# Patient Record
Sex: Male | Born: 1995 | Race: White | Hispanic: No | Smoking: Current every day smoker
Health system: Southern US, Community
[De-identification: ages and names within clinical notes are randomized; demographics above are authoritative.]

## PROBLEM LIST (undated history)

## (undated) DIAGNOSIS — F419 Anxiety disorder, unspecified: Secondary | ICD-10-CM

## (undated) DIAGNOSIS — R011 Cardiac murmur, unspecified: Secondary | ICD-10-CM

## (undated) DIAGNOSIS — K859 Acute pancreatitis without necrosis or infection, unspecified: Secondary | ICD-10-CM

---
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
## 2013-11-11 IMAGING — CR DG CHEST 2V
2 series · 2 of 2 positions shown · non-contrast
Comparison: None.

CLINICAL DATA: Chest pain.  No known injury.

CHEST - 2 VIEW

[view not recorded (1 of 2)]
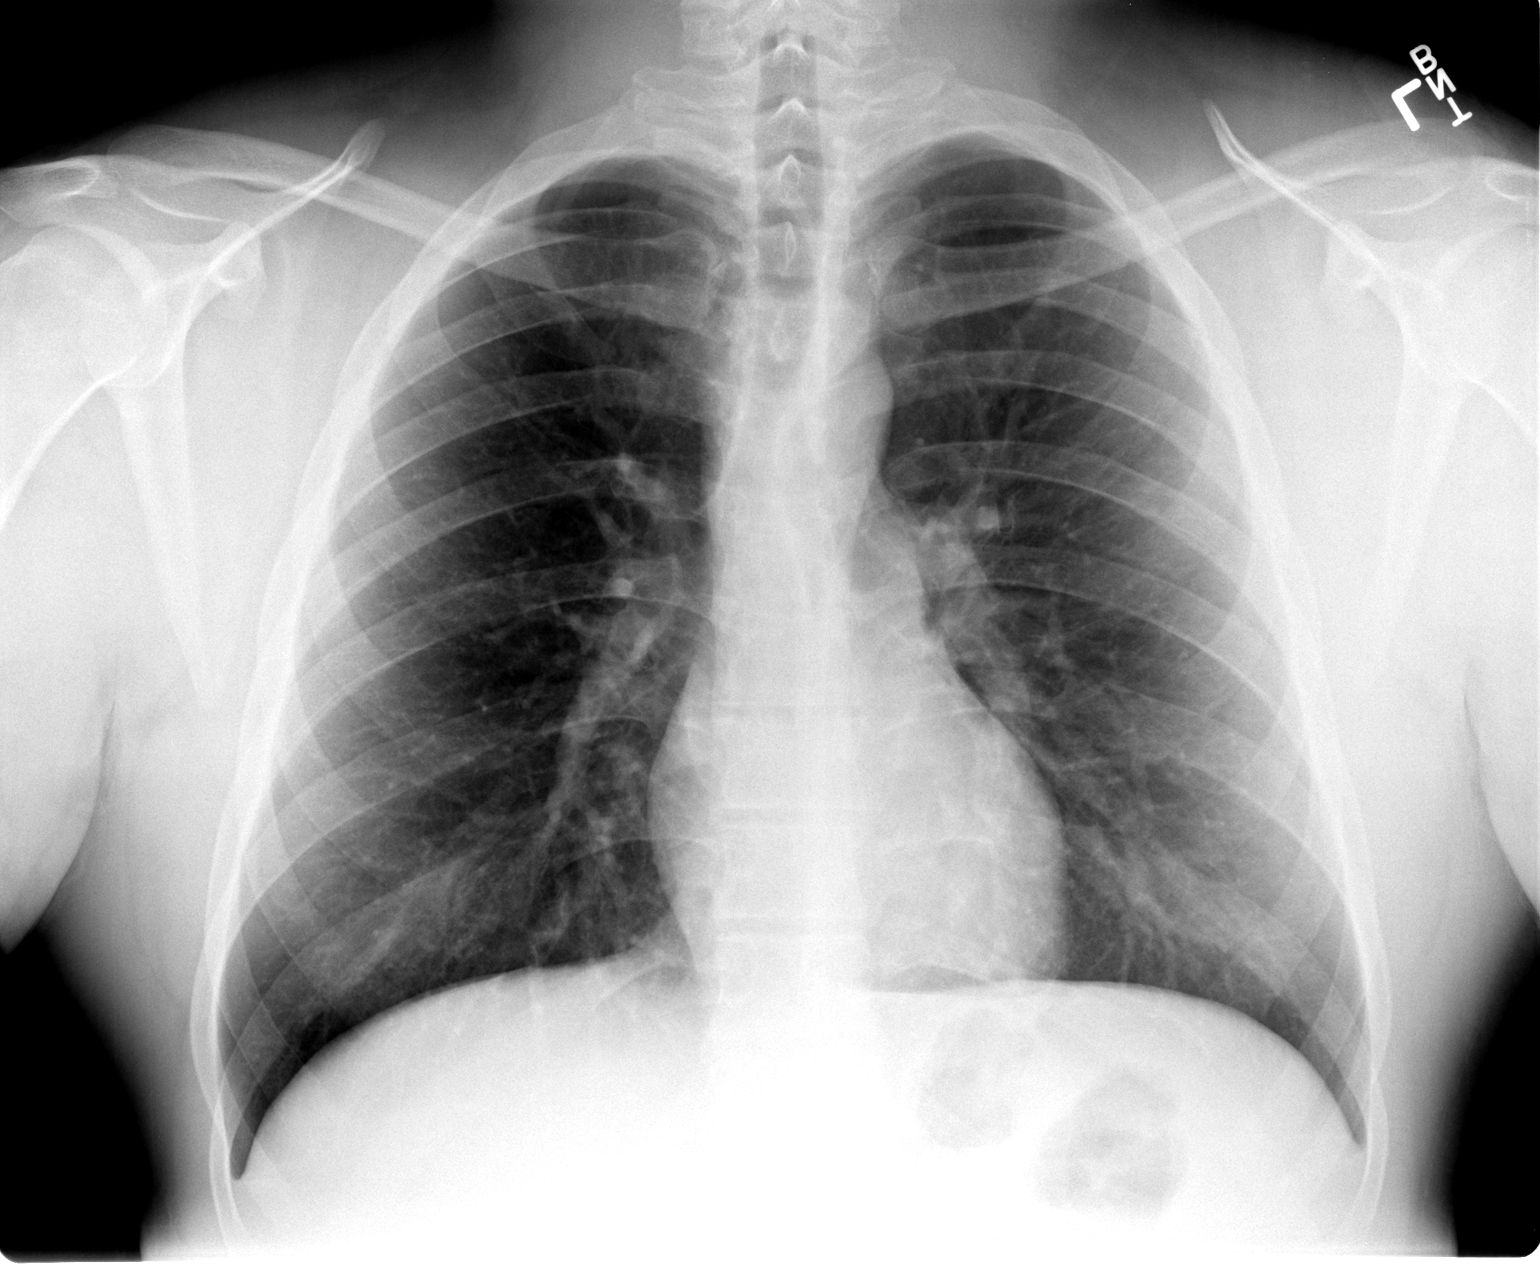

[view not recorded (2 of 2)]
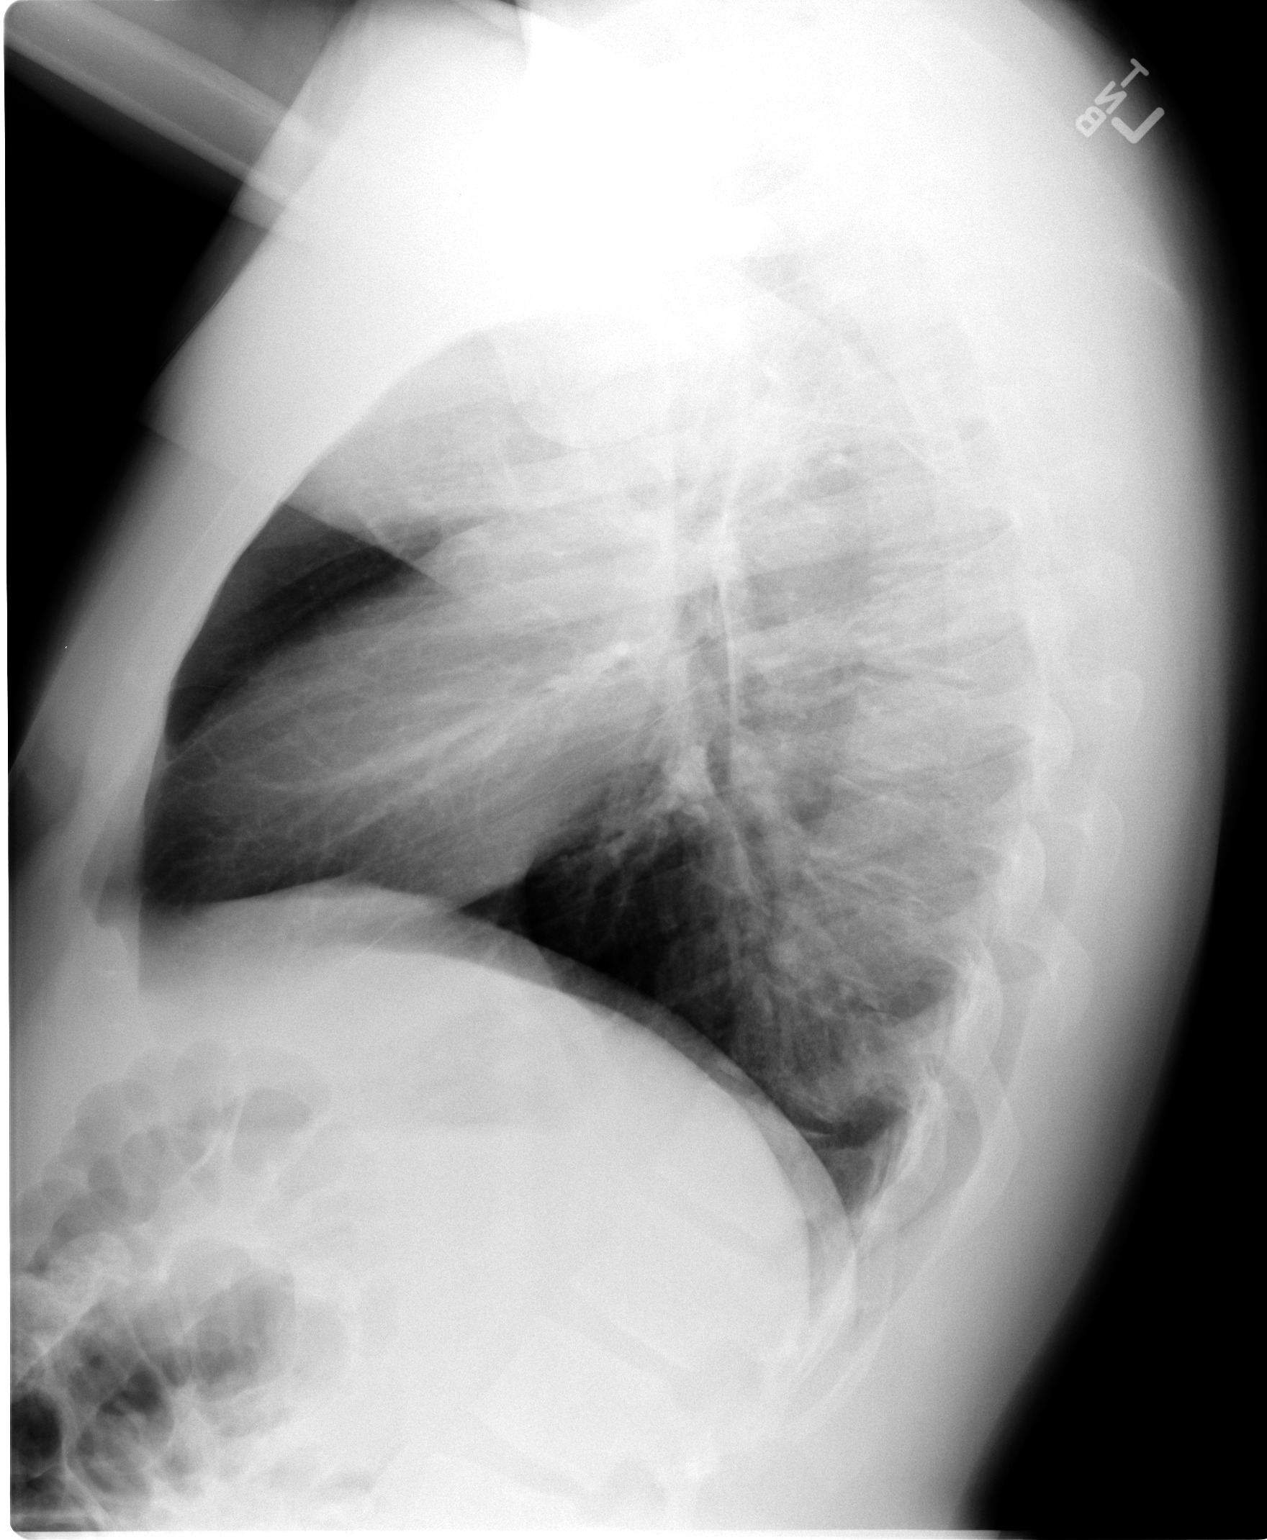

[2 of 2 positions shown; findings below may reference images not displayed]

FINDINGS: The heart, mediastinal, and hilar contours are normal.
The lungs are well-expanded and clear. Negative for pleural
effusion. The bony thorax is unremarkable.
IMPRESSION: No acute cardiopulmonary disease.

## 2021-05-01 ENCOUNTER — Encounter (HOSPITAL_COMMUNITY): Payer: Self-pay

## 2021-05-01 ENCOUNTER — Emergency Department (HOSPITAL_COMMUNITY)
Admission: EM | Admit: 2021-05-01 | Discharge: 2021-05-01 | Disposition: A | Discharge status: Home / Self Care | Payer: Self-pay | Attending: Emergency Medicine | Admitting: Emergency Medicine

## 2021-05-01 ENCOUNTER — Other Ambulatory Visit: Payer: Self-pay

## 2021-05-01 DIAGNOSIS — L539 Erythematous condition, unspecified: Secondary | ICD-10-CM | POA: Insufficient documentation

## 2021-05-01 DIAGNOSIS — K0889 Other specified disorders of teeth and supporting structures: Secondary | ICD-10-CM | POA: Insufficient documentation

## 2021-05-01 DIAGNOSIS — F419 Anxiety disorder, unspecified: Secondary | ICD-10-CM | POA: Insufficient documentation

## 2021-05-01 DIAGNOSIS — R000 Tachycardia, unspecified: Secondary | ICD-10-CM | POA: Insufficient documentation

## 2021-05-01 NOTE — ED Triage Notes (Signed)
Pt presents to ED with complaints of right side dental pain. Pt has seen dentist and told they think his wisdom teeth are infected, was referred to oral surgeon, has appointment April 14th. Pt started on Amoxicillin by dentist but concerned about infection spreading.  ?

## 2021-05-01 NOTE — Discharge Instructions (Addendum)
Return to the ED for new or worsening symptoms as discussed. ? ?Continue taking your antibiotic as instructed and continue using anti-inflammatories to help reduce pain and swelling. ? ?Contact your dentist for further antibiotic prescriptions as discussed or if tooth status worsens between now and your appointment in April ?

## 2021-05-01 NOTE — ED Provider Notes (Signed)
?[facility] ?Provider Note ? ? ?CSN: [id number] ?Arrival date & time: 05/01/21  1645 ? ?  ? ?History ? ?Chief Complaint  ?Patient presents with  ? Dental Pain  ? ? ?Bryan Edwards is a 26 y.o. anxious appearing male presenting today with come concerns about his infected wisdom tooth.  Started having symptoms on Sunday night of minor right-sided jaw swelling and tenderness.  Escalated over the last 2 days until he saw the dentist yesterday.  Was provided amoxicillin and instructed to take ibuprofen for inflammatory relief and pain relief.  Patient states he was concerned over the warning he received from the dentist that a untreated tooth infection can "go to my heart and kill me".  Patient states his neck now looks red and is concerned his infection is spreading.  Denies fever.  Denies recent upper respiratory illness.  Denies difficulty swallowing or painful swallowing.  Notes his range of motion of his jaw is somewhat limited.  Denies voice change or hoarseness.  Denies discharge from the tooth area. ? ?The history is provided by the patient, medical records and a parent.  ?Dental Pain ? ?  ? ?Home Medications ?Prior to Admission medications   ?Medication Sig Start Date End Date Taking? Authorizing Provider  ?LORazepam (ATIVAN) 0.5 MG tablet Take 0.5 mg by mouth daily as needed. anxiety    [provider]  ?   ? ?Allergies    ?Amoxicillin   ? ?Review of Systems   ?Review of Systems  ?HENT:  Positive for dental problem.   ?Psychiatric/Behavioral:  The patient is nervous/anxious.   ? ?Physical Exam ?Updated Vital Signs ?BP (!) 164/124 (BP Location: Right Arm)   Pulse (!) 103   Temp 98.6 ?F (37 ?C) (Oral)   Resp 14   Ht 5\' 10"  (1.778 m)   Wt 79.4 kg   SpO2 97%   BMI 25.11 kg/m?  ?Physical Exam ?Vitals and nursing note reviewed.  ?Constitutional:   ?   General: He is not in acute distress. ?   Appearance: Normal appearance. He is well-developed. He is not ill-appearing or  diaphoretic.  ?HENT:  ?   Head: Normocephalic and atraumatic.  ?   Nose: Nose normal. No congestion or rhinorrhea.  ?   Mouth/Throat:  ?   Mouth: Mucous membranes are moist.  ?   Pharynx: Oropharynx is clear. No oropharyngeal exudate or posterior oropharyngeal erythema.  ?   Comments: Mild restricted ROM of jaw ?Negative for jaw/TMJ tenderness ?Negative active pus or open wound in mouth ?Negative for visible blood ?Pharynx appears clear, without erythema or lesions ?Uvula not edematous and is midline ?Eyes:  ?   General: No scleral icterus. ?   Conjunctiva/sclera: Conjunctivae normal.  ?Cardiovascular:  ?   Rate and Rhythm: Regular rhythm. Tachycardia present.  ?   Pulses: Normal pulses.  ?   Comments: Pt appears anxious ?Pulmonary:  ?   Effort: Pulmonary effort is normal. No respiratory distress.  ?Abdominal:  ?   Palpations: Abdomen is soft.  ?   Tenderness: There is no abdominal tenderness.  ?Musculoskeletal:     ?   General: No swelling.  ?   Cervical back: Neck supple.  ?Skin: ?   General: Skin is warm and dry.  ?   Capillary Refill: Capillary refill takes less than 2 seconds.  ?   Coloration: Skin is not pale.  ?   Findings: Erythema (Superficial redness of neck where patient has been rubbing/pressing) present.  ?  Neurological:  ?   Mental Status: He is alert and oriented to person, place, and time.  ?   Sensory: No sensory deficit.  ?Psychiatric:     ?   Mood and Affect: Mood normal.  ? ? ?ED Results / Procedures / Treatments   ?Labs ?(all labs ordered are listed, but only abnormal results are displayed) ?Labs Reviewed - No data to display ? ?EKG ?None ? ?Radiology ?No results found. ? ?Procedures ?Procedures  ? ? ?Medications Ordered in ED ?Medications - No data to display ? ?ED Course/ Medical Decision Making/ A&P ?  ?                        ?Medical Decision Making ?Amount and/or Complexity of Data Reviewed ?External Data Reviewed: notes. ? ? ?26 y.o. anxious appearing male presenting today with come  concerns about his infected wisdom tooth.  Started having symptoms on Sunday night of minor right-sided jaw swelling and tenderness.  Escalated over the last 2 days until he saw the dentist yesterday.  Was provided amoxicillin and instructed to take ibuprofen for inflammatory relief and pain relief.  Patient states he was concerned over the warning he received from the dentist that a untreated tooth infection can "go to my heart and kill me".  Patient states his neck now looks red and is concerned his infection is spreading.  Denies fever.  Denies recent upper respiratory illness.  Denies difficulty swallowing or painful swallowing.  Notes his range of motion of his jaw is somewhat limited.  Denies voice change or hoarseness.  Denies discharge from the tooth area. ? ?Comorbidities that complicate the patient evaluation include anxiety.  Additional history obtained from internal/external records available via epic ? ?Interpretation: ?Test Considered: None ?  ?Critical Interventions: None ?  ?Consultations Obtained: None ? ?ED Course: ?Patient with toothache and tenderness of right jaw.  States this has been unchanged for the last 2 days.  Saw dentist yesterday afternoon who recommended wisdom teeth removal of near future.  Provided pt with antibiotics and recommended ice/rest/soft food diet, and possible second course of antibiotics if first course unsuccessful.  Pt began amoxicillin antibiotic almost 24 hours ago.  Denies palpitations, fever, active pus/discharge.  Denies worsening of symptoms.  Hx of anxiety.  No appreciation of active pus/discharge near affected area in oropharynx, significant swelling, gross abscess, or fever.  No evidence of systemic infection on physical exam or from pt history.  Pt well appearing, talking without difficulty, afebrile, mildly tachycardic and repetitively expresses his anxiety.  Tachycardia likely due to his anxiety.  Notes some improvement of symptoms with ice/rest.  Pt exam  unconcerning for Ludwig's angina or spread of infection.  Encouraged continued treatment with penicillin and anti-inflammatories medicine, which pt has at home.  Urged patient to follow-up with dentist.  Strict ED return precautions discussed at length. ? ?Emergency department workup does not suggest an emergent condition requiring admission or immediate intervention beyond  what has been performed at this time.  The patient is safe for discharge and has been instructed to return immediately for worsening symptoms, change in symptoms or any other concerns ? ?Disposition: ?I discussed the patient and their case with my attending, Dr. Jeraldine Loots, who agreed with the proposed treatment course.  After consideration of the diagnostic results and the patient's response to treatment, I feel that the patient would benefit from continued outpatient antibiotic and anti-inflammatory treatment with dentist follow-up.  Discussed course of treatment  thoroughly with the patient and his mother, whom demonstrated understanding.  Patient in agreement and has no further questions. ? ?This chart was dictated using voice recognition software.  Despite best efforts to proofread,  errors can occur which can change the documentation meaning. ? ? ? ? ? ? ? ? ?Final Clinical Impression(s) / ED Diagnoses ?Final diagnoses:  ?Pain, dental  ? ? ?Rx / DC Orders ?ED Discharge Orders   ? ? None  ? ?  ? ? ?  ?Cecil Cobbs, PA-C ?05/03/21 1135 ? ?  ?Gerhard Munch, MD ?05/03/21 2245 ? ?

## 2022-11-27 ENCOUNTER — Other Ambulatory Visit: Payer: Self-pay

## 2022-11-27 ENCOUNTER — Emergency Department (HOSPITAL_COMMUNITY)
Admission: EM | Admit: 2022-11-27 | Discharge: 2022-11-27 | Disposition: A | Discharge status: Home / Self Care | Payer: Self-pay | Attending: Student | Admitting: Student

## 2022-11-27 ENCOUNTER — Emergency Department (HOSPITAL_COMMUNITY): Payer: Self-pay

## 2022-11-27 ENCOUNTER — Encounter (HOSPITAL_COMMUNITY): Payer: Self-pay

## 2022-11-27 DIAGNOSIS — S299XXA Unspecified injury of thorax, initial encounter: Secondary | ICD-10-CM | POA: Diagnosis present

## 2022-11-27 DIAGNOSIS — S20211A Contusion of right front wall of thorax, initial encounter: Secondary | ICD-10-CM | POA: Diagnosis not present

## 2022-11-27 DIAGNOSIS — Y990 Civilian activity done for income or pay: Secondary | ICD-10-CM | POA: Insufficient documentation

## 2022-11-27 DIAGNOSIS — W010XXA Fall on same level from slipping, tripping and stumbling without subsequent striking against object, initial encounter: Secondary | ICD-10-CM | POA: Diagnosis not present

## 2022-11-27 MED ORDER — IBUPROFEN 600 MG PO TABS: 600.0000 mg | ORAL_TABLET | Freq: Four times a day (QID) | ORAL | 0 refills | Status: DC | PRN

## 2022-11-27 MED ORDER — IBUPROFEN 800 MG PO TABS
800.0000 mg | ORAL_TABLET | Freq: Once | ORAL | Status: AC
  Administered 2022-11-27: 800 mg via ORAL
  Filled 2022-11-27: qty 1

## 2022-11-27 NOTE — ED Triage Notes (Signed)
Pt reports he fell in the cooler at work on Sunday and the pain in his right rib has gotten increasingly worse.  Pt has elevate BP and heart rate in triage and pt insists he has high anxiety when he comes to the hospital and that always happens.

## 2022-11-27 NOTE — Discharge Instructions (Addendum)
You have been evaluated for your symptoms.  Fortunately no evidence of any broken bones were noted on x-ray of your chest.  Sometimes small break can be missed on x-ray.  Take ibuprofen as needed for pain control.  Use a pillow to press against the chest when coughing to decrease chest discomfort.  If you develop significant shortness of breath, coughing up blood, return to the ER for further evaluation.

## 2022-11-27 NOTE — ED Provider Notes (Signed)
[place] EMERGENCY DEPARTMENT AT [facility] Provider Note   CSN: [id number] Arrival date & time: 11/27/22  1240     History  Chief Complaint  Patient presents with   Rib Injury    Bryan Edwards is a 27 y.o. male.  The history is provided by the patient and medical records. No language interpreter was used.     27 year old male with significant history of anxiety presenting for evaluation of chest wall injury.  Patient report 4 days ago he fell at work in the cooler and injured his right ribs.  The patient states he was in the cooler, slipped and fell towards the wall striking his chest wall against a hard surface.  He denies hitting his head or loss of consciousness.  States that he was quite busy on that day and did not pay much attention to his pain however for the past 2 days he noticed increasing pain to the right side of his chest.  He described pain as a sharp stabbing sensation worse with palpation but not so much with breathing.  Pain is moderate to severe and he tried to sleep it off without relief.  Does not endorse any abdominal pain no back pain no denies any hematuria or coughing up blood denies any significant shortness of breath.  Denies any other injury.  Home Medications Prior to Admission medications   Medication Sig Start Date End Date Taking? Authorizing Provider  LORazepam (ATIVAN) 0.5 MG tablet Take 0.5 mg by mouth daily as needed. anxiety    [provider]      Allergies    Amoxicillin    Review of Systems   Review of Systems  All other systems reviewed and are negative.   Physical Exam Updated Vital Signs BP (!) 156/116 (BP Location: Left Arm)   Pulse (!) 120   Temp 98.7 F (37.1 C) (Oral)   Resp 16   Ht 5\' 10"  (1.778 m)   Wt 63.5 kg   SpO2 98%   BMI 20.09 kg/m  Physical Exam Vitals and nursing note reviewed.  Constitutional:      General: He is not in acute distress.    Appearance: He is well-developed.  HENT:      Head: Atraumatic.  Eyes:     Conjunctiva/sclera: Conjunctivae normal.  Cardiovascular:     Rate and Rhythm: Tachycardia present.     Pulses: Normal pulses.     Heart sounds: Normal heart sounds.  Pulmonary:     Effort: Pulmonary effort is normal.     Breath sounds: Normal breath sounds.  Chest:     Chest wall: Tenderness (Tenderness to right anterior lateral inferior chest wall with faint bruising noted but no crepitus no emphysema) present.  Abdominal:     Palpations: Abdomen is soft.     Tenderness: There is no abdominal tenderness. There is no right CVA tenderness or left CVA tenderness.  Musculoskeletal:     Cervical back: Neck supple.  Skin:    Findings: No rash.  Neurological:     Mental Status: He is alert.     ED Results / Procedures / Treatments   Labs (all labs ordered are listed, but only abnormal results are displayed) Labs Reviewed - No data to display  EKG None  Radiology No results found.  Procedures Procedures    Medications Ordered in ED Medications - No data to display  ED Course/ Medical Decision Making/ A&P  Medical Decision Making Amount and/or Complexity of Data Reviewed Radiology: ordered.  Risk Prescription drug management.   BP (!) 156/116 (BP Location: Left Arm)   Pulse (!) 120   Temp 98.7 F (37.1 C) (Oral)   Resp 16   Ht 5\' 10"  (1.778 m)   Wt 63.5 kg   SpO2 98%   BMI 20.09 kg/m   44:1 PM  27 year old male with significant history of anxiety presenting for evaluation of chest wall injury.  Patient report 4 days ago he fell at work in the cooler and injured his right ribs.  The patient states he was in the cooler, slipped and fell towards the wall striking his chest wall against a hard surface.  He denies hitting his head or loss of consciousness.  States that he was quite busy on that day and did not pay much attention to his pain however for the past 2 days he noticed increasing pain to the  right side of his chest.  He described pain as a sharp stabbing sensation worse with palpation but not so much with breathing.  Pain is moderate to severe and he tried to sleep it off without relief.  Does not endorse any abdominal pain no back pain no denies any hematuria or coughing up blood denies any significant shortness of breath.  Denies any other injury.  On exam, patient has tenderness to right anterior lateral chest wall with faint bruising noted but no crepitus or emphysema.  Lung sounds present on both side.  He is mildly tachycardic likely secondary to pain.  Will give ibuprofen, x-rays ordered.  DDx: Costochondritis, rib fracture, pneumothorax, liver laceration, kidney laceration, intra abdominal injury, contusion   3:33 PM X-ray of the ribs obtained and reviewed interpreted by me I agree with radiology interpretation.  X-ray is without any concerning finding.  Patient provided with supportive care but otherwise he is stable for discharge.  Low suspicion for abdominal injury.  Patient discharged home with ibuprofen as needed for pain.  Social determinant of health including tobacco use.        Final Clinical Impression(s) / ED Diagnoses Final diagnoses:  Chest wall contusion, right, initial encounter    Rx / DC Orders ED Discharge Orders          Ordered    ibuprofen (ADVIL) 600 MG tablet  Every 6 hours PRN        11/27/22 1532              Fayrene Helper, PA-C 11/27/22 1535    Kommor, [place], MD 11/27/22 2240

## 2022-11-27 NOTE — ED Notes (Signed)
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 Noted bruising to RIGHT side Pt stated it feels like someone is stabbing him Occasionally hurts to take a deep breath, pt is a smoker.   Vitals listed Waiting on XRAY results

## 2023-01-09 ENCOUNTER — Inpatient Hospital Stay (HOSPITAL_COMMUNITY)
Admission: EM | Admit: 2023-01-09 | Discharge: 2023-01-15 | DRG: 439 | Disposition: A | Discharge status: Home / Self Care | Payer: Medicaid Other | Attending: Internal Medicine | Admitting: Internal Medicine

## 2023-01-09 ENCOUNTER — Encounter (HOSPITAL_COMMUNITY): Payer: Self-pay

## 2023-01-09 ENCOUNTER — Emergency Department (HOSPITAL_COMMUNITY): Payer: Medicaid Other

## 2023-01-09 ENCOUNTER — Other Ambulatory Visit: Payer: Self-pay

## 2023-01-09 DIAGNOSIS — I82890 Acute embolism and thrombosis of other specified veins: Secondary | ICD-10-CM | POA: Diagnosis present

## 2023-01-09 DIAGNOSIS — F17210 Nicotine dependence, cigarettes, uncomplicated: Secondary | ICD-10-CM | POA: Diagnosis present

## 2023-01-09 DIAGNOSIS — I10 Essential (primary) hypertension: Secondary | ICD-10-CM | POA: Diagnosis present

## 2023-01-09 DIAGNOSIS — F10939 Alcohol use, unspecified with withdrawal, unspecified: Secondary | ICD-10-CM | POA: Insufficient documentation

## 2023-01-09 DIAGNOSIS — K760 Fatty (change of) liver, not elsewhere classified: Secondary | ICD-10-CM | POA: Diagnosis present

## 2023-01-09 DIAGNOSIS — R000 Tachycardia, unspecified: Secondary | ICD-10-CM | POA: Diagnosis present

## 2023-01-09 DIAGNOSIS — K852 Alcohol induced acute pancreatitis without necrosis or infection: Principal | ICD-10-CM | POA: Diagnosis present

## 2023-01-09 DIAGNOSIS — Z91030 Bee allergy status: Secondary | ICD-10-CM | POA: Diagnosis not present

## 2023-01-09 DIAGNOSIS — J90 Pleural effusion, not elsewhere classified: Secondary | ICD-10-CM | POA: Diagnosis not present

## 2023-01-09 DIAGNOSIS — Z880 Allergy status to penicillin: Secondary | ICD-10-CM | POA: Diagnosis not present

## 2023-01-09 DIAGNOSIS — K828 Other specified diseases of gallbladder: Secondary | ICD-10-CM | POA: Diagnosis present

## 2023-01-09 DIAGNOSIS — F10139 Alcohol abuse with withdrawal, unspecified: Secondary | ICD-10-CM | POA: Diagnosis present

## 2023-01-09 DIAGNOSIS — K859 Acute pancreatitis without necrosis or infection, unspecified: Principal | ICD-10-CM

## 2023-01-09 DIAGNOSIS — Z720 Tobacco use: Secondary | ICD-10-CM | POA: Insufficient documentation

## 2023-01-09 LAB — URINALYSIS, ROUTINE W REFLEX MICROSCOPIC
Bacteria, UA: NONE SEEN
Bilirubin Urine: NEGATIVE
Glucose, UA: NEGATIVE mg/dL
Hgb urine dipstick: NEGATIVE
Ketones, ur: 20 mg/dL — AB
Leukocytes,Ua: NEGATIVE
Nitrite: NEGATIVE
Protein, ur: 100 mg/dL — AB
Specific Gravity, Urine: 1.021 (ref 1.005–1.030)
pH: 6 (ref 5.0–8.0)

## 2023-01-09 LAB — COMPREHENSIVE METABOLIC PANEL
ALT: 52 U/L — ABNORMAL HIGH (ref 0–44)
AST: 67 U/L — ABNORMAL HIGH (ref 15–41)
Albumin: 5 g/dL (ref 3.5–5.0)
Alkaline Phosphatase: 90 U/L (ref 38–126)
Anion gap: 15 (ref 5–15)
BUN: 11 mg/dL (ref 6–20)
CO2: 26 mmol/L (ref 22–32)
Calcium: 9.6 mg/dL (ref 8.9–10.3)
Chloride: 96 mmol/L — ABNORMAL LOW (ref 98–111)
Creatinine, Ser: 0.8 mg/dL (ref 0.61–1.24)
GFR, Estimated: >60 mL/min (ref >60)
Glucose, Bld: 108 mg/dL — ABNORMAL HIGH (ref 70–99)
Potassium: 3.6 mmol/L (ref 3.5–5.1)
Sodium: 137 mmol/L (ref 135–145)
Total Bilirubin: 1.3 mg/dL — ABNORMAL HIGH (ref <1.2)
Total Protein: 8 g/dL (ref 6.5–8.1)

## 2023-01-09 LAB — RAPID URINE DRUG SCREEN, HOSP PERFORMED
Amphetamines: NOT DETECTED
Barbiturates: NOT DETECTED
Benzodiazepines: NOT DETECTED
Cocaine: NOT DETECTED
Opiates: NOT DETECTED
Tetrahydrocannabinol: NOT DETECTED

## 2023-01-09 LAB — LIPASE, BLOOD: Lipase: 2735 U/L — ABNORMAL HIGH (ref 11–51)

## 2023-01-09 LAB — CBC
HCT: 53.4 % — ABNORMAL HIGH (ref 39.0–52.0)
Hemoglobin: 18.3 g/dL — ABNORMAL HIGH (ref 13.0–17.0)
MCH: 32.4 pg (ref 26.0–34.0)
MCHC: 34.3 g/dL (ref 30.0–36.0)
MCV: 94.7 fL (ref 80.0–100.0)
Platelets: 159 10*3/uL (ref 150–400)
RBC: 5.64 MIL/uL (ref 4.22–5.81)
RDW: 12.2 % (ref 11.5–15.5)
WBC: 17.9 10*3/uL — ABNORMAL HIGH (ref 4.0–10.5)
nRBC: 0 % (ref 0.0–0.2)

## 2023-01-09 LAB — ETHANOL: Alcohol, Ethyl (B): <10 mg/dL (ref <10)

## 2023-01-09 MED ORDER — HYDRALAZINE HCL 20 MG/ML IJ SOLN: 5.0000 mg | INTRAMUSCULAR | Status: DC | PRN

## 2023-01-09 MED ORDER — FOLIC ACID 1 MG PO TABS
1.0000 mg | ORAL_TABLET | Freq: Every day | ORAL | Status: DC
  Administered 2023-01-09 – 2023-01-15 (×7): 1 mg via ORAL
  Filled 2023-01-09 (×7): qty 1

## 2023-01-09 MED ORDER — ONDANSETRON HCL 4 MG/2ML IJ SOLN
4.0000 mg | Freq: Once | INTRAMUSCULAR | Status: AC
  Administered 2023-01-09: 4 mg via INTRAVENOUS
  Filled 2023-01-09: qty 2

## 2023-01-09 MED ORDER — ALBUTEROL SULFATE (2.5 MG/3ML) 0.083% IN NEBU: 2.5000 mg | INHALATION_SOLUTION | RESPIRATORY_TRACT | Status: DC | PRN

## 2023-01-09 MED ORDER — THIAMINE HCL 100 MG/ML IJ SOLN
100.0000 mg | Freq: Every day | INTRAMUSCULAR | Status: DC
  Administered 2023-01-09: 100 mg via INTRAVENOUS
  Filled 2023-01-09 (×2): qty 2

## 2023-01-09 MED ORDER — ONDANSETRON HCL 4 MG/2ML IJ SOLN
4.0000 mg | Freq: Four times a day (QID) | INTRAMUSCULAR | Status: DC | PRN
  Administered 2023-01-09 – 2023-01-10 (×2): 4 mg via INTRAVENOUS
  Filled 2023-01-09 (×2): qty 2

## 2023-01-09 MED ORDER — IOHEXOL 300 MG/ML  SOLN
100.0000 mL | Freq: Once | INTRAMUSCULAR | Status: AC | PRN
  Administered 2023-01-09: 100 mL via INTRAVENOUS

## 2023-01-09 MED ORDER — LORAZEPAM 2 MG/ML IJ SOLN
1.0000 mg | Freq: Once | INTRAMUSCULAR | Status: AC
  Administered 2023-01-09: 1 mg via INTRAVENOUS
  Filled 2023-01-09: qty 1

## 2023-01-09 MED ORDER — ENOXAPARIN SODIUM 40 MG/0.4ML IJ SOSY
40.0000 mg | PREFILLED_SYRINGE | INTRAMUSCULAR | Status: DC
  Administered 2023-01-09 – 2023-01-13 (×5): 40 mg via SUBCUTANEOUS
  Filled 2023-01-09 (×5): qty 0.4

## 2023-01-09 MED ORDER — LORAZEPAM 2 MG/ML IJ SOLN
1.0000 mg | INTRAMUSCULAR | Status: AC | PRN
  Administered 2023-01-10 – 2023-01-12 (×7): 2 mg via INTRAVENOUS
  Filled 2023-01-09 (×9): qty 1

## 2023-01-09 MED ORDER — THIAMINE MONONITRATE 100 MG PO TABS
100.0000 mg | ORAL_TABLET | Freq: Every day | ORAL | Status: DC
  Administered 2023-01-09 – 2023-01-15 (×7): 100 mg via ORAL
  Filled 2023-01-09 (×7): qty 1

## 2023-01-09 MED ORDER — HYDROMORPHONE HCL 1 MG/ML IJ SOLN
1.0000 mg | Freq: Once | INTRAMUSCULAR | Status: AC
  Administered 2023-01-09: 1 mg via INTRAVENOUS
  Filled 2023-01-09: qty 1

## 2023-01-09 MED ORDER — ADULT MULTIVITAMIN W/MINERALS CH
1.0000 | ORAL_TABLET | Freq: Every day | ORAL | Status: DC
  Administered 2023-01-09 – 2023-01-15 (×7): 1 via ORAL
  Filled 2023-01-09 (×7): qty 1

## 2023-01-09 MED ORDER — CHLORDIAZEPOXIDE HCL 5 MG PO CAPS
10.0000 mg | ORAL_CAPSULE | Freq: Four times a day (QID) | ORAL | Status: DC
  Administered 2023-01-09 – 2023-01-10 (×2): 10 mg via ORAL
  Filled 2023-01-09 (×2): qty 2

## 2023-01-09 MED ORDER — ONDANSETRON HCL 4 MG PO TABS
4.0000 mg | ORAL_TABLET | Freq: Four times a day (QID) | ORAL | Status: DC | PRN
  Administered 2023-01-10: 4 mg via ORAL
  Filled 2023-01-09: qty 1

## 2023-01-09 MED ORDER — LACTATED RINGERS IV BOLUS
1000.0000 mL | Freq: Once | INTRAVENOUS | Status: AC
  Administered 2023-01-09: 1000 mL via INTRAVENOUS

## 2023-01-09 MED ORDER — LACTATED RINGERS IV SOLN: INTRAVENOUS | Status: AC

## 2023-01-09 MED ORDER — MORPHINE SULFATE (PF) 2 MG/ML IV SOLN
2.0000 mg | INTRAVENOUS | Status: DC | PRN
  Administered 2023-01-09 – 2023-01-10 (×3): 2 mg via INTRAVENOUS
  Filled 2023-01-09 (×3): qty 1

## 2023-01-09 MED ORDER — CLONIDINE HCL 0.1 MG PO TABS
0.1000 mg | ORAL_TABLET | Freq: Three times a day (TID) | ORAL | Status: DC
  Administered 2023-01-09: 0.1 mg via ORAL
  Filled 2023-01-09 (×2): qty 1

## 2023-01-09 MED ORDER — SODIUM CHLORIDE 0.9 % IV BOLUS
1000.0000 mL | Freq: Once | INTRAVENOUS | Status: AC
Start: 2023-01-09 — End: 2023-01-09
  Administered 2023-01-09: 1000 mL via INTRAVENOUS

## 2023-01-09 MED ORDER — NICOTINE 21 MG/24HR TD PT24
21.0000 mg | MEDICATED_PATCH | Freq: Every day | TRANSDERMAL | Status: DC
  Administered 2023-01-10 – 2023-01-15 (×6): 21 mg via TRANSDERMAL
  Filled 2023-01-09 (×6): qty 1

## 2023-01-09 MED ORDER — LORAZEPAM 1 MG PO TABS
1.0000 mg | ORAL_TABLET | ORAL | Status: AC | PRN
  Administered 2023-01-10 – 2023-01-12 (×2): 1 mg via ORAL
  Filled 2023-01-09 (×2): qty 1

## 2023-01-09 MED ORDER — MORPHINE SULFATE (PF) 2 MG/ML IV SOLN
2.0000 mg | Freq: Once | INTRAVENOUS | Status: AC
  Administered 2023-01-09: 2 mg via INTRAVENOUS
  Filled 2023-01-09: qty 1

## 2023-01-09 NOTE — ED Provider Notes (Signed)
  Physical Exam  BP (!) 148/111   Pulse 95   Temp 97.8 F (36.6 C) (Oral)   Resp 16   Ht 5\' 10"  (1.778 m)   Wt 65.8 kg   SpO2 94%   BMI 20.81 kg/m   Physical Exam Vitals and nursing note reviewed.  Constitutional:      General: He is not in acute distress. HENT:     Head: Normocephalic and atraumatic.  Eyes:     Conjunctiva/sclera: Conjunctivae normal.  Cardiovascular:     Rate and Rhythm: Regular rhythm. Tachycardia present.  Pulmonary:     Effort: No respiratory distress.  Abdominal:     Palpations: Abdomen is soft.     Tenderness: There is abdominal tenderness in the epigastric area.  Musculoskeletal:        General: No swelling.     Cervical back: Neck supple.  Skin:    General: Skin is warm and dry.     Capillary Refill: Capillary refill takes less than 2 seconds.  Psychiatric:        Mood and Affect: Mood normal.     Procedures  Procedures  ED Course / MDM    Medical Decision Making Amount and/or Complexity of Data Reviewed Labs: ordered. Radiology: ordered.  Risk Prescription drug management. Decision regarding hospitalization.  This patient's care was assumed by me at shift change.  Patient here for abdominal pain, not tolerating p.o.  Notable lab findings are lipase of 2735 and white blood cell count of 17.9.  The tests pending are CT abdomen pelvis. Plan is for admit to hospitalist after results for pancreatitis   Patient was re-evaluated by me as well. I discussed their result with them and plan is for admission.  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  The longest he has ever gone without drinking is 3 months.  He states when he does not drink he starts to get shakes and anxiety but has never had seizures or hallucinations or need to be hospitalized.  Last drink was last night.  He tried to have a shot this morning,  but immediately vomited it back up.  He is complaining of some  anxiety and mild shaking.   CT results show diffuse interstitial pancreatitis with significant peripancreatic fluid and a small thrombus in the splenic vein.   Discussed with hospitalist Dr. Randol Kern who is agreeable with admission.  Will defer any anticoagulation for GI.  No need for immediate anticoagulation.        Josem Kaufmann 01/09/23 2308    Vanetta Mulders, MD 01/13/23 [phone]

## 2023-01-09 NOTE — ED Provider Notes (Signed)
[place] EMERGENCY DEPARTMENT AT [facility] Provider Note   CSN: [id number] Arrival date & time: 01/09/23  1256     History  Chief Complaint  Patient presents with   Abdominal Pain    Bryan Edwards is a 27 y.o. male with past medical history of anxiety presents to emergency department for evaluation of worsening abdominal pain and intractable vomiting over the past 3 weeks. He reports inability to maintain oral hydration d/t N/V. He reports intermittent diarrhea with last BM yesterday.  He endorses "social" alcohol use. He denies blood in vomit,  hematochezia, fevers, drug use.   Abdominal Pain Associated symptoms: nausea and vomiting   Associated symptoms: no chest pain, no chills, no constipation, no cough, no diarrhea, no fatigue, no fever and no shortness of breath       Home Medications Prior to Admission medications   Medication Sig Start Date End Date Taking? Authorizing Provider  ibuprofen (ADVIL) 600 MG tablet Take 1 tablet (600 mg total) by mouth every 6 (six) hours as needed. 11/27/22   Fayrene Helper, PA-C  LORazepam (ATIVAN) 0.5 MG tablet Take 0.5 mg by mouth daily as needed. anxiety    [provider]      Allergies    Amoxicillin and Penicillins    Review of Systems   Review of Systems  Constitutional:  Negative for chills, fatigue and fever.  Respiratory:  Negative for cough, chest tightness, shortness of breath and wheezing.   Cardiovascular:  Negative for chest pain and palpitations.  Gastrointestinal:  Positive for abdominal pain, nausea and vomiting. Negative for constipation and diarrhea.  Neurological:  Negative for dizziness, seizures, weakness, light-headedness, numbness and headaches.    Physical Exam Updated Vital Signs BP (!) 151/100 (BP Location: Left Arm)   Pulse 100   Temp 97.8 F (36.6 C) (Oral)   Resp 16   Ht 5\' 10"  (1.778 m)   Wt 65.8 kg   SpO2 93%   BMI 20.81 kg/m  Physical Exam Vitals and nursing note  reviewed.  Constitutional:      General: He is not in acute distress.    Appearance: Normal appearance. He is ill-appearing.  HENT:     Head: Normocephalic and atraumatic.  Eyes:     General: No scleral icterus.       Right eye: No discharge.        Left eye: No discharge.     Conjunctiva/sclera: Conjunctivae normal.  Cardiovascular:     Rate and Rhythm: Normal rate.     Pulses: Normal pulses.  Pulmonary:     Effort: Pulmonary effort is normal. No respiratory distress.     Breath sounds: Normal breath sounds.  Chest:     Chest wall: No tenderness.  Abdominal:     General: Bowel sounds are normal. There is no distension.     Palpations: Abdomen is soft. There is no mass.     Tenderness: There is generalized abdominal tenderness. There is guarding. There is no right CVA tenderness or left CVA tenderness.  Musculoskeletal:     Right lower leg: No edema.     Left lower leg: No edema.  Skin:    General: Skin is warm.     Capillary Refill: Capillary refill takes less than 2 seconds.     Coloration: Skin is not jaundiced or pale.     Comments: No Cullen or Turner sign  Neurological:     Mental Status: He is alert and oriented to person,  place, and time. Mental status is at baseline.     Sensory: No sensory deficit.    ED Results / Procedures / Treatments   Labs (all labs ordered are listed, but only abnormal results are displayed) Labs Reviewed  LIPASE, BLOOD - Abnormal; Notable for the following components:      Result Value   Lipase 2,735 (*)    All other components within normal limits  COMPREHENSIVE METABOLIC PANEL - Abnormal; Notable for the following components:   Chloride 96 (*)    Glucose, Bld 108 (*)    AST 67 (*)    ALT 52 (*)    Total Bilirubin 1.3 (*)    All other components within normal limits  CBC - Abnormal; Notable for the following components:   WBC 17.9 (*)    Hemoglobin 18.3 (*)    HCT 53.4 (*)    All other components within normal limits   URINALYSIS, ROUTINE W REFLEX MICROSCOPIC - Abnormal; Notable for the following components:   Ketones, ur 20 (*)    Protein, ur 100 (*)    All other components within normal limits  ETHANOL  RAPID URINE DRUG SCREEN, HOSP PERFORMED    EKG None  Radiology No results found.  Procedures Procedures    Medications Ordered in ED Medications  sodium chloride 0.9 % bolus 1,000 mL (0 mLs Intravenous Stopped 01/09/23 1824)  ondansetron (ZOFRAN) injection 4 mg (4 mg Intravenous Given 01/09/23 1723)  morphine (PF) 2 MG/ML injection 2 mg (2 mg Intravenous Given 01/09/23 1725)  iohexol (OMNIPAQUE) 300 MG/ML solution 100 mL (100 mLs Intravenous Contrast Given 01/09/23 1803)  HYDROmorphone (DILAUDID) injection 1 mg (1 mg Intravenous Given 01/09/23 1833)    ED Course/ Medical Decision Making/ A&P                                 Medical Decision Making Amount and/or Complexity of Data Reviewed Labs: ordered. Radiology: ordered.  Risk Prescription drug management.   Patient presents to the ED for concern of nominal pain and intractable vomiting for the past 3 weeks, this involves an extensive number of treatment options, and is a complaint that carries with it a high risk of complications and morbidity.  The differential diagnosis includes gastroenteritis, obstruction, pancreatitis, IBD, IBS. This is not an exhaustive list.   Co morbidities that complicate the patient evaluation  Anxiety   Additional history obtained:  Additional history obtained from Family, Nursing, and Outside Medical Records   External records from outside source obtained and reviewed including RN triage note   Lab Tests:  I Ordered, and personally interpreted labs.  The pertinent results include: Lipase 2,735 leukocytosis (17.9) and elevated hemoglobin (18.3) however likely significantly elevated due to dehydration.  Mild transaminitis (AST 67 ALT 52)  Imaging Studies ordered:  I ordered imaging  studies including CT abdomen pelvis with contrast Pending at sign out   Medicines ordered and prescription drug management:  I ordered medication including morphine, dilaudid, Zofran, IVF for pain and vomiting Reevaluation of the patient after these medicines showed that the patient improved I have reviewed the patients home medicines and have made adjustments as needed   Problem List / ED Course:  Abdominal pain Vomiting   Reevaluation:  After the interventions noted above, I reevaluated the patient and found that they have :improved   Dispostion:  Patient is actively retching at assessment.  Vital signs significant for hypertension  at 137/103 but afebrile.  See HPI  Physical exam is significant for diffuse abdominal tenderness with light palpation and guarding.  Provided morphine, Zofran, and dilaudid for pain and nausea.  Lab work is significant for lipase 2735.  UA neg for infection. Will obtain CT.  Sign out to [place] PA pending CT. If vomiting and pain cannot be controlled and/or CT has concerning findings, pt will have to be admitted.         Final Clinical Impression(s) / ED Diagnoses Final diagnoses:  Acute pancreatitis, unspecified complication status, unspecified pancreatitis type    Rx / DC Orders ED Discharge Orders     None         Judithann Sheen, PA 01/09/23 Salley Scarlet, MD 01/13/23 [phone]

## 2023-01-09 NOTE — ED Triage Notes (Signed)
N/v/d x2 weeks ABD pain that radiates through to back  Urination is normal

## 2023-01-09 NOTE — H&P (Signed)
TRH H&P   Patient Demographics:    Bryan Edwards, is a 27 y.o. male  MRN: [id number]   DOB - [birth year]  Admit Date - 01/09/2023  Outpatient Primary MD for the patient is Pcp, No  Referring MD/NP/PA: PA Celeste   Patient coming from: home  Chief Complaint  Patient presents with   Abdominal Pain      HPI:    Bryan Edwards  is a 27 y.o. male, past medical history of heavy alcohol abuse, tobacco abuse, patient presents to ED secondary to complaints of nausea, vomiting and abdominal pain, for last 3 weeks, patient reports patient started gradually, he was able to keep some food, and he kept drinking, but had much progressed recently, reports started to vomit yesterday, but he had multiple episodes of vomiting today, he does report abdominal pain as well, patient reports history of heavy alcohol abuse, he has been drinking for last 9 years, multiple beers and shots every day, reports this morning he was unable to keep the shot where he did vomited, he came to ED for further evaluation, he denies fever, chills, diarrhea, constipation, sick contacts. -In ED patient was noted to be tachycardic, hypertensive, labs were significant for elevated lipase at 2735, AST elevated at 67, ALT elevated at 52, and his total bili at 1.3, alk phos within normal limit at 90, white blood cell count elevated at 17.9 K, but his urine is negative, the abdomen pelvis significant for acute pancreatitis, splenic vein thrombosis, no evidence of cholelithiasis, bile duct within normal limit, but questionable gallstone, [facility] hospitalist consulted to admit.    Review of systems:      A full 10 point Review of Systems was done, except as stated above, all other Review of Systems were negative.   With Past History of the following :    Past Medical History:  Diagnosis Date   Anxiety    Heart murmur        History reviewed. No pertinent surgical history.    Social History:     Social History   Tobacco Use   Smoking status: Every Day    Current packs/day: 1.00    Types: Cigarettes   Smokeless tobacco: Not on file  Substance Use Topics   Alcohol use: Not Currently    Comment: few times weekly        Family History :    History reviewed. No pertinent family history.    Home Medications:   Prior to Admission medications   Medication Sig Start Date End Date Taking? Authorizing Provider  Multiple Vitamin (MULTIVITAMIN) tablet Take 1 tablet by mouth daily.   Yes [provider]     Allergies:     Allergies  Allergen Reactions   Amoxicillin Other (See Comments)    Makes tongue and throat hurt, dries it out   Bee Pollen Other (See Comments)  Seasonal allergies   Penicillins Swelling     Physical Exam:   Vitals  Blood pressure (!) 149/94, pulse (!) 101, temperature 97.8 F (36.6 C), temperature source Oral, resp. rate 19, height 5\' 10"  (1.778 m), weight 65.8 kg, SpO2 94%.   1. General Male, laying in bed, restless, has tremors  2. Normal affect and insight, Not Suicidal or Homicidal, Awake Alert, Oriented X 3.  3. No F.N deficits, ALL C.Nerves Intact, Strength 5/5 all 4 extremities, Sensation intact all 4 extremities, Plantars down going.  4. Ears and Eyes appear Normal, Conjunctivae clear, PERRLA.  Dry oral mucosa  5. Supple Neck, No JVD, No cervical lymphadenopathy appriciated, No Carotid Bruits.  6. Symmetrical Chest wall movement, Good air movement bilaterally, CTAB.  7.  Cardiac, No Gallops, Rubs or Murmurs, No Parasternal Heave.  8. Positive Bowel Sounds, Abdomen Soft, No tenderness, No organomegaly appriciated,No rebound -guarding or rigidity.  9.  No Cyanosis, Normal Skin Turgor, No Skin Rash or Bruise.  10. Good muscle tone,  joints appear normal , no effusions, Normal ROM.     Data Review:    CBC Recent Labs  Lab  01/09/23 1419  WBC 17.9*  HGB 18.3*  HCT 53.4*  PLT 159  MCV 94.7  MCH 32.4  MCHC 34.3  RDW 12.2   ------------------------------------------------------------------------------------------------------------------  Chemistries  Recent Labs  Lab 01/09/23 1419  NA 137  K 3.6  CL 96*  CO2 26  GLUCOSE 108*  BUN 11  CREATININE 0.80  CALCIUM 9.6  AST 67*  ALT 52*  ALKPHOS 90  BILITOT 1.3*   ------------------------------------------------------------------------------------------------------------------ estimated creatinine clearance is 126.8 mL/min (by C-G formula based on SCr of 0.8 mg/dL). ------------------------------------------------------------------------------------------------------------------ No results for input(s): "TSH", "T4TOTAL", "T3FREE", "THYROIDAB" in the last 72 hours.  Invalid input(s): "FREET3"  Coagulation profile No results for input(s): "INR", "PROTIME" in the last 168 hours. ------------------------------------------------------------------------------------------------------------------- No results for input(s): "DDIMER" in the last 72 hours. -------------------------------------------------------------------------------------------------------------------  Cardiac Enzymes No results for input(s): "CKMB", "TROPONINI", "MYOGLOBIN" in the last 168 hours.  Invalid input(s): "CK" ------------------------------------------------------------------------------------------------------------------ No results found for: "BNP"   ---------------------------------------------------------------------------------------------------------------  Urinalysis    Component Value Date/Time   COLORURINE YELLOW 01/09/2023 1414   APPEARANCEUR CLEAR 01/09/2023 1414   LABSPEC 1.021 01/09/2023 1414   PHURINE 6.0 01/09/2023 1414   GLUCOSEU NEGATIVE 01/09/2023 1414   HGBUR NEGATIVE 01/09/2023 1414   BILIRUBINUR NEGATIVE 01/09/2023 1414   KETONESUR 20 (A)  01/09/2023 1414   PROTEINUR 100 (A) 01/09/2023 1414   NITRITE NEGATIVE 01/09/2023 1414   LEUKOCYTESUR NEGATIVE 01/09/2023 1414    ----------------------------------------------------------------------------------------------------------------   Imaging Results:    CT ABDOMEN PELVIS W CONTRAST  Result Date: 01/09/2023 CLINICAL DATA:  Abdominal pain, acute, nonlocalized w/ vomiting EXAM: CT ABDOMEN AND PELVIS WITH CONTRAST TECHNIQUE: Multidetector CT imaging of the abdomen and pelvis was performed using the standard protocol following bolus administration of intravenous contrast. RADIATION DOSE REDUCTION: This exam was performed according to the departmental dose-optimization program which includes automated exposure control, adjustment of the mA and/or kV according to patient size and/or use of iterative reconstruction technique. CONTRAST:  OMNIPAQUE IOHEXOL 300 MG/ML  SOLN COMPARISON:  None Available. FINDINGS: Lower chest: The lung bases are clear. No pleural effusion. The heart is normal in size. No pericardial effusion. Hepatobiliary: The liver is normal in size. Non-cirrhotic configuration. No suspicious mass. These is mild diffuse hepatic steatosis. No intrahepatic or extrahepatic bile duct dilation. No choledocholithiasis. There is ? single, sub 5 mm hyperattenuating gallstone  without imaging signs of acute cholecystitis. Normal gallbladder wall thickness. No pericholecystic inflammatory changes. Pancreas: There is diffuse heterogeneous pancreas with asymmetrically thickened and bulky pancreatic tail and distal body. There is hyperattenuation of the pancreatic head and comparatively less attenuation of the bulky portion of the pancreatic tail, which may be due to extensive interstitial edema however, underlying developing pancreatic necrosis (occupying less than 30% of the total parenchyma) can not be completely excluded on this exam. There is peripancreatic fat stranding and moderate amount  of peripancreatic fluid extending into the bilateral paracolic gutters. Spleen: Within normal limits. No focal lesion. There is very small volume nonocclusive thrombus in the splenic vein just before the confluence with superior mesenteric vein (series 2, image 33). Adrenals/Urinary Tract: Adrenal glands are unremarkable. No suspicious renal mass. No hydronephrosis. No renal or ureteric calculi. Unremarkable urinary bladder. Stomach/Bowel: No disproportionate dilation of the small or large bowel loops. No evidence of abnormal bowel wall thickening or inflammatory changes. The appendix is unremarkable. Vascular/Lymphatic: No pneumoperitoneum. There is moderate amount of free fluid mainly in the peripancreatic region with extension into the bilateral paracolic gutters, right more than left. No abdominal or pelvic lymphadenopathy, by size criteria. No aneurysmal dilation of the major abdominal arteries. Reproductive: Normal size prostate. Symmetric seminal vesicles. Other: The visualized soft tissues and abdominal wall are unremarkable. Musculoskeletal: No suspicious osseous lesions. IMPRESSION: *Extensive interstitial pancreatitis, predominantly involving the distal pancreatic body and tail, with relative less enhancement of the pancreatic tail, as discussed in detail above. There is significant peripancreatic fluid collection. *Small volume nonocclusive thrombus noted in the splenic vein. *Multiple other nonacute observations, as described above. Electronically Signed   By: Jules Schick M.D.   On: 01/09/2023 19:29       Assessment & Plan:    Principal Problem:   Alcoholic pancreatitis Active Problems:   Alcohol withdrawal (HCC)   Tobacco abuse    Acute alcoholic pancreatitis Splenic vein thrombosis -Patient presents with abdominal pain, nausea, vomiting, imaging significant for acute pancreatitis, he has heavy alcohol consumption over the last 9 years. -As needed morphine for pain, as needed Zofran  for nausea, will keep on aggressive IV hydration, will keep n.p.o. 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 -Will consult GI regarding further recommendation for splenic vein thrombosis and acute pancreatitis -Imaging significant for questionable gallstone, but no evidence of choledocholithiasis, bile duct is nondilated no dilation and bile duct  Alcohol abuse with early alcohol withdrawals -Heavy drinking, he is with tremors, tachypnea, tachycardia, restless, will start on CIWA protocol, will add scheduled Librium as well, given his tachypnea and tachycardia will start on clonidine. -Continue thiamine, folic acid and IV fluids  Transaminitis -He is mildly elevated due to alcohol  Tobacco abuse -He was counseled, will start on nicotine patch  Leukocytosis -Most likely due to acute pancreatitis, will continue to monitor, UA is negative, he is afebrile  DVT Prophylaxis   Lovenox  AM Labs Ordered, also please review Full Orders  Family Communication: Admission, patients condition and plan of care including tests being ordered have been discussed with the patient and girlfriend at bedside* who indicate understanding and agree with the plan and Code Status.  Code Status full code  Likely DC to home  Consults called: Gastroenterology consult requested in epic  Admission status: Inpatient  Time spent in minutes : 70 minutes  Huey Bienenstock M.D on 01/09/2023 at 8:36 PM   [facility] - Office  [phone]

## 2023-01-10 DIAGNOSIS — Z720 Tobacco use: Secondary | ICD-10-CM

## 2023-01-10 DIAGNOSIS — F1090 Alcohol use, unspecified, uncomplicated: Secondary | ICD-10-CM

## 2023-01-10 LAB — LIPID PANEL
Cholesterol: 161 mg/dL (ref 0–200)
HDL: 90 mg/dL (ref >40)
LDL Cholesterol: 54 mg/dL (ref 0–99)
Total CHOL/HDL Ratio: 1.8 {ratio}
Triglycerides: 85 mg/dL (ref <150)
VLDL: 17 mg/dL (ref 0–40)

## 2023-01-10 LAB — COMPREHENSIVE METABOLIC PANEL
ALT: 34 U/L (ref 0–44)
AST: 56 U/L — ABNORMAL HIGH (ref 15–41)
Albumin: 3.7 g/dL (ref 3.5–5.0)
Alkaline Phosphatase: 59 U/L (ref 38–126)
Anion gap: 16 — ABNORMAL HIGH (ref 5–15)
BUN: 14 mg/dL (ref 6–20)
CO2: 22 mmol/L (ref 22–32)
Calcium: 9.2 mg/dL (ref 8.9–10.3)
Chloride: 99 mmol/L (ref 98–111)
Creatinine, Ser: 0.71 mg/dL (ref 0.61–1.24)
GFR, Estimated: >60 mL/min (ref >60)
Glucose, Bld: 111 mg/dL — ABNORMAL HIGH (ref 70–99)
Potassium: 3.5 mmol/L (ref 3.5–5.1)
Sodium: 137 mmol/L (ref 135–145)
Total Bilirubin: 1.2 mg/dL — ABNORMAL HIGH (ref <1.2)
Total Protein: 6.5 g/dL (ref 6.5–8.1)

## 2023-01-10 LAB — LIPASE, BLOOD: Lipase: 2256 U/L — ABNORMAL HIGH (ref 11–51)

## 2023-01-10 LAB — CBC
HCT: 52.2 % — ABNORMAL HIGH (ref 39.0–52.0)
Hemoglobin: 18.2 g/dL — ABNORMAL HIGH (ref 13.0–17.0)
MCH: 32.3 pg (ref 26.0–34.0)
MCHC: 34.9 g/dL (ref 30.0–36.0)
MCV: 92.7 fL (ref 80.0–100.0)
Platelets: 143 10*3/uL — ABNORMAL LOW (ref 150–400)
RBC: 5.63 MIL/uL (ref 4.22–5.81)
RDW: 12.4 % (ref 11.5–15.5)
WBC: 15.9 10*3/uL — ABNORMAL HIGH (ref 4.0–10.5)
nRBC: 0 % (ref 0.0–0.2)

## 2023-01-10 LAB — HIV ANTIBODY (ROUTINE TESTING W REFLEX): HIV Screen 4th Generation wRfx: NONREACTIVE

## 2023-01-10 LAB — MAGNESIUM: Magnesium: 1.5 mg/dL — ABNORMAL LOW (ref 1.7–2.4)

## 2023-01-10 LAB — PHOSPHORUS: Phosphorus: 3.6 mg/dL (ref 2.5–4.6)

## 2023-01-10 MED ORDER — CHLORDIAZEPOXIDE HCL 5 MG PO CAPS
25.0000 mg | ORAL_CAPSULE | Freq: Three times a day (TID) | ORAL | Status: DC
  Administered 2023-01-10 – 2023-01-13 (×10): 25 mg via ORAL
  Filled 2023-01-10 (×10): qty 5

## 2023-01-10 MED ORDER — MAGNESIUM SULFATE 4 GM/100ML IV SOLN
4.0000 g | Freq: Once | INTRAVENOUS | Status: AC
  Administered 2023-01-10: 4 g via INTRAVENOUS
  Filled 2023-01-10: qty 100

## 2023-01-10 MED ORDER — HYDROMORPHONE HCL 1 MG/ML IJ SOLN: 0.5000 mg | INTRAMUSCULAR | Status: DC | PRN

## 2023-01-10 MED ORDER — METOPROLOL TARTRATE 5 MG/5ML IV SOLN
5.0000 mg | Freq: Four times a day (QID) | INTRAVENOUS | Status: AC
  Administered 2023-01-10 – 2023-01-14 (×17): 5 mg via INTRAVENOUS
  Filled 2023-01-10 (×17): qty 5

## 2023-01-10 MED ORDER — HYDROMORPHONE HCL 1 MG/ML IJ SOLN
0.5000 mg | INTRAMUSCULAR | Status: DC | PRN
  Administered 2023-01-10 – 2023-01-13 (×13): 0.5 mg via INTRAVENOUS
  Filled 2023-01-10 (×14): qty 0.5

## 2023-01-10 MED ORDER — METOPROLOL TARTRATE 5 MG/5ML IV SOLN
2.5000 mg | Freq: Four times a day (QID) | INTRAVENOUS | Status: DC
  Administered 2023-01-10: 2.5 mg via INTRAVENOUS
  Filled 2023-01-10: qty 5

## 2023-01-10 NOTE — Progress Notes (Signed)
   01/10/23 1010  CIWA-Ar  BP (!) 135/98  Pulse Rate (!) 147  Nausea and Vomiting 0  Tactile Disturbances 0  Tremor 4  Auditory Disturbances 0  Paroxysmal Sweats 4  Visual Disturbances 0  Anxiety 4  Headache, Fullness in Head 0  Agitation 0  Orientation and Clouding of Sensorium 0  CIWA-Ar Total 12   MD Johnson notified. 2mg  of iv ativan given.

## 2023-01-10 NOTE — Progress Notes (Signed)
   01/10/23 0910  TOC Brief Assessment  Insurance and Status Reviewed  Patient has primary care physician No (NO PCP listed)  Prior/Current Home Services No current home services  Readmission risk has been reviewed Yes  Transition of care needs transition of care needs identified, TOC will continue to follow   CSW reviewed record, added SA education and treatment options to AVS. TOC to follow.

## 2023-01-10 NOTE — Consult Note (Signed)
Referring Provider: No ref. provider found Primary Care Physician:  Pcp, No Primary Gastroenterologist:  Dr.Neha Waight  Reason for Consultation: Pancreatitis  HPI: 27 year old gentleman with a history of long-term, ongoing heavy alcohol abuse presented to the ED with a 3-week history of abdominal pain nausea and vomiting.  In the ED, the patient appeared acutely ill.  CT of the abdomen demonstrated significant inflammatory changes about the body and tail of the pancreas with associated stranding and interstitial edema.  No biliary dilation.  Ultrasound demonstrated possible small gallstone in the gallbladder.  Mild nonspecific elevation of bilirubin in aminotransferases.  Triglycerides normal.  No family history of pancreatitis.  Serum IgG4 subclass pending.  No prior episodes of pancreatitis.  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   Patient has been admitted for symptomatic management of pancreatitis.  He has been given IV fluids.  Antiemetics and analgesics.  At [facility] BUN and creatinine remain normal.  BUN and creatinine remain normal.  Patient mildly polycythemic. Past Medical History:  Diagnosis Date   Anxiety    Heart murmur     History reviewed. No pertinent surgical history.  Prior to Admission medications   Medication Sig Start Date End Date Taking? Authorizing Provider  Multiple Vitamin (MULTIVITAMIN) tablet Take 1 tablet by mouth daily.   Yes [provider]    Current Facility-Administered Medications  Medication Dose Route Frequency Provider Last Rate Last Admin   albuterol (PROVENTIL) (2.5 MG/3ML) 0.083% nebulizer solution 2.5 mg  2.5 mg Nebulization Q2H PRN Elgergawy, Leana Roe, MD       chlordiazePOXIDE (LIBRIUM) capsule 25 mg  25 mg Oral TID Johnson, Clanford L, MD   25 mg at 01/10/23 1525   enoxaparin (LOVENOX) injection 40 mg  40 mg Subcutaneous Q24H Elgergawy, Leana Roe, MD   40 mg at 01/09/23 2113    folic acid (FOLVITE) tablet 1 mg  1 mg Oral Daily Elgergawy, Leana Roe, MD   1 mg at 01/10/23 1610   hydrALAZINE (APRESOLINE) injection 5 mg  5 mg Intravenous Q4H PRN Elgergawy, Leana Roe, MD       HYDROmorphone (DILAUDID) injection 0.5 mg  0.5 mg Intravenous Q3H PRN Johnson, Clanford L, MD   0.5 mg at 01/10/23 1529   lactated ringers infusion   Intravenous Continuous Johnson, Clanford L, MD 200 mL/hr at 01/10/23 1545 New Bag at 01/10/23 1545   LORazepam (ATIVAN) tablet 1-4 mg  1-4 mg Oral Q1H PRN Elgergawy, Leana Roe, MD   1 mg at 01/10/23 9604   Or   LORazepam (ATIVAN) injection 1-4 mg  1-4 mg Intravenous Q1H PRN Elgergawy, Leana Roe, MD   2 mg at 01/10/23 1009   metoprolol tartrate (LOPRESSOR) injection 5 mg  5 mg Intravenous Q6H Johnson, Clanford L, MD   5 mg at 01/10/23 1724   multivitamin with minerals tablet 1 tablet  1 tablet Oral Daily Elgergawy, Leana Roe, MD   1 tablet at 01/10/23 5409   nicotine (NICODERM CQ - dosed in mg/24 hours) patch 21 mg  21 mg Transdermal Daily Elgergawy, Leana Roe, MD   21 mg at 01/10/23 0833   ondansetron (ZOFRAN) tablet 4 mg  4 mg Oral Q6H PRN Elgergawy, Leana Roe, MD   4 mg at 01/10/23 0905   Or   ondansetron (ZOFRAN) injection 4 mg  4 mg Intravenous Q6H PRN Elgergawy, Leana Roe, MD   4 mg at 01/09/23 2117  thiamine (VITAMIN B1) tablet 100 mg  100 mg Oral Daily Elgergawy, Leana Roe, MD   100 mg at 01/10/23 1610   Or   thiamine (VITAMIN B1) injection 100 mg  100 mg Intravenous Daily Elgergawy, Leana Roe, MD   100 mg at 01/09/23 2121    Allergies as of 01/09/2023 - Review Complete 01/09/2023  Allergen Reaction Noted   Amoxicillin Other (See Comments) 10/25/2011   Bee pollen Other (See Comments) 01/09/2023   Penicillins Swelling 01/09/2023    History reviewed. No pertinent family history.  Social History   Socioeconomic History   Marital status: Single    Spouse name: Not on file   Number of children: Not on file   Years of education: Not on file    Highest education level: Not on file  Occupational History   Not on file  Tobacco Use   Smoking status: Every Day    Current packs/day: 1.00    Types: Cigarettes   Smokeless tobacco: Not on file  Vaping Use   Vaping status: Never Used  Substance and Sexual Activity   Alcohol use: Not Currently    Comment: few times weekly   Drug use: No   Sexual activity: Not on file  Other Topics Concern   Not on file  Social History Narrative   Not on file   Social Determinants of Health   Financial Resource Strain: Not on file  Food Insecurity: Unknown (01/09/2023)   Hunger Vital Sign    Worried About Running Out of Food in the Last Year: Not on file    Ran Out of Food in the Last Year: Never true  Transportation Needs: No Transportation Needs (01/09/2023)   PRAPARE - Administrator, Civil Service (Medical): No    Lack of Transportation (Non-Medical): No  Physical Activity: Not on file  Stress: Not on file  Social Connections: Not on file  Intimate Partner Violence: Not At Risk (01/09/2023)   Humiliation, Afraid, Rape, and Kick questionnaire    Fear of Current or Ex-Partner: No    Emotionally Abused: No    Physically Abused: No    Sexually Abused: No    Review of Systems:  Unobtainable at this time. Physical Exam: Vital signs in last 24 hours: Temp:  [97.8 F (36.6 C)-99.5 F (37.5 C)] 99 F (37.2 C) (11/23 1138) Pulse Rate:  [85-157] 107 (11/23 1727) Resp:  [14-19] 18 (11/23 0520) BP: (124-160)/(92-111) 142/104 (11/23 1727) SpO2:  [92 %-97 %] 97 % (11/23 1453) Weight:  [68.9 kg] 68.9 kg (11/22 2042)   General:   Sedated/sleeping.  Accompanied by patient's mother Fawaz Fidalgo No jaundice or scleral icterus  Abdomen: Nondistended.  Positive bowel sounds soft and minimally tender  Intake/Output from previous day: 11/22 0701 - 11/23 0700 In: 1000 [IV Piggyback:1000] Out: -  Intake/Output this shift: Total I/O In: 2642.5 [I.V.:2642.5] Out: -   Lab  Results: Recent Labs    01/09/23 1419 01/10/23 0416  WBC 17.9* 15.9*  HGB 18.3* 18.2*  HCT 53.4* 52.2*  PLT 159 143*   BMET Recent Labs    01/09/23 1419 01/10/23 0416  NA 137 137  K 3.6 3.5  CL 96* 99  CO2 26 22  GLUCOSE 108* 111*  BUN 11 14  CREATININE 0.80 0.71  CALCIUM 9.6 9.2   LFT Recent Labs    01/10/23 0416  PROT 6.5  ALBUMIN 3.7  AST 56*  ALT 34  ALKPHOS 59  BILITOT 1.2*  Impression:  27 year old gentleman with alcohol use disorder admitted to the hospital with acute interstitial pancreatitis along with a small nonocclusive splenic vein thrombus. At this time, pancreatitis appears to be uncomplicated. BUN/creatinine remain normal.  Mildly polycythemic. Relatively polycythemic on his prior CBC in the past.  It appears he has been adequately hydrated.  Suspect alcohol is the culprit here.  Mild bump in LFTs-nonspecific  Small gallstone in the gallbladder likely an incidental finding.  IgG4 subclass level is pending  Recommendations:  Supportive at this time:  *Continue n.p.o./analgesics as needed.  *Help with alcohol abstinence after he is over acute illness.  I discussed the gravity of the situation and the importance of alcohol abstinence with his mother, Kruz Garr at length at the bedside.  Further recommendations to follow.   Notice:  This dictation was prepared with Dragon dictation along with smaller phrase technology. Any transcriptional errors that result from this process are unintentional and may not be corrected upon review.

## 2023-01-10 NOTE — Progress Notes (Signed)
   01/10/23 1048  Vitals  BP (!) 130/103  MAP (mmHg) 112  Pulse Rate (!) 157   Metoprolol given

## 2023-01-10 NOTE — Progress Notes (Signed)
PROGRESS NOTE   Bryan Edwards  ZOX:[id number] DOB: [birth year] DOA: 01/09/2023 PCP: Pcp, No   Chief Complaint  Patient presents with   Abdominal Pain   Level of care: Telemetry  Brief Admission History:  27 y.o. male, past medical history of heavy alcohol abuse, tobacco abuse, patient presents to ED secondary to complaints of nausea, vomiting and abdominal pain, for last 3 weeks, patient reports patient started gradually, he was able to keep some food, and he kept drinking, but had much progressed recently, reports started to vomit yesterday, but he had multiple episodes of vomiting today, he does report abdominal pain as well, patient reports history of heavy alcohol abuse, he has been drinking for last 9 years, multiple beers and shots every day, reports this morning he was unable to keep the shot where he did vomited, he came to ED for further evaluation, he denies fever, chills, diarrhea, constipation, sick contacts. -In ED patient was noted to be tachycardic, hypertensive, labs were significant for elevated lipase at 2735, AST elevated at 67, ALT elevated at 52, and his total bili at 1.3, alk phos within normal limit at 90, white blood cell count elevated at 17.9 K, but his urine is negative, the abdomen pelvis significant for acute pancreatitis, splenic vein thrombosis, no evidence of cholelithiasis, bile duct within normal limit, but questionable gallstone, [facility] hospitalist consulted to admit.   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 wanted to defer to GI recommendation - further recs to follow  Severe Alcohol Abuse  - started on CIWA protocol  - scheduled librium 25 mg TID ordered   - thiamine, folic acid, MVI ordered - urine toxicology negative   Sinus tachycardia  - secondary to above - follow   Elevated blood pressures - added lopressor IV every 6 hours with holding parameters  Transaminitis  - secondary to severe alcohol abuse  Tobacco abuse - nicotine patch ordered   Leukocytosis  - suspect reactive - treating supportively for now  DVT prophylaxis: enoxaparin Code Status: Full  Family Communication: bedside update 11/23 Disposition:   Consultants:  GI  Procedures:   Antimicrobials:    Subjective: Pt complains of severe abdominal pain wants to switch to dilaudid  Objective: Vitals:   01/10/23 1010 01/10/23 1048 01/10/23 1138 01/10/23 1453  BP: (!) 135/98 (!) 130/103 (!) 124/92 (!) 135/98  Pulse: (!) 147 (!) 157 (!) 130 (!) 135  Resp:      Temp:   99 F (37.2 C)   TempSrc:   Oral   SpO2:   94% 97%  Weight:      Height:        Intake/Output Summary (Last 24 hours) at 01/10/2023 1503 Last data filed at 01/09/2023 1824 Gross per 24 hour  Intake 1000 ml  Output --  Net 1000 ml   Filed Weights   01/09/23 1401 01/09/23 2042  Weight: 65.8 kg 68.9 kg   Examination:  General exam: Appears calm and comfortable  Respiratory system: Clear to auscultation. Respiratory effort normal. Cardiovascular system: normal S1 & S2 heard. No JVD, murmurs, rubs, gallops or clicks. No pedal edema. Gastrointestinal system: Abdomen is nondistended, soft and nontender. No organomegaly or masses felt. Normal bowel sounds  heard. Central nervous system: Alert and oriented. No focal neurological deficits. Extremities: Symmetric 5 x 5 power. Skin: No rashes, lesions or ulcers. Psychiatry: Judgement and insight appear normal. Mood & affect appropriate.   Data Reviewed: I have personally reviewed following labs and imaging studies  CBC: Recent Labs  Lab 01/09/23 1419 01/10/23 0416  WBC 17.9* 15.9*  HGB 18.3* 18.2*  HCT 53.4* 52.2*  MCV 94.7 92.7   PLT 159 143*    Basic Metabolic Panel: Recent Labs  Lab 01/09/23 1419 01/10/23 0416  NA 137 137  K 3.6 3.5  CL 96* 99  CO2 26 22  GLUCOSE 108* 111*  BUN 11 14  CREATININE 0.80 0.71  CALCIUM 9.6 9.2  MG  --  1.5*  PHOS  --  3.6    CBG: No results for input(s): "GLUCAP" in the last 168 hours.  No results found for this or any previous visit (from the past 240 hour(s)).   Radiology Studies: CT ABDOMEN PELVIS W CONTRAST  Result Date: 01/09/2023 CLINICAL DATA:  Abdominal pain, acute, nonlocalized w/ vomiting EXAM: CT ABDOMEN AND PELVIS WITH CONTRAST TECHNIQUE: Multidetector CT imaging of the abdomen and pelvis was performed using the standard protocol following bolus administration of intravenous contrast. RADIATION DOSE REDUCTION: This exam was performed according to the departmental dose-optimization program which includes automated exposure control, adjustment of the mA and/or kV according to patient size and/or use of iterative reconstruction technique. CONTRAST:  OMNIPAQUE IOHEXOL 300 MG/ML  SOLN COMPARISON:  None Available. FINDINGS: Lower chest: The lung bases are clear. No pleural effusion. The heart is normal in size. No pericardial effusion. Hepatobiliary: The liver is normal in size. Non-cirrhotic configuration. No suspicious mass. These is mild diffuse hepatic steatosis. No intrahepatic or extrahepatic bile duct dilation. No choledocholithiasis. There is ? single, sub 5 mm hyperattenuating gallstone without imaging signs of acute cholecystitis. Normal gallbladder wall thickness. No pericholecystic inflammatory changes. Pancreas: There is diffuse heterogeneous pancreas with asymmetrically thickened and bulky pancreatic tail and distal body. There is hyperattenuation of the pancreatic head and comparatively less attenuation of the bulky portion of the pancreatic tail, which may be due to extensive interstitial edema however, underlying developing pancreatic necrosis  (occupying less than 30% of the total parenchyma) can not be completely excluded on this exam. There is peripancreatic fat stranding and moderate amount of peripancreatic fluid extending into the bilateral paracolic gutters. Spleen: Within normal limits. No focal lesion. There is very small volume nonocclusive thrombus in the splenic vein just before the confluence with superior mesenteric vein (series 2, image 33). Adrenals/Urinary Tract: Adrenal glands are unremarkable. No suspicious renal mass. No hydronephrosis. No renal or ureteric calculi. Unremarkable urinary bladder. Stomach/Bowel: No disproportionate dilation of the small or large bowel loops. No evidence of abnormal bowel wall thickening or inflammatory changes. The appendix is unremarkable. Vascular/Lymphatic: No pneumoperitoneum. There is moderate amount of free fluid mainly in the peripancreatic region with extension into the bilateral paracolic gutters, right more than left. No abdominal or pelvic lymphadenopathy, by size criteria. No aneurysmal dilation of the major abdominal arteries. Reproductive: Normal size prostate. Symmetric seminal vesicles. Other: The visualized soft tissues and abdominal wall are unremarkable. Musculoskeletal: No suspicious osseous lesions. IMPRESSION: *Extensive interstitial pancreatitis, predominantly involving the distal pancreatic body and tail, with relative less enhancement of the pancreatic tail, as discussed in detail above. There is significant peripancreatic fluid collection. *Small volume nonocclusive thrombus noted in the splenic vein. *Multiple other nonacute observations, as described above. Electronically  Signed   By: Jules Schick M.D.   On: 01/09/2023 19:29    Scheduled Meds:  chlordiazePOXIDE  25 mg Oral TID   enoxaparin (LOVENOX) injection  40 mg Subcutaneous Q24H   folic acid  1 mg Oral Daily   metoprolol tartrate  5 mg Intravenous Q6H   multivitamin with minerals  1 tablet Oral Daily   nicotine   21 mg Transdermal Daily   thiamine  100 mg Oral Daily   Or   thiamine  100 mg Intravenous Daily   Continuous Infusions:  lactated ringers 200 mL/hr at 01/10/23 0834     LOS: 1 day   Time spent: 58 mins  Gurbani Figge Laural Benes, MD How to contact the [facility] Attending or Consulting provider 7A - 7P or covering provider during after hours 7P -7A, for this patient?  Check the care team in [facility] and look for a) attending/consulting [facility] provider listed and b) the [facility] team listed Log into [email or web address] to find provider on call.  Locate the [facility] provider you are looking for under [facility] and page to a number that you can be directly reached. If you still have difficulty reaching the provider, please page the [facility] (Director on Call) for the Hospitalists listed on amion for assistance.  01/10/2023, 3:03 PM

## 2023-01-11 ENCOUNTER — Encounter (HOSPITAL_COMMUNITY): Payer: Self-pay | Admitting: Internal Medicine

## 2023-01-11 LAB — COMPREHENSIVE METABOLIC PANEL
ALT: 22 U/L (ref 0–44)
AST: 36 U/L (ref 15–41)
Albumin: 2.9 g/dL — ABNORMAL LOW (ref 3.5–5.0)
Alkaline Phosphatase: 51 U/L (ref 38–126)
Anion gap: 7 (ref 5–15)
BUN: 12 mg/dL (ref 6–20)
CO2: 28 mmol/L (ref 22–32)
Calcium: 8.3 mg/dL — ABNORMAL LOW (ref 8.9–10.3)
Chloride: 98 mmol/L (ref 98–111)
Creatinine, Ser: 0.69 mg/dL (ref 0.61–1.24)
GFR, Estimated: >60 mL/min (ref >60)
Glucose, Bld: 93 mg/dL (ref 70–99)
Potassium: 3.9 mmol/L (ref 3.5–5.1)
Sodium: 133 mmol/L — ABNORMAL LOW (ref 135–145)
Total Bilirubin: 1 mg/dL (ref <1.2)
Total Protein: 5.4 g/dL — ABNORMAL LOW (ref 6.5–8.1)

## 2023-01-11 LAB — CBC WITH DIFFERENTIAL/PLATELET
Abs Immature Granulocytes: 0.09 10*3/uL — ABNORMAL HIGH (ref 0.00–0.07)
Basophils Absolute: 0 10*3/uL (ref 0.0–0.1)
Basophils Relative: 0 %
Eosinophils Absolute: 0 10*3/uL (ref 0.0–0.5)
Eosinophils Relative: 0 %
HCT: 43.2 % (ref 39.0–52.0)
Hemoglobin: 14.5 g/dL (ref 13.0–17.0)
Immature Granulocytes: 1 %
Lymphocytes Relative: 8 %
Lymphs Abs: 1 10*3/uL (ref 0.7–4.0)
MCH: 31.6 pg (ref 26.0–34.0)
MCHC: 33.6 g/dL (ref 30.0–36.0)
MCV: 94.1 fL (ref 80.0–100.0)
Monocytes Absolute: 1.5 10*3/uL — ABNORMAL HIGH (ref 0.1–1.0)
Monocytes Relative: 13 %
Neutro Abs: 9.4 10*3/uL — ABNORMAL HIGH (ref 1.7–7.7)
Neutrophils Relative %: 78 %
Platelets: 112 10*3/uL — ABNORMAL LOW (ref 150–400)
RBC: 4.59 MIL/uL (ref 4.22–5.81)
RDW: 12.5 % (ref 11.5–15.5)
WBC: 11.9 10*3/uL — ABNORMAL HIGH (ref 4.0–10.5)
nRBC: 0 % (ref 0.0–0.2)

## 2023-01-11 LAB — GLUCOSE, CAPILLARY: Glucose-Capillary: 110 mg/dL — ABNORMAL HIGH (ref 70–99)

## 2023-01-11 LAB — MAGNESIUM: Magnesium: 2.2 mg/dL (ref 1.7–2.4)

## 2023-01-11 LAB — LIPASE, BLOOD: Lipase: 683 U/L — ABNORMAL HIGH (ref 11–51)

## 2023-01-11 LAB — IGG 4: IgG, Subclass 4: 2 mg/dL (ref 2–96)

## 2023-01-11 MED ORDER — LACTATED RINGERS IV SOLN: INTRAVENOUS | Status: DC

## 2023-01-11 MED ORDER — ACETAMINOPHEN 325 MG PO TABS
650.0000 mg | ORAL_TABLET | Freq: Four times a day (QID) | ORAL | Status: DC | PRN
  Administered 2023-01-11: 650 mg via ORAL
  Filled 2023-01-11: qty 2

## 2023-01-11 MED ORDER — ALUM & MAG HYDROXIDE-SIMETH 200-200-20 MG/5ML PO SUSP
30.0000 mL | ORAL | Status: DC | PRN
  Administered 2023-01-11: 30 mL via ORAL
  Filled 2023-01-11: qty 30

## 2023-01-11 NOTE — Plan of Care (Signed)
  Problem: Education: Goal: Knowledge of General Education information will improve Description Including pain rating scale, medication(s)/side effects and non-pharmacologic comfort measures Outcome: Progressing   Problem: Health Behavior/Discharge Planning: Goal: Ability to manage health-related needs will improve Outcome: Progressing   

## 2023-01-11 NOTE — Progress Notes (Signed)
PROGRESS NOTE   Bryan Edwards  HYQ:[id number] DOB: 01-31-1996 DOA: 01/09/2023 PCP: Pcp, No   Chief Complaint  Patient presents with   Abdominal Pain   Level of care: Telemetry  Brief Admission History:  27 y.o. male, past medical history of heavy alcohol abuse, tobacco abuse, patient presents to ED secondary to complaints of nausea, vomiting and abdominal pain, for last 3 weeks, patient reports patient started gradually, he was able to keep some food, and he kept drinking, but had much progressed recently, reports started to vomit yesterday, but he had multiple episodes of vomiting today, he does report abdominal pain as well, patient reports history of heavy alcohol abuse, he has been drinking for last 9 years, multiple beers and shots every day, reports this morning he was unable to keep the shot where he did vomited, he came to ED for further evaluation, he denies fever, chills, diarrhea, constipation, sick contacts. -In ED patient was noted to be tachycardic, hypertensive, labs were significant for elevated lipase at 2735, AST elevated at 67, ALT elevated at 52, and his total bili at 1.3, alk phos within normal limit at 90, white blood cell count elevated at 17.9 K, but his urine is negative, the abdomen pelvis significant for acute pancreatitis, splenic vein thrombosis, no evidence of cholelithiasis, bile duct within normal limit, but questionable gallstone, [facility] hospitalist consulted to admit.   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 wanted to defer to GI recommendation - further recs to follow  Severe Alcohol Abuse  - started on CIWA protocol  - scheduled librium 25 mg TID  ordered, titrate down over next couple of days - thiamine, folic acid, MVI ordered - urine toxicology negative   Sinus tachycardia  - secondary to above - improving with supportive care   Elevated blood pressures - added lopressor IV every 6 hours with holding parameters  Transaminitis  - secondary to severe alcohol abuse  Tobacco abuse - nicotine patch ordered   Leukocytosis  - suspect reactive - treating supportively for now - WBC trending down   DVT prophylaxis: enoxaparin Code Status: Full  Family Communication: bedside update 11/23, 11/24 Disposition: anticipate home when medically stabilized   Consultants:  GI  Procedures:   Antimicrobials:    Subjective: Still having significant abdominal pain, no emesis, no alcohol withdrawal symptoms.   Objective: Vitals:   01/10/23 1811 01/10/23 2154 01/11/23 0518 01/11/23 0550  BP: (!) 140/98 (!) 131/99  126/86  Pulse: 99 (!) 110 (!) 103 (!) 110  Resp:  19    Temp: 98.8 F (37.1 C) 98.9 F (37.2 C)  99.4 F (37.4 C)  TempSrc:    Oral  SpO2: 97% 97%  98%  Weight:      Height:        Intake/Output Summary (Last 24 hours) at 01/11/2023 0959 Last data filed at 01/10/2023 1517 Gross per 24 hour  Intake 2642.53 ml  Output --  Net 2642.53 ml   Filed Weights   01/09/23 1401 01/09/23 2042  Weight: 65.8 kg 68.9 kg   Examination:  General exam: Appears calm and comfortable  Respiratory system: Clear to auscultation. Respiratory effort normal. Cardiovascular system: normal S1 & S2 heard.  No JVD, murmurs, rubs, gallops or clicks. No pedal edema. Gastrointestinal system: Abdomen is tense, with guarding. Epigastric tenderness.  No organomegaly or masses felt. Normal bowel sounds heard. Central nervous system: Alert and oriented. No focal neurological deficits. Extremities: Symmetric 5 x 5 power. Skin: No rashes, lesions or ulcers. Psychiatry: Judgement and insight appear normal. Mood & affect appropriate.   Data  Reviewed: I have personally reviewed following labs and imaging studies  CBC: Recent Labs  Lab 01/09/23 1419 01/10/23 0416 01/11/23 0407  WBC 17.9* 15.9* 11.9*  NEUTROABS  --   --  9.4*  HGB 18.3* 18.2* 14.5  HCT 53.4* 52.2* 43.2  MCV 94.7 92.7 94.1  PLT 159 143* 112*    Basic Metabolic Panel: Recent Labs  Lab 01/09/23 1419 01/10/23 0416 01/11/23 0407 01/11/23 0756  NA 137 137 133*  --   K 3.6 3.5 3.9  --   CL 96* 99 98  --   CO2 26 22 28   --   GLUCOSE 108* 111* 93  --   BUN 11 14 12   --   CREATININE 0.80 0.71 0.69  --   CALCIUM 9.6 9.2 8.3*  --   MG  --  1.5*  --  2.2  PHOS  --  3.6  --   --     CBG: Recent Labs  Lab 01/11/23 0718  GLUCAP 110*    No results found for this or any previous visit (from the past 240 hour(s)).   Radiology Studies: CT ABDOMEN PELVIS W CONTRAST  Result Date: 01/09/2023 CLINICAL DATA:  Abdominal pain, acute, nonlocalized w/ vomiting EXAM: CT ABDOMEN AND PELVIS WITH CONTRAST TECHNIQUE: Multidetector CT imaging of the abdomen and pelvis was performed using the standard protocol following bolus administration of intravenous contrast. RADIATION DOSE REDUCTION: This exam was performed according to the departmental dose-optimization program which includes automated exposure control, adjustment of the mA and/or kV according to patient size and/or use of iterative reconstruction technique. CONTRAST:  OMNIPAQUE IOHEXOL 300 MG/ML  SOLN COMPARISON:  None Available. FINDINGS: Lower chest: The lung bases are clear. No pleural effusion. The heart is normal in size. No pericardial effusion. Hepatobiliary: The liver is normal in size. Non-cirrhotic configuration. No suspicious mass. These is mild diffuse hepatic steatosis. No intrahepatic or extrahepatic bile duct dilation. No choledocholithiasis. There is ? single, sub 5 mm hyperattenuating gallstone without imaging signs of acute cholecystitis. Normal gallbladder wall thickness. No pericholecystic  inflammatory changes. Pancreas: There is diffuse heterogeneous pancreas with asymmetrically thickened and bulky pancreatic tail and distal body. There is hyperattenuation of the pancreatic head and comparatively less attenuation of the bulky portion of the pancreatic tail, which may be due to extensive interstitial edema however, underlying developing pancreatic necrosis (occupying less than 30% of the total parenchyma) can not be completely excluded on this exam. There is peripancreatic fat stranding and moderate amount of peripancreatic fluid extending into the bilateral paracolic gutters. Spleen: Within normal limits. No focal lesion. There is very small volume nonocclusive thrombus in the splenic vein just before the confluence with superior mesenteric vein (series 2, image 33). Adrenals/Urinary Tract: Adrenal glands are unremarkable. No suspicious renal mass. No hydronephrosis. No renal or ureteric calculi. Unremarkable urinary bladder. Stomach/Bowel: No disproportionate dilation of the small or large bowel loops. No evidence of abnormal bowel wall thickening or inflammatory changes. The appendix is unremarkable. Vascular/Lymphatic: No pneumoperitoneum. There is moderate amount of free fluid mainly in the peripancreatic region with extension into the bilateral paracolic  gutters, right more than left. No abdominal or pelvic lymphadenopathy, by size criteria. No aneurysmal dilation of the major abdominal arteries. Reproductive: Normal size prostate. Symmetric seminal vesicles. Other: The visualized soft tissues and abdominal wall are unremarkable. Musculoskeletal: No suspicious osseous lesions. IMPRESSION: *Extensive interstitial pancreatitis, predominantly involving the distal pancreatic body and tail, with relative less enhancement of the pancreatic tail, as discussed in detail above. There is significant peripancreatic fluid collection. *Small volume nonocclusive thrombus noted in the splenic vein. *Multiple  other nonacute observations, as described above. Electronically Signed   By: Jules Schick M.D.   On: 01/09/2023 19:29    Scheduled Meds:  chlordiazePOXIDE  25 mg Oral TID   enoxaparin (LOVENOX) injection  40 mg Subcutaneous Q24H   folic acid  1 mg Oral Daily   metoprolol tartrate  5 mg Intravenous Q6H   multivitamin with minerals  1 tablet Oral Daily   nicotine  21 mg Transdermal Daily   thiamine  100 mg Oral Daily   Or   thiamine  100 mg Intravenous Daily   Continuous Infusions:  lactated ringers      LOS: 2 days   Time spent: 55 mins  Paisely Brick Laural Benes, MD How to contact the [facility] Attending or Consulting provider 7A - 7P or covering provider during after hours 7P -7A, for this patient?  Check the care team in [facility] and look for a) attending/consulting [facility] provider listed and b) the [facility] team listed Log into [email or web address] to find provider on call.  Locate the [facility] provider you are looking for under [facility] and page to a number that you can be directly reached. If you still have difficulty reaching the provider, please page the [facility] (Director on Call) for the Hospitalists listed on amion for assistance.  01/11/2023, 9:59 AM

## 2023-01-12 LAB — CBC WITH DIFFERENTIAL/PLATELET
Abs Immature Granulocytes: 0.07 10*3/uL (ref 0.00–0.07)
Basophils Absolute: 0 10*3/uL (ref 0.0–0.1)
Basophils Relative: 0 %
Eosinophils Absolute: 0.1 10*3/uL (ref 0.0–0.5)
Eosinophils Relative: 1 %
HCT: 38.8 % — ABNORMAL LOW (ref 39.0–52.0)
Hemoglobin: 12.9 g/dL — ABNORMAL LOW (ref 13.0–17.0)
Immature Granulocytes: 1 %
Lymphocytes Relative: 10 %
Lymphs Abs: 0.8 10*3/uL (ref 0.7–4.0)
MCH: 32.1 pg (ref 26.0–34.0)
MCHC: 33.2 g/dL (ref 30.0–36.0)
MCV: 96.5 fL (ref 80.0–100.0)
Monocytes Absolute: 1.2 10*3/uL — ABNORMAL HIGH (ref 0.1–1.0)
Monocytes Relative: 13 %
Neutro Abs: 6.7 10*3/uL (ref 1.7–7.7)
Neutrophils Relative %: 75 %
Platelets: 125 10*3/uL — ABNORMAL LOW (ref 150–400)
RBC: 4.02 MIL/uL — ABNORMAL LOW (ref 4.22–5.81)
RDW: 12.3 % (ref 11.5–15.5)
WBC: 8.8 10*3/uL (ref 4.0–10.5)
nRBC: 0 % (ref 0.0–0.2)

## 2023-01-12 LAB — COMPREHENSIVE METABOLIC PANEL
ALT: 19 U/L (ref 0–44)
AST: 30 U/L (ref 15–41)
Albumin: 2.6 g/dL — ABNORMAL LOW (ref 3.5–5.0)
Alkaline Phosphatase: 52 U/L (ref 38–126)
Anion gap: 9 (ref 5–15)
BUN: 8 mg/dL (ref 6–20)
CO2: 28 mmol/L (ref 22–32)
Calcium: 8.4 mg/dL — ABNORMAL LOW (ref 8.9–10.3)
Chloride: 97 mmol/L — ABNORMAL LOW (ref 98–111)
Creatinine, Ser: 0.59 mg/dL — ABNORMAL LOW (ref 0.61–1.24)
GFR, Estimated: >60 mL/min (ref >60)
Glucose, Bld: 80 mg/dL (ref 70–99)
Potassium: 3.5 mmol/L (ref 3.5–5.1)
Sodium: 134 mmol/L — ABNORMAL LOW (ref 135–145)
Total Bilirubin: 1.3 mg/dL — ABNORMAL HIGH (ref <1.2)
Total Protein: 5.7 g/dL — ABNORMAL LOW (ref 6.5–8.1)

## 2023-01-12 LAB — LIPASE, BLOOD: Lipase: 176 U/L — ABNORMAL HIGH (ref 11–51)

## 2023-01-12 MED ORDER — LACTATED RINGERS IV SOLN: INTRAVENOUS | Status: DC

## 2023-01-12 NOTE — Progress Notes (Signed)
Patient independently took self to shower. Reminded to wait for assistance.

## 2023-01-12 NOTE — TOC Progression Note (Signed)
Transition of Care [facility]) - Progression Note    Patient Details  Name: Bryan Edwards MRN: [id number] Date of Birth: August 26, 1995  [facility] CM/SW Contact  Karn Cassis, [place] Phone Number: 01/12/2023, 10:13 AM  Clinical Narrative:  TOC noted pt does not have PCP or insurance. Pt reports he is working on [employer] application. Discussed [facility] referral and pt agreeable. Referral made to [facility].        Barriers to Discharge: Continued Medical Work up  Expected Discharge Plan and Services                                               Social Determinants of Health (SDOH) Interventions SDOH Screenings   Food Insecurity: Unknown (01/09/2023)  Housing: Low Risk  (01/09/2023)  Transportation Needs: No Transportation Needs (01/09/2023)  Utilities: Not At Risk (01/09/2023)  Tobacco Use: High Risk (01/11/2023)    Readmission Risk Interventions     No data to display

## 2023-01-12 NOTE — Progress Notes (Signed)
Patient ID: Bryan Edwards; [id number]; 07-01-1995    Subjective   Nausea much improved. No vomiting. Feels he is slowly getting better regarding abdominal pain. Still persists and requiring IV Dilaudid but feels severity slowly improving. Has the shakes but not severe. No mental status changes or confusion. Family members at bedside.    Objective   Vital signs in last 24 hours Temp:  [98.5 F (36.9 C)-101.1 F (38.4 C)] 98.5 F (36.9 C) (11/25 0421) Pulse Rate:  [90-126] 96 (11/25 0509) Resp:  [16-19] 16 (11/24 2352) BP: (123-137)/(77-96) 130/86 (11/25 0509) SpO2:  [94 %-98 %] 94 % (11/25 0421)    Physical Exam General:   Alert and oriented, pleasant Head:  Normocephalic and atraumatic. Abdomen:  Bowel sounds present, soft, moderately TTP epigastric Extremities:  Without edema. Neurologic:  Alert and  oriented x4   Intake/Output from previous day: 11/24 0701 - 11/25 0700 In: 579.2 [I.V.:579.2] Out: -  Intake/Output this shift: No intake/output data recorded.  Lab Results  Recent Labs    01/10/23 0416 01/11/23 0407 01/12/23 0436  WBC 15.9* 11.9* 8.8  HGB 18.2* 14.5 12.9*  HCT 52.2* 43.2 38.8*  PLT 143* 112* 125*   BMET Recent Labs    01/10/23 0416 01/11/23 0407 01/12/23 0436  NA 137 133* 134*  K 3.5 3.9 3.5  CL 99 98 97*  CO2 22 28 28   GLUCOSE 111* 93 80  BUN 14 12 8   CREATININE 0.71 0.69 0.59*  CALCIUM 9.2 8.3* 8.4*   LFT Recent Labs    01/10/23 0416 01/11/23 0407 01/12/23 0436  PROT 6.5 5.4* 5.7*  ALBUMIN 3.7 2.9* 2.6*  AST 56* 36 30  ALT 34 22 19  ALKPHOS 59 51 52  BILITOT 1.2* 1.0 1.3*    Studies/Results CT ABDOMEN PELVIS W CONTRAST  Result Date: 01/09/2023 CLINICAL DATA:  Abdominal pain, acute, nonlocalized w/ vomiting EXAM: CT ABDOMEN AND PELVIS WITH CONTRAST TECHNIQUE: Multidetector CT imaging of the abdomen and pelvis was performed using the standard protocol following bolus administration of intravenous contrast. RADIATION  DOSE REDUCTION: This exam was performed according to the departmental dose-optimization program which includes automated exposure control, adjustment of the mA and/or kV according to patient size and/or use of iterative reconstruction technique. CONTRAST:  OMNIPAQUE IOHEXOL 300 MG/ML  SOLN COMPARISON:  None Available. FINDINGS: Lower chest: The lung bases are clear. No pleural effusion. The heart is normal in size. No pericardial effusion. Hepatobiliary: The liver is normal in size. Non-cirrhotic configuration. No suspicious mass. These is mild diffuse hepatic steatosis. No intrahepatic or extrahepatic bile duct dilation. No choledocholithiasis. There is ? single, sub 5 mm hyperattenuating gallstone without imaging signs of acute cholecystitis. Normal gallbladder wall thickness. No pericholecystic inflammatory changes. Pancreas: There is diffuse heterogeneous pancreas with asymmetrically thickened and bulky pancreatic tail and distal body. There is hyperattenuation of the pancreatic head and comparatively less attenuation of the bulky portion of the pancreatic tail, which may be due to extensive interstitial edema however, underlying developing pancreatic necrosis (occupying less than 30% of the total parenchyma) can not be completely excluded on this exam. There is peripancreatic fat stranding and moderate amount of peripancreatic fluid extending into the bilateral paracolic gutters. Spleen: Within normal limits. No focal lesion. There is very small volume nonocclusive thrombus in the splenic vein just before the confluence with superior mesenteric vein (series 2, image 33). Adrenals/Urinary Tract: Adrenal glands are unremarkable. No suspicious renal mass. No hydronephrosis. No renal or ureteric calculi.  Unremarkable urinary bladder. Stomach/Bowel: No disproportionate dilation of the small or large bowel loops. No evidence of abnormal bowel wall thickening or inflammatory changes. The appendix is  unremarkable. Vascular/Lymphatic: No pneumoperitoneum. There is moderate amount of free fluid mainly in the peripancreatic region with extension into the bilateral paracolic gutters, right more than left. No abdominal or pelvic lymphadenopathy, by size criteria. No aneurysmal dilation of the major abdominal arteries. Reproductive: Normal size prostate. Symmetric seminal vesicles. Other: The visualized soft tissues and abdominal wall are unremarkable. Musculoskeletal: No suspicious osseous lesions. IMPRESSION: *Extensive interstitial pancreatitis, predominantly involving the distal pancreatic body and tail, with relative less enhancement of the pancreatic tail, as discussed in detail above. There is significant peripancreatic fluid collection. *Small volume nonocclusive thrombus noted in the splenic vein. *Multiple other nonacute observations, as described above. Electronically Signed   By: Jules Schick M.D.   On: 01/09/2023 19:29    Assessment  27 y.o. male with a history of chronic, heavy alcohol use, presenting with acute pancreatitis complicated by small non-occlusive splenic vein thrombus.   Etiology appears to be alcohol related. Suspect gallstone is incidental finding on CT. IgG 4 is normal. Leukocytosis on admission max 17.9, trended down with supportive care and now normal today. Lipase improving drastically since admission. Renal function well-preserved.   Clinically, he has had improvement since admission albeit slow. Febrile overnight with Temp of 101.1 but now afebrile. Blood cultures pending. Will just have ice chips for now and can potentially advance to clears later today.   Remains on CIWA protocol.    Plan / Recommendations  Continue supportive measures. Agree with continued hydration Continue thiamine, CIWA protocol, pain support No need for reimaging unless worsening abdominal pain Recommend absolute alcohol cessation going forward Outpatient follow-up for hepatic steatosis and  can have dedicated pancreatic imaging in about 6-8 weeks Hopeful advancement to clears later today    LOS: 3 days    01/12/2023, 8:46 AM  Gelene Mink, PhD, ANP-BC [facility]

## 2023-01-12 NOTE — Progress Notes (Signed)
Physician notified of MEWS "yellow" related to increased heart rate.

## 2023-01-12 NOTE — Progress Notes (Signed)
Overnight patient with fever of 101.89F, MD notified. Orders received for increased IVF, blood cultures, and Tylenol. Tylenol given, pt afebrile since. CIWA scoring 14 at highest overnight, Ativan effective as patient continues to experience withdrawal symptoms. Patient still continues with abdominal pain, Dilaudid effective in minimizing pain.

## 2023-01-12 NOTE — Progress Notes (Signed)
PROGRESS NOTE   Bryan Edwards  WGN:[id number] DOB: 11/30/1995 DOA: 01/09/2023 PCP: Pcp, No   Chief Complaint  Patient presents with   Abdominal Pain   Level of care: Telemetry  Brief Admission History:  27 y.o. male, past medical history of heavy alcohol abuse, tobacco abuse, patient presents to ED secondary to complaints of nausea, vomiting and abdominal pain, for last 3 weeks, patient reports patient started gradually, he was able to keep some food, and he kept drinking, but had much progressed recently, reports started to vomit yesterday, but he had multiple episodes of vomiting today, he does report abdominal pain as well, patient reports history of heavy alcohol abuse, he has been drinking for last 9 years, multiple beers and shots every day, reports this morning he was unable to keep the shot where he did vomited, he came to ED for further evaluation, he denies fever, chills, diarrhea, constipation, sick contacts. -In ED patient was noted to be tachycardic, hypertensive, labs were significant for elevated lipase at 2735, AST elevated at 67, ALT elevated at 52, and his total bili at 1.3, alk phos within normal limit at 90, white blood cell count elevated at 17.9 K, but his urine is negative, the abdomen pelvis significant for acute pancreatitis, splenic vein thrombosis, no evidence of cholelithiasis, bile duct within normal limit, but questionable gallstone, [facility] hospitalist consulted to admit.   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 started on admission wanted to defer to GI recommendation - further recs to follow  Severe Alcohol Abuse  - started on CIWA protocol  - scheduled  librium 25 mg TID ordered, titrate down over next couple of days if continues to improve - thiamine, folic acid, MVI ordered - urine toxicology negative for recreational substances   Sinus tachycardia  - secondary to above - improving with supportive care   Elevated blood pressures/HTN - added lopressor IV every 6 hours with holding parameters  Transaminitis  - secondary to severe alcohol abuse - LFTs trending down with supportive care   Tobacco abuse - nicotine patch ordered   Leukocytosis - resolved  - suspect reactive - treated supportively - WBC normalized    DVT prophylaxis: enoxaparin Code Status: Full  Family Communication: bedside update 11/23, 11/24, 11/25  Disposition: anticipate home when medically stabilized   Consultants:  GI  Procedures:   Antimicrobials:    Subjective: Pt reports last night he was able to sleep better with less pain, says his abdomen is less tight; shakes seem to be minimal at this time, no hallucinations.   Objective: Vitals:   01/12/23 0421 01/12/23 0509 01/12/23 0938 01/12/23 1125  BP: 131/88 130/86 135/88 (!) 140/100  Pulse: (!) 105 96 (!) 109 (!) 103  Resp:      Temp: 98.5 F (36.9 C)  98.4 F (36.9 C)   TempSrc: Oral  Oral   SpO2: 94%  96%   Weight:      Height:        Intake/Output Summary (Last 24 hours) at 01/12/2023 1230 Last data filed at 01/11/2023 1500 Gross per 24 hour  Intake 579.17 ml  Output --  Net 579.17 ml   Filed Weights   01/09/23 1401  01/09/23 2042  Weight: 65.8 kg 68.9 kg   Examination:  General exam: Appears calm and comfortable  Respiratory system: Clear to auscultation. Respiratory effort normal. Cardiovascular system: normal S1 & S2 heard. No JVD, murmurs, rubs, gallops or clicks. No pedal edema. Gastrointestinal system: Abdomen is tense, with guarding. Epigastric tenderness.  No organomegaly or masses felt. Normal bowel sounds heard. Central nervous system: Alert and oriented. No focal  neurological deficits. Extremities: Symmetric 5 x 5 power. Skin: No rashes, lesions or ulcers. Psychiatry: Judgement and insight appear normal. Mood & affect appropriate.   Data Reviewed: I have personally reviewed following labs and imaging studies  CBC: Recent Labs  Lab 01/09/23 1419 01/10/23 0416 01/11/23 0407 01/12/23 0436  WBC 17.9* 15.9* 11.9* 8.8  NEUTROABS  --   --  9.4* 6.7  HGB 18.3* 18.2* 14.5 12.9*  HCT 53.4* 52.2* 43.2 38.8*  MCV 94.7 92.7 94.1 96.5  PLT 159 143* 112* 125*    Basic Metabolic Panel: Recent Labs  Lab 01/09/23 1419 01/10/23 0416 01/11/23 0407 01/11/23 0756 01/12/23 0436  NA 137 137 133*  --  134*  K 3.6 3.5 3.9  --  3.5  CL 96* 99 98  --  97*  CO2 26 22 28   --  28  GLUCOSE 108* 111* 93  --  80  BUN 11 14 12   --  8  CREATININE 0.80 0.71 0.69  --  0.59*  CALCIUM 9.6 9.2 8.3*  --  8.4*  MG  --  1.5*  --  2.2  --   PHOS  --  3.6  --   --   --     CBG: Recent Labs  Lab 01/11/23 0718  GLUCAP 110*    Recent Results (from the past 240 hour(s))  Culture, blood (Routine X 2) w Reflex to ID Panel     Status: None (Preliminary result)   Collection Time: 01/11/23  9:38 PM   Specimen: Left Antecubital; Blood  Result Value Ref Range Status   Specimen Description LEFT ANTECUBITAL  Final   Special Requests   Final    BOTTLES DRAWN AEROBIC AND ANAEROBIC Blood Culture adequate volume   Culture   Final    NO GROWTH < 12 HOURS Performed at [facility], [address], [place], [place] [address]    Report Status PENDING  Incomplete  Culture, blood (Routine X 2) w Reflex to ID Panel     Status: None (Preliminary result)   Collection Time: 01/11/23  9:49 PM   Specimen: BLOOD RIGHT FOREARM  Result Value Ref Range Status   Specimen Description BLOOD RIGHT FOREARM  Final   Special Requests   Final    BOTTLES DRAWN AEROBIC AND ANAEROBIC Blood Culture adequate volume   Culture   Final    NO GROWTH < 12 HOURS Performed at [facility], [address] [place], [place] [address]    Report Status PENDING  Incomplete     Radiology Studies: No results found.  Scheduled Meds:  chlordiazePOXIDE  25 mg Oral TID   enoxaparin (LOVENOX) injection  40 mg Subcutaneous Q24H   folic acid  1 mg Oral Daily   metoprolol tartrate  5 mg Intravenous Q6H   multivitamin with minerals  1 tablet Oral Daily   nicotine  21 mg Transdermal Daily   thiamine  100 mg Oral Daily   Or   thiamine  100 mg Intravenous Daily   Continuous Infusions:  [START ON 01/13/2023] lactated ringers  150 mL/hr at 01/12/23 1134    LOS: 3 days   Time spent: 58 mins  Angles Trevizo Laural Benes, MD How to contact the [facility] Attending or Consulting provider 7A - 7P or covering provider during after hours 7P -7A, for this patient?  Check the care team in [facility] and look for a) attending/consulting [facility] provider listed and b) the [facility] team listed Log into [email or web address] to find provider on call.  Locate the [facility] provider you are looking for under [facility] and page to a number that you can be directly reached. If you still have difficulty reaching the provider, please page the [facility] (Director on Call) for the Hospitalists listed on amion for assistance.  01/12/2023, 12:30 PM

## 2023-01-13 ENCOUNTER — Inpatient Hospital Stay (HOSPITAL_COMMUNITY): Payer: Medicaid Other

## 2023-01-13 DIAGNOSIS — R1013 Epigastric pain: Secondary | ICD-10-CM

## 2023-01-13 LAB — COMPREHENSIVE METABOLIC PANEL
ALT: 20 U/L (ref 0–44)
AST: 28 U/L (ref 15–41)
Albumin: 2.6 g/dL — ABNORMAL LOW (ref 3.5–5.0)
Alkaline Phosphatase: 56 U/L (ref 38–126)
Anion gap: 11 (ref 5–15)
BUN: <5 mg/dL — ABNORMAL LOW (ref 6–20)
CO2: 27 mmol/L (ref 22–32)
Calcium: 8.5 mg/dL — ABNORMAL LOW (ref 8.9–10.3)
Chloride: 100 mmol/L (ref 98–111)
Creatinine, Ser: 0.53 mg/dL — ABNORMAL LOW (ref 0.61–1.24)
GFR, Estimated: >60 mL/min (ref >60)
Glucose, Bld: 88 mg/dL (ref 70–99)
Potassium: 3.5 mmol/L (ref 3.5–5.1)
Sodium: 138 mmol/L (ref 135–145)
Total Bilirubin: 1.1 mg/dL (ref <1.2)
Total Protein: 5.7 g/dL — ABNORMAL LOW (ref 6.5–8.1)

## 2023-01-13 LAB — CBC WITH DIFFERENTIAL/PLATELET
Abs Immature Granulocytes: 0.06 10*3/uL (ref 0.00–0.07)
Basophils Absolute: 0 10*3/uL (ref 0.0–0.1)
Basophils Relative: 0 %
Eosinophils Absolute: 0.2 10*3/uL (ref 0.0–0.5)
Eosinophils Relative: 2 %
HCT: 36.9 % — ABNORMAL LOW (ref 39.0–52.0)
Hemoglobin: 12.4 g/dL — ABNORMAL LOW (ref 13.0–17.0)
Immature Granulocytes: 1 %
Lymphocytes Relative: 12 %
Lymphs Abs: 1.1 10*3/uL (ref 0.7–4.0)
MCH: 32 pg (ref 26.0–34.0)
MCHC: 33.6 g/dL (ref 30.0–36.0)
MCV: 95.3 fL (ref 80.0–100.0)
Monocytes Absolute: 1.4 10*3/uL — ABNORMAL HIGH (ref 0.1–1.0)
Monocytes Relative: 16 %
Neutro Abs: 6 10*3/uL (ref 1.7–7.7)
Neutrophils Relative %: 69 %
Platelets: 184 10*3/uL (ref 150–400)
RBC: 3.87 MIL/uL — ABNORMAL LOW (ref 4.22–5.81)
RDW: 12 % (ref 11.5–15.5)
WBC: 8.6 10*3/uL (ref 4.0–10.5)
nRBC: 0 % (ref 0.0–0.2)

## 2023-01-13 LAB — MAGNESIUM: Magnesium: 2 mg/dL (ref 1.7–2.4)

## 2023-01-13 MED ORDER — CHLORDIAZEPOXIDE HCL 5 MG PO CAPS: 25.0000 mg | ORAL_CAPSULE | Freq: Every day | ORAL | Status: DC

## 2023-01-13 MED ORDER — POTASSIUM CHLORIDE CRYS ER 20 MEQ PO TBCR
40.0000 meq | EXTENDED_RELEASE_TABLET | Freq: Once | ORAL | Status: AC
  Administered 2023-01-13: 40 meq via ORAL
  Filled 2023-01-13: qty 2

## 2023-01-13 MED ORDER — HYDROMORPHONE HCL 1 MG/ML IJ SOLN
0.5000 mg | INTRAMUSCULAR | Status: DC | PRN
  Administered 2023-01-13 – 2023-01-15 (×10): 0.5 mg via INTRAVENOUS
  Filled 2023-01-13 (×10): qty 0.5

## 2023-01-13 MED ORDER — CHLORDIAZEPOXIDE HCL 5 MG PO CAPS
25.0000 mg | ORAL_CAPSULE | Freq: Three times a day (TID) | ORAL | Status: AC
  Administered 2023-01-13 (×2): 25 mg via ORAL
  Filled 2023-01-13 (×2): qty 5

## 2023-01-13 MED ORDER — CHLORDIAZEPOXIDE HCL 5 MG PO CAPS
25.0000 mg | ORAL_CAPSULE | Freq: Two times a day (BID) | ORAL | Status: AC
  Administered 2023-01-14 (×2): 25 mg via ORAL
  Filled 2023-01-13 (×2): qty 5

## 2023-01-13 MED ORDER — CHLORDIAZEPOXIDE HCL 5 MG PO CAPS
10.0000 mg | ORAL_CAPSULE | Freq: Three times a day (TID) | ORAL | Status: DC
  Administered 2023-01-15: 10 mg via ORAL
  Filled 2023-01-13: qty 2

## 2023-01-13 NOTE — Plan of Care (Signed)
Problem: Education: Goal: Knowledge of General Education information will improve Description: Including pain rating scale, medication(s)/side effects and non-pharmacologic comfort measures Outcome: Progressing   Problem: Nutrition: Goal: Adequate nutrition will be maintained Outcome: Progressing   Problem: Coping: Goal: Level of anxiety will decrease Outcome: Progressing   Problem: Education: Goal: Knowledge of General Education information will improve Description: Including pain rating scale, medication(s)/side effects and non-pharmacologic comfort measures Outcome: Progressing   Problem: Nutrition: Goal: Adequate nutrition will be maintained Outcome: Progressing   Problem: Coping: Goal: Level of anxiety will decrease Outcome: Progressing

## 2023-01-13 NOTE — Progress Notes (Signed)
Subjective: Slowly improving. No nausea or vomiting today.  Abdominal pain continues, currently 8 out of 10, but last dose of Dilaudid was around 6 AM (now close to 12 pm).  Interval between pain medication seems to be lengthening.  Tolerating liquid diet well with no worsening of abdominal pain though he states he is not ready to advance his diet.  Objective: Vital signs in last 24 hours: Temp:  [98.1 F (36.7 C)-99.5 F (37.5 C)] 99.5 F (37.5 C) (11/26 0810) Pulse Rate:  [87-115] 96 (11/26 0810) Resp:  [18] 18 (11/25 1230) BP: (127-148)/(86-102) 127/88 (11/26 0810) SpO2:  [96 %-100 %] 96 % (11/26 0810) Last BM Date : 01/12/23 General:   Alert and oriented, pleasant, NAD Head:  Normocephalic and atraumatic. Eyes:  No icterus, sclera clear. Conjuctiva pink.  Abdomen:  Bowel sounds present, soft, non-distended.  Very mild generalized tenderness to palpation.  No rebound or guarding.  Msk:  Symmetrical without gross deformities. Normal posture. Extremities:  Without edema. Neurologic:  Alert and  oriented x4;  grossly normal neurologically. Skin:  Warm and dry, intact without significant lesions.  Psych:  Normal mood and affect.  Intake/Output from previous day: 11/25 0701 - 11/26 0700 In: 1809.1 [I.V.:1809.1] Out: -  Intake/Output this shift: No intake/output data recorded.  Lab Results: Recent Labs    01/11/23 0407 01/12/23 0436 01/13/23 0432  WBC 11.9* 8.8 8.6  HGB 14.5 12.9* 12.4*  HCT 43.2 38.8* 36.9*  PLT 112* 125* 184   BMET Recent Labs    01/11/23 0407 01/12/23 0436 01/13/23 0432  NA 133* 134* 138  K 3.9 3.5 3.5  CL 98 97* 100  CO2 28 28 27   GLUCOSE 93 80 88  BUN 12 8 <5*  CREATININE 0.69 0.59* 0.53*  CALCIUM 8.3* 8.4* 8.5*   LFT Recent Labs    01/11/23 0407 01/12/23 0436 01/13/23 0432  PROT 5.4* 5.7* 5.7*  ALBUMIN 2.9* 2.6* 2.6*  AST 36 30 28  ALT 22 19 20   ALKPHOS 51 52 56  BILITOT 1.0 1.3* 1.1    Studies/Results: US Abdomen  Limited RUQ (LIVER/GB)  Result Date: 01/13/2023 CLINICAL DATA:  Pancreatitis EXAM: ULTRASOUND ABDOMEN LIMITED RIGHT UPPER QUADRANT COMPARISON:  CT 01/09/2023 FINDINGS: Gallbladder: Distended gallbladder. No wall thickening or adjacent fluid. No shadowing stones. The possible stone seen on CT scan is not well defined on this ultrasound examination. Common bile duct: Diameter: 3 mm Liver: Echogenic hepatic parenchyma consistent with fatty liver infiltration. Portal vein is patent on color Doppler imaging with normal direction of blood flow towards the liver. Other: Small pleural effusions are seen. IMPRESSION: Distended gallbladder. No stone seen by today's ultrasound. No wall thickening or adjacent fluid. No ductal dilatation. Fatty liver infiltration. Small pleural effusions. Electronically Signed   By: Karen Kays M.D.   On: 01/13/2023 11:09    Assessment: 27 y.o. male with a history of chronic, heavy alcohol use, presenting with acute pancreatitis complicated by small non-occlusive splenic vein thrombus.    Etiology appears to be alcohol related. Questionable gallstone on CT, but RUQ [place] this morning with no evidence of gallstone, wall thickening, adjacent fluid, or ductal dilation.  It did note fatty infiltration of the liver and small pleural effusions.  Pleural effusions likely secondary to the large amount of IV fluids that have been given since admission. Fluids decreased to 75 cc/hr yesterday from 150cc/hr and previously 200 cc per hour.   Clinically, he is slowly improving.  Tolerating a clear  liquid diet well.  Nausea/vomiting has resolved.  Continues to have mild generalized abdominal pain, but this is also slowly improving in interval between pain medication is increasing. T max over the last 24 hours 99.5. Blood cultures negative x 2 days.  Leukocytosis resolved.   Remains on CIWA protocol.    Plan:  Continue clear liquid diet today.  Hopeful advancement to soft diet tomorrow. Can stop  IV fluids as he is tolerating clear liquids.  Continue supportive measures.  Continue thiamine, CIWA protocol, pain support No need for reimaging unless worsening abdominal pain Recommend absolute alcohol cessation going forward. Counseled on this.  Outpatient follow-up for hepatic steatosis and can have dedicated pancreatic imaging in about 6-8 weeks    LOS: 4 days    01/13/2023, 11:40 AM   Ermalinda Memos, PA-C [facility]

## 2023-01-13 NOTE — Progress Notes (Signed)
PROGRESS NOTE   Bryan Edwards  UJW:[id number] DOB: 12/31/1995 DOA: 01/09/2023 PCP: Pcp, No   Chief Complaint  Patient presents with   Abdominal Pain   Level of care: Telemetry  Brief Admission History:  27 y.o. male, past medical history of heavy alcohol abuse, tobacco abuse, patient presents to ED secondary to complaints of nausea, vomiting and abdominal pain, for last 3 weeks, patient reports patient started gradually, he was able to keep some food, and he kept drinking, but had much progressed recently, reports started to vomit yesterday, but he had multiple episodes of vomiting today, he does report abdominal pain as well, patient reports history of heavy alcohol abuse, he has been drinking for last 9 years, multiple beers and shots every day, reports this morning he was unable to keep the shot where he did vomited, he came to ED for further evaluation, he denies fever, chills, diarrhea, constipation, sick contacts. -In ED patient was noted to be tachycardic, hypertensive, labs were significant for elevated lipase at 2735, AST elevated at 67, ALT elevated at 52, and his total bili at 1.3, alk phos within normal limit at 90, white blood cell count elevated at 17.9 K, but his urine is negative, the abdomen pelvis significant for acute pancreatitis, splenic vein thrombosis, no evidence of cholelithiasis, bile duct within normal limit, but questionable gallstone, [facility] hospitalist consulted to admit.   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 wanted to defer to GI recommendation -  further recs per GI team  Severe Alcohol Abuse  - started on CIWA protocol  - scheduled librium 25 mg TID ordered, titrate down over next couple of days if continues to improve - thiamine, folic acid, MVI ordered - urine toxicology negative for recreational substances   Sinus tachycardia  - secondary to above - improving with supportive care   Elevated blood pressures/HTN - added lopressor IV every 6 hours with holding parameters - BPs much better controlled   Transaminitis  - secondary to severe alcohol abuse - LFTs trending down with supportive care  - abd [place]: fatty liver, distended gallbladder seen  Tobacco abuse - nicotine patch ordered   Leukocytosis - resolved  - suspect reactive - treated supportively - WBC normalized    DVT prophylaxis: enoxaparin Code Status: Full  Family Communication: bedside update 11/23, 11/24, 11/25, 11/26 Disposition: anticipate home when medically stabilized   Consultants:  GI  Procedures:  Abd [place]: distended gallbladder, fatty liver infiltration  Antimicrobials:    Subjective: Pt tolerating clear liquids, abdominal pain is starting to improve.   Objective: Vitals:   01/12/23 2044 01/13/23 0056 01/13/23 0435 01/13/23 0810  BP: (!) 134/90 132/86 134/87 127/88  Pulse: (!) 105 87 99 96  Resp:      Temp: 98.5 F (36.9 C) 98.6 F (37 C) 98.4 F (36.9 C) 99.5 F (37.5 C)  TempSrc: Oral Oral Oral Oral  SpO2: 97% 97% 97% 96%  Weight:      Height:        Intake/Output Summary (Last 24 hours) at 01/13/2023 1155 Last data filed at 01/13/2023 0502  Gross per 24 hour  Intake 1809.09 ml  Output --  Net 1809.09 ml   Filed Weights   01/09/23 1401 01/09/23 2042  Weight: 65.8 kg 68.9 kg   Examination:  General exam: Appears calm and comfortable  Respiratory system: Clear to auscultation. Respiratory effort normal. Cardiovascular system: normal S1 & S2 heard. No JVD, murmurs, rubs, gallops or clicks. No pedal  edema. Gastrointestinal system: Abdomen is tense, with guarding. Epigastric tenderness.  No organomegaly or masses felt. Normal bowel sounds heard. Central nervous system: Alert and oriented. No focal neurological deficits. Extremities: Symmetric 5 x 5 power. Skin: No rashes, lesions or ulcers. Psychiatry: Judgement and insight appear normal. Mood & affect appropriate.   Data Reviewed: I have personally reviewed following labs and imaging studies  CBC: Recent Labs  Lab 01/09/23 1419 01/10/23 0416 01/11/23 0407 01/12/23 0436 01/13/23 0432  WBC 17.9* 15.9* 11.9* 8.8 8.6  NEUTROABS  --   --  9.4* 6.7 6.0  HGB 18.3* 18.2* 14.5 12.9* 12.4*  HCT 53.4* 52.2* 43.2 38.8* 36.9*  MCV 94.7 92.7 94.1 96.5 95.3  PLT 159 143* 112* 125* 184    Basic Metabolic Panel: Recent Labs  Lab 01/09/23 1419 01/10/23 0416 01/11/23 0407 01/11/23 0756 01/12/23 0436 01/13/23 0432  NA 137 137 133*  --  134* 138  K 3.6 3.5 3.9  --  3.5 3.5  CL 96* 99 98  --  97* 100  CO2 26 22 28   --  28 27  GLUCOSE 108* 111* 93  --  80 88  BUN 11 14 12   --  8 <5*  CREATININE 0.80 0.71 0.69  --  0.59* 0.53*  CALCIUM 9.6 9.2 8.3*  --  8.4* 8.5*  MG  --  1.5*  --  2.2  --   --   PHOS  --  3.6  --   --   --   --     CBG: Recent Labs  Lab 01/11/23 0718  GLUCAP 110*    Recent Results (from the past 240 hour(s))  Culture, blood (Routine X 2) w Reflex to ID Panel     Status: None (Preliminary result)   Collection Time: 01/11/23  9:38 PM   Specimen: Left Antecubital; Blood  Result Value Ref Range Status   Specimen Description LEFT ANTECUBITAL  Final   Special Requests   Final    BOTTLES DRAWN AEROBIC AND ANAEROBIC Blood Culture adequate volume   Culture   Final    NO GROWTH 2 DAYS Performed at [facility], [address] [place], [place] [address]    Report Status PENDING  Incomplete  Culture, blood (Routine X 2) w Reflex to ID Panel     Status: None (Preliminary result)   Collection Time: 01/11/23  9:49  PM   Specimen: BLOOD RIGHT FOREARM  Result Value Ref Range Status   Specimen Description BLOOD RIGHT FOREARM  Final   Special Requests   Final    BOTTLES DRAWN AEROBIC AND ANAEROBIC Blood Culture adequate volume   Culture  Setup Time NO ORGANISMS SEEN ANAEROBIC BOTTLE ONLY   Final   Culture   Final    NO GROWTH 2 DAYS Performed at [facility], [address] [place], [place] [address]    Report Status PENDING  Incomplete     Radiology Studies: US Abdomen Limited RUQ (LIVER/GB)  Result Date: 01/13/2023 CLINICAL DATA:  Pancreatitis EXAM: ULTRASOUND ABDOMEN LIMITED RIGHT UPPER QUADRANT COMPARISON:  CT 01/09/2023 FINDINGS: Gallbladder: Distended gallbladder.  No wall thickening or adjacent fluid. No shadowing stones. The possible stone seen on CT scan is not well defined on this ultrasound examination. Common bile duct: Diameter: 3 mm Liver: Echogenic hepatic parenchyma consistent with fatty liver infiltration. Portal vein is patent on color Doppler imaging with normal direction of blood flow towards the liver. Other: Small pleural effusions are seen. IMPRESSION: Distended gallbladder. No stone seen by today's ultrasound. No wall thickening or adjacent fluid. No ductal dilatation. Fatty liver infiltration. Small pleural effusions. Electronically Signed   By: Karen Kays M.D.   On: 01/13/2023 11:09    Scheduled Meds:  chlordiazePOXIDE  25 mg Oral TID   enoxaparin (LOVENOX) injection  40 mg Subcutaneous Q24H   folic acid  1 mg Oral Daily   metoprolol tartrate  5 mg Intravenous Q6H   multivitamin with minerals  1 tablet Oral Daily   nicotine  21 mg Transdermal Daily   thiamine  100 mg Oral Daily   Or   thiamine  100 mg Intravenous Daily   Continuous Infusions:  lactated ringers 100 mL/hr at 01/13/23 0926    LOS: 4 days   Time spent: 55 mins  Kathern Lobosco Laural Benes, MD How to contact the [facility] Attending or Consulting provider 7A - 7P or covering provider during after hours 7P -7A, for this  patient?  Check the care team in [facility] and look for a) attending/consulting [facility] provider listed and b) the [facility] team listed Log into [email or web address] to find provider on call.  Locate the [facility] provider you are looking for under [facility] and page to a number that you can be directly reached. If you still have difficulty reaching the provider, please page the [facility] (Director on Call) for the Hospitalists listed on amion for assistance.  01/13/2023, 11:55 AM

## 2023-01-13 NOTE — Plan of Care (Signed)

## 2023-01-14 DIAGNOSIS — I82890 Acute embolism and thrombosis of other specified veins: Secondary | ICD-10-CM

## 2023-01-14 DIAGNOSIS — Z7901 Long term (current) use of anticoagulants: Secondary | ICD-10-CM

## 2023-01-14 HISTORY — DX: Acute embolism and thrombosis of other specified veins: I82.890

## 2023-01-14 LAB — COMPREHENSIVE METABOLIC PANEL
ALT: 31 U/L (ref 0–44)
AST: 36 U/L (ref 15–41)
Albumin: 2.6 g/dL — ABNORMAL LOW (ref 3.5–5.0)
Alkaline Phosphatase: 54 U/L (ref 38–126)
Anion gap: 7 (ref 5–15)
BUN: <5 mg/dL — ABNORMAL LOW (ref 6–20)
CO2: 28 mmol/L (ref 22–32)
Calcium: 8.2 mg/dL — ABNORMAL LOW (ref 8.9–10.3)
Chloride: 102 mmol/L (ref 98–111)
Creatinine, Ser: 0.69 mg/dL (ref 0.61–1.24)
GFR, Estimated: >60 mL/min (ref >60)
Glucose, Bld: 100 mg/dL — ABNORMAL HIGH (ref 70–99)
Potassium: 3.3 mmol/L — ABNORMAL LOW (ref 3.5–5.1)
Sodium: 137 mmol/L (ref 135–145)
Total Bilirubin: 0.7 mg/dL (ref <1.2)
Total Protein: 5.8 g/dL — ABNORMAL LOW (ref 6.5–8.1)

## 2023-01-14 LAB — CBC
HCT: 35.3 % — ABNORMAL LOW (ref 39.0–52.0)
Hemoglobin: 11.5 g/dL — ABNORMAL LOW (ref 13.0–17.0)
MCH: 32.1 pg (ref 26.0–34.0)
MCHC: 32.6 g/dL (ref 30.0–36.0)
MCV: 98.6 fL (ref 80.0–100.0)
Platelets: 233 10*3/uL (ref 150–400)
RBC: 3.58 MIL/uL — ABNORMAL LOW (ref 4.22–5.81)
RDW: 12.3 % (ref 11.5–15.5)
WBC: 8.8 10*3/uL (ref 4.0–10.5)
nRBC: 0 % (ref 0.0–0.2)

## 2023-01-14 LAB — MAGNESIUM: Magnesium: 2 mg/dL (ref 1.7–2.4)

## 2023-01-14 MED ORDER — APIXABAN 5 MG PO TABS: 5.0000 mg | ORAL_TABLET | Freq: Two times a day (BID) | ORAL | Status: DC

## 2023-01-14 MED ORDER — POTASSIUM CHLORIDE CRYS ER 20 MEQ PO TBCR
40.0000 meq | EXTENDED_RELEASE_TABLET | Freq: Once | ORAL | Status: AC
  Administered 2023-01-14: 40 meq via ORAL
  Filled 2023-01-14: qty 2

## 2023-01-14 MED ORDER — METOPROLOL TARTRATE 25 MG PO TABS
12.5000 mg | ORAL_TABLET | Freq: Two times a day (BID) | ORAL | Status: DC
  Administered 2023-01-14 – 2023-01-15 (×2): 12.5 mg via ORAL
  Filled 2023-01-14 (×2): qty 1

## 2023-01-14 MED ORDER — APIXABAN 5 MG PO TABS
10.0000 mg | ORAL_TABLET | Freq: Two times a day (BID) | ORAL | Status: DC
  Administered 2023-01-14 – 2023-01-15 (×3): 10 mg via ORAL
  Filled 2023-01-14 (×3): qty 2

## 2023-01-14 NOTE — Plan of Care (Signed)
  Problem: Education: Goal: Knowledge of General Education information will improve Description Including pain rating scale, medication(s)/side effects and non-pharmacologic comfort measures Outcome: Progressing   Problem: Education: Goal: Knowledge of General Education information will improve Description Including pain rating scale, medication(s)/side effects and non-pharmacologic comfort measures Outcome: Progressing   

## 2023-01-14 NOTE — Progress Notes (Signed)
PHARMACY - ANTICOAGULATION CONSULT NOTE  Pharmacy Consult for apixaban   Indication: Splenic vein thrombosis  Allergies  Allergen Reactions   Amoxicillin Other (See Comments)    Makes tongue and throat hurt, dries it out   Bee Pollen Other (See Comments)    Seasonal allergies   Penicillins Swelling    Patient Measurements: Height: 5\' 10"  (177.8 cm) Weight: 68.9 kg (151 lb 14.4 oz) IBW/kg (Calculated) : 73 Heparin Dosing Weight:   Vital Signs: Temp: 98.7 F (37.1 C) (11/27 0848) Temp Source: Oral (11/27 0848) BP: 122/88 (11/27 1132) Pulse Rate: 64 (11/27 1132)  Labs: Recent Labs    01/12/23 0436 01/13/23 0432 01/14/23 0435  HGB 12.9* 12.4* 11.5*  HCT 38.8* 36.9* 35.3*  PLT 125* 184 233  CREATININE 0.59* 0.53* 0.69    Estimated Creatinine Clearance: 132.8 mL/min (by C-G formula based on SCr of 0.69 mg/dL).   Medical History: Past Medical History:  Diagnosis Date   Anxiety    Heart murmur     Medications:  Medications Prior to Admission  Medication Sig Dispense Refill Last Dose   Multiple Vitamin (MULTIVITAMIN) tablet Take 1 tablet by mouth daily.   Past Week    Assessment: Pharmacy consulted to dose apixaban in patient with splenic vein thrombosis. Plan is for 3 months of anticoagulation.  CBC WNL  Goal of Therapy:   Monitor platelets by anticoagulation protocol: Yes   Plan:  Apixaban 10 mg twice daily x 7 days followed by apixaban 5 mg twice daily for 3 months of therapy Monitor H&H and s/s of bleeding.  Judeth Cornfield, PharmD Clinical Pharmacist 01/14/2023 1:11 PM

## 2023-01-14 NOTE — Discharge Instructions (Signed)

## 2023-01-14 NOTE — Progress Notes (Signed)
Subjective: Continues to improve slowly. Mild abdominal pain continues. No nausea or vomiting. Tolerating clear liquids well and is ready to try a soft diet.   Noticed dark bruising in his flanks last night. Seems to be lightening up this morning. No recent fall. No tenderness or swelling.   Objective: Vital signs in last 24 hours: Temp:  [98.4 F (36.9 C)-99.2 F (37.3 C)] 98.7 F (37.1 C) (11/27 0848) Pulse Rate:  [77-88] 88 (11/27 0848) Resp:  [18-20] 20 (11/26 1656) BP: (114-142)/(80-98) 127/87 (11/27 0848) SpO2:  [97 %-99 %] 99 % (11/27 0848) Last BM Date : 01/12/23 General:   Alert and oriented, pleasant, appears to be feeling better today, sitting on the side of the bed.  Head:  Normocephalic and atraumatic. Abdomen:  Bowel sounds present, soft, non-distended, mild generalized tenderness. No rebound or guarding. Fairly significant bruising noted in bilateral flanks, most prominent in let flank.  Msk:  Symmetrical without gross deformities. Normal posture. Extremities:  Without edema. Neurologic:  Alert and  oriented x4;  grossly normal neurologically. Psych:  Normal mood and affect.  Intake/Output from previous day: 11/26 0701 - 11/27 0700 In: 1183.8 [I.V.:1183.8] Out: -  Intake/Output this shift: No intake/output data recorded.  Lab Results: Recent Labs    01/12/23 0436 01/13/23 0432  WBC 8.8 8.6  HGB 12.9* 12.4*  HCT 38.8* 36.9*  PLT 125* 184   BMET Recent Labs    01/12/23 0436 01/13/23 0432 01/14/23 0435  NA 134* 138 137  K 3.5 3.5 3.3*  CL 97* 100 102  CO2 28 27 28   GLUCOSE 80 88 100*  BUN 8 <5* <5*  CREATININE 0.59* 0.53* 0.69  CALCIUM 8.4* 8.5* 8.2*   LFT Recent Labs    01/12/23 0436 01/13/23 0432 01/14/23 0435  PROT 5.7* 5.7* 5.8*  ALBUMIN 2.6* 2.6* 2.6*  AST 30 28 36  ALT 19 20 31   ALKPHOS 52 56 54  BILITOT 1.3* 1.1 0.7    Studies/Results: US Abdomen Limited RUQ (LIVER/GB)  Result Date: 01/13/2023 CLINICAL DATA:   Pancreatitis EXAM: ULTRASOUND ABDOMEN LIMITED RIGHT UPPER QUADRANT COMPARISON:  CT 01/09/2023 FINDINGS: Gallbladder: Distended gallbladder. No wall thickening or adjacent fluid. No shadowing stones. The possible stone seen on CT scan is not well defined on this ultrasound examination. Common bile duct: Diameter: 3 mm Liver: Echogenic hepatic parenchyma consistent with fatty liver infiltration. Portal vein is patent on color Doppler imaging with normal direction of blood flow towards the liver. Other: Small pleural effusions are seen. IMPRESSION: Distended gallbladder. No stone seen by today's ultrasound. No wall thickening or adjacent fluid. No ductal dilatation. Fatty liver infiltration. Small pleural effusions. Electronically Signed   By: Karen Kays M.D.   On: 01/13/2023 11:09    Assessment: 27 y.o. male with a history of chronic, heavy alcohol use, presenting with acute pancreatitis complicated by small non-occlusive splenic vein thrombus.    Etiology appears to be alcohol related. Questionable gallstone on CT, but RUQ [place] yesterday with no evidence of gallstone. He was found to have small pleural effusions and IV fluids were discontinues as he was tolerating a clear liquid diet.   He continues to improve slowly. No nausea or vomiting and continues to tolerate clears well, ready to try soft diet. Abdominal pain improving and no worsening with clears. Continues to require IV pain medication, but interval is increasing averaging every 4-6 hours. Leukocytosis resolved yesterday. No fever in the last 24 hours. Blood cultures negative so far. Notably,  I did note significant bruising in his bilateral flanks today that he reports first noticing last night. There is no swelling or tenderness. This appears consistent grey turners related to pancreatitis.   Plan: Advance to soft diet today.  Continue supportive measures/pain control.  Continue thiamine, CIWA protocol. No need for reimaging unless worsening  abdominal pain Recommend absolute alcohol cessation going forward. Counseled on this.  Outpatient follow-up for hepatic steatosis and can have dedicated pancreatic imaging in about 6-8 weeks;.   LOS: 5 days    01/14/2023, 9:41 AM   Ermalinda Memos, PA-C [facility]

## 2023-01-14 NOTE — Progress Notes (Signed)
PROGRESS NOTE  Bryan Edwards IHK:[id number] DOB: Jul 31, 1995 DOA: 01/09/2023 PCP: Pcp, No  Brief History:  27 y.o. male, past medical history of heavy alcohol abuse, tobacco abuse, patient presents to ED secondary to complaints of nausea, vomiting and abdominal pain, for last 3 weeks, patient reports patient started gradually, he was able to keep some food, and he kept drinking, but had much progressed recently, reports started to vomit yesterday, but he had multiple episodes of vomiting today, he does report abdominal pain as well, patient reports history of heavy alcohol abuse, he has been drinking for last 9 years, multiple beers and shots every day, reports this morning he was unable to keep the shot where he did vomited, he came to ED for further evaluation, he denies fever, chills, diarrhea, constipation, sick contacts. -In ED patient was noted to be tachycardic, hypertensive, labs were significant for elevated lipase at 2735, AST elevated at 67, ALT elevated at 52, and his total bili at 1.3, alk phos within normal limit at 90, white blood cell count elevated at 17.9 K, but his urine is negative, the abdomen pelvis significant for acute pancreatitis, splenic vein thrombosis, no evidence of cholelithiasis, bile duct within normal limit, but questionable gallstone, [facility]ist consulted to admit.   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 Rollene Fare 3 months anticogulation -She will have pt follow up in clinic after d/c   Severe Alcohol Abuse  - started on  CIWA protocol  - scheduled librium 25 mg TID ordered, titrate down over next couple of days - thiamine, folic acid, MVI ordered - urine toxicology negative for recreational substances  - no signs of withdrawal presently   Sinus tachycardia  - secondary to above - pt was started on IV lopressor initially - wean to po metoprolol - improving with supportive care    Elevated blood pressures/HTN - added lopressor IV every 6 hours with holding parameters - BPs much better controlled  - transition to po metoprolol   Transaminitis  - secondary to severe alcohol abuse - LFTs trending down with supportive care  - abd [place]: fatty liver, distended gallbladder seen, no stones, no wall thickening   Tobacco abuse - nicotine patch ordered    Leukocytosis - resolved  - suspect reactive - treated supportively - WBC normalized       Family Communication:   mother updated 11/27  Consultants:  GI  Code Status:  FULL  DVT Prophylaxis: apixaban   Procedures: As Listed in Progress Note Above  Antibiotics: None      Subjective: Pt states abdominal pain is improving.  Denies f/c, cp, sob, n/v/d.    Objective: Vitals:   01/14/23 0000 01/14/23 0445 01/14/23 0848 01/14/23 1132  BP: 114/80 122/87 127/87 122/88  Pulse: 88 77 88 64  Resp:      Temp: 98.4 F (36.9 C) 98.4 F (36.9 C) 98.7 F (37.1 C)   TempSrc: Oral Oral Oral   SpO2: 97% 99% 99%   Weight:      Height:  Intake/Output Summary (Last 24 hours) at 01/14/2023 1245 Last data filed at 01/13/2023 2030 Gross per 24 hour  Intake 1183.75 ml  Output --  Net 1183.75 ml   Weight change:  Exam:  General:  Pt is alert, follows commands appropriately, not in acute distress HEENT: No icterus, No thrush, No neck mass, [place]/AT Cardiovascular: RRR, S1/S2, no rubs, no gallops Respiratory: CTA bilaterally, no wheezing, no crackles, no rhonchi Abdomen: Soft/+BS, upper abd tender, non distended, no guarding Extremities:  No edema, No lymphangitis, No petechiae, No rashes, no synovitis   Data Reviewed: I have personally reviewed following labs and imaging studies Basic Metabolic Panel: Recent Labs  Lab 01/10/23 0416 01/11/23 0407 01/11/23 0756 01/12/23 0436 01/13/23 0432 01/14/23 0435  NA 137 133*  --  134* 138 137  K 3.5 3.9  --  3.5 3.5 3.3*  CL 99 98  --  97* 100 102  CO2 22 28  --  28 27 28   GLUCOSE 111* 93  --  80 88 100*  BUN 14 12  --  8 <5* <5*  CREATININE 0.71 0.69  --  0.59* 0.53* 0.69  CALCIUM 9.2 8.3*  --  8.4* 8.5* 8.2*  MG 1.5*  --  2.2  --  2.0 2.0  PHOS 3.6  --   --   --   --   --    Liver Function Tests: Recent Labs  Lab 01/10/23 0416 01/11/23 0407 01/12/23 0436 01/13/23 0432 01/14/23 0435  AST 56* 36 30 28 36  ALT 34 22 19 20 31   ALKPHOS 59 51 52 56 54  BILITOT 1.2* 1.0 1.3* 1.1 0.7  PROT 6.5 5.4* 5.7* 5.7* 5.8*  ALBUMIN 3.7 2.9* 2.6* 2.6* 2.6*   Recent Labs  Lab 01/09/23 1419 01/10/23 0753 01/11/23 0407 01/12/23 0436  LIPASE 2,735* 2,256* 683* 176*   No results for input(s): "AMMONIA" in the last 168 hours. Coagulation Profile: No results for input(s): "INR", "PROTIME" in the last 168 hours. CBC: Recent Labs  Lab 01/10/23 0416 01/11/23 0407 01/12/23 0436 01/13/23 0432 01/14/23 0435  WBC 15.9* 11.9* 8.8 8.6 8.8  NEUTROABS  --  9.4* 6.7 6.0  --   HGB 18.2* 14.5 12.9* 12.4* 11.5*  HCT 52.2* 43.2 38.8* 36.9* 35.3*  MCV 92.7 94.1 96.5 95.3 98.6  PLT 143* 112* 125* 184 233   Cardiac Enzymes: No results for input(s): "CKTOTAL", "CKMB", "CKMBINDEX", "TROPONINI" in the last 168 hours. BNP: Invalid input(s): "POCBNP" CBG: Recent Labs  Lab 01/11/23 0718  GLUCAP 110*   HbA1C: No results for input(s): "HGBA1C" in the last 72 hours. Urine analysis:    Component Value Date/Time   COLORURINE YELLOW 01/09/2023 1414   APPEARANCEUR CLEAR 01/09/2023 1414   LABSPEC 1.021 01/09/2023 1414   PHURINE 6.0 01/09/2023 1414   GLUCOSEU NEGATIVE 01/09/2023 1414    HGBUR NEGATIVE 01/09/2023 1414   BILIRUBINUR NEGATIVE 01/09/2023 1414   KETONESUR 20 (A) 01/09/2023 1414   PROTEINUR 100 (A) 01/09/2023 1414   NITRITE NEGATIVE 01/09/2023 1414   LEUKOCYTESUR NEGATIVE 01/09/2023 1414   Sepsis Labs: @LABRCNTIP (procalcitonin:4,lacticidven:4) ) Recent Results (from the past 240 hour(s))  Culture, blood (Routine X 2) w Reflex to ID Panel     Status: None (Preliminary result)   Collection Time: 01/11/23  9:38 PM   Specimen: Left Antecubital; Blood  Result Value Ref Range Status   Specimen Description LEFT ANTECUBITAL  Final   Special Requests   Final    BOTTLES DRAWN AEROBIC AND ANAEROBIC Blood  Culture adequate volume   Culture   Final    NO GROWTH 2 DAYS Performed at [facility], [address] [place], [place] [address]    Report Status PENDING  Incomplete  Culture, blood (Routine X 2) w Reflex to ID Panel     Status: None (Preliminary result)   Collection Time: 01/11/23  9:49 PM   Specimen: BLOOD RIGHT FOREARM  Result Value Ref Range Status   Specimen Description BLOOD RIGHT FOREARM  Final   Special Requests   Final    BOTTLES DRAWN AEROBIC AND ANAEROBIC Blood Culture adequate volume   Culture  Setup Time NO ORGANISMS SEEN ANAEROBIC BOTTLE ONLY   Final   Culture   Final    NO GROWTH 2 DAYS Performed at [facility], [address], [place], [place] [address]    Report Status PENDING  Incomplete     Scheduled Meds:  chlordiazePOXIDE  25 mg Oral BID   Followed by   Melene Muller ON 01/15/2023] chlordiazePOXIDE  10 mg Oral TID   Followed by   Melene Muller ON 01/16/2023] chlordiazePOXIDE  25 mg Oral QHS   folic acid  1 mg Oral Daily   metoprolol tartrate  5 mg Intravenous Q6H   metoprolol tartrate  12.5 mg Oral BID   multivitamin with minerals  1 tablet Oral Daily   nicotine  21 mg Transdermal Daily   thiamine  100 mg Oral Daily   Or   thiamine  100 mg Intravenous Daily   Continuous Infusions:  Procedures/Studies: US Abdomen Limited RUQ  (LIVER/GB)  Result Date: 01/13/2023 CLINICAL DATA:  Pancreatitis EXAM: ULTRASOUND ABDOMEN LIMITED RIGHT UPPER QUADRANT COMPARISON:  CT 01/09/2023 FINDINGS: Gallbladder: Distended gallbladder. No wall thickening or adjacent fluid. No shadowing stones. The possible stone seen on CT scan is not well defined on this ultrasound examination. Common bile duct: Diameter: 3 mm Liver: Echogenic hepatic parenchyma consistent with fatty liver infiltration. Portal vein is patent on color Doppler imaging with normal direction of blood flow towards the liver. Other: Small pleural effusions are seen. IMPRESSION: Distended gallbladder. No stone seen by today's ultrasound. No wall thickening or adjacent fluid. No ductal dilatation. Fatty liver infiltration. Small pleural effusions. Electronically Signed   By: Karen Kays M.D.   On: 01/13/2023 11:09   CT ABDOMEN PELVIS W CONTRAST  Result Date: 01/09/2023 CLINICAL DATA:  Abdominal pain, acute, nonlocalized w/ vomiting EXAM: CT ABDOMEN AND PELVIS WITH CONTRAST TECHNIQUE: Multidetector CT imaging of the abdomen and pelvis was performed using the standard protocol following bolus administration of intravenous contrast. RADIATION DOSE REDUCTION: This exam was performed according to the departmental dose-optimization program which includes automated exposure control, adjustment of the mA and/or kV according to patient size and/or use of iterative reconstruction technique. CONTRAST:  OMNIPAQUE IOHEXOL 300 MG/ML  SOLN COMPARISON:  None Available. FINDINGS: Lower chest: The lung bases are clear. No pleural effusion. The heart is normal in size. No pericardial effusion. Hepatobiliary: The liver is normal in size. Non-cirrhotic configuration. No suspicious mass. These is mild diffuse hepatic steatosis. No intrahepatic or extrahepatic bile duct dilation. No choledocholithiasis. There is ? single, sub 5 mm hyperattenuating gallstone without imaging signs of acute cholecystitis.  Normal gallbladder wall thickness. No pericholecystic inflammatory changes. Pancreas: There is diffuse heterogeneous pancreas with asymmetrically thickened and bulky pancreatic tail and distal body. There is hyperattenuation of the pancreatic head and comparatively less attenuation of the bulky portion of the pancreatic tail, which may be due to extensive interstitial edema  however, underlying developing pancreatic necrosis (occupying less than 30% of the total parenchyma) can not be completely excluded on this exam. There is peripancreatic fat stranding and moderate amount of peripancreatic fluid extending into the bilateral paracolic gutters. Spleen: Within normal limits. No focal lesion. There is very small volume nonocclusive thrombus in the splenic vein just before the confluence with superior mesenteric vein (series 2, image 33). Adrenals/Urinary Tract: Adrenal glands are unremarkable. No suspicious renal mass. No hydronephrosis. No renal or ureteric calculi. Unremarkable urinary bladder. Stomach/Bowel: No disproportionate dilation of the small or large bowel loops. No evidence of abnormal bowel wall thickening or inflammatory changes. The appendix is unremarkable. Vascular/Lymphatic: No pneumoperitoneum. There is moderate amount of free fluid mainly in the peripancreatic region with extension into the bilateral paracolic gutters, right more than left. No abdominal or pelvic lymphadenopathy, by size criteria. No aneurysmal dilation of the major abdominal arteries. Reproductive: Normal size prostate. Symmetric seminal vesicles. Other: The visualized soft tissues and abdominal wall are unremarkable. Musculoskeletal: No suspicious osseous lesions. IMPRESSION: *Extensive interstitial pancreatitis, predominantly involving the distal pancreatic body and tail, with relative less enhancement of the pancreatic tail, as discussed in detail above. There is significant peripancreatic fluid collection. *Small volume  nonocclusive thrombus noted in the splenic vein. *Multiple other nonacute observations, as described above. Electronically Signed   By: Jules Schick M.D.   On: 01/09/2023 19:29    Catarina Hartshorn, DO  [facility]  If 7PM-7AM, please contact night-coverage [email or web address] Password [email or web address] 01/14/2023, 12:45 PM   LOS: 5 days

## 2023-01-14 NOTE — Hospital Course (Signed)
27 y.o. male, past medical history of heavy alcohol abuse, tobacco abuse, patient presents to ED secondary to complaints of nausea, vomiting and abdominal pain, for last 3 weeks, patient reports patient started gradually, he was able to keep some food, and he kept drinking, but had much progressed recently, reports started to vomit yesterday, but he had multiple episodes of vomiting today, he does report abdominal pain as well, patient reports history of heavy alcohol abuse, he has been drinking for last 9 years, multiple beers and shots every day, reports this morning he was unable to keep the shot where he did vomited, he came to ED for further evaluation, he denies fever, chills, diarrhea, constipation, sick contacts. -In ED patient was noted to be tachycardic, hypertensive, labs were significant for elevated lipase at 2735, AST elevated at 67, ALT elevated at 52, and his total bili at 1.3, alk phos within normal limit at 90, white blood cell count elevated at 17.9 K, but his urine is negative, the abdomen pelvis significant for acute pancreatitis, splenic vein thrombosis, no evidence of cholelithiasis, bile duct within normal limit, but questionable gallstone, [facility] hospitalist consulted to admit.

## 2023-01-15 LAB — COMPREHENSIVE METABOLIC PANEL
ALT: 40 U/L (ref 0–44)
AST: 33 U/L (ref 15–41)
Albumin: 2.7 g/dL — ABNORMAL LOW (ref 3.5–5.0)
Alkaline Phosphatase: 56 U/L (ref 38–126)
Anion gap: 9 (ref 5–15)
BUN: <5 mg/dL — ABNORMAL LOW (ref 6–20)
CO2: 26 mmol/L (ref 22–32)
Calcium: 8.6 mg/dL — ABNORMAL LOW (ref 8.9–10.3)
Chloride: 104 mmol/L (ref 98–111)
Creatinine, Ser: 0.65 mg/dL (ref 0.61–1.24)
GFR, Estimated: >60 mL/min (ref >60)
Glucose, Bld: 90 mg/dL (ref 70–99)
Potassium: 3.4 mmol/L — ABNORMAL LOW (ref 3.5–5.1)
Sodium: 139 mmol/L (ref 135–145)
Total Bilirubin: 0.5 mg/dL (ref <1.2)
Total Protein: 5.9 g/dL — ABNORMAL LOW (ref 6.5–8.1)

## 2023-01-15 LAB — CBC
HCT: 35.5 % — ABNORMAL LOW (ref 39.0–52.0)
Hemoglobin: 11.6 g/dL — ABNORMAL LOW (ref 13.0–17.0)
MCH: 31.4 pg (ref 26.0–34.0)
MCHC: 32.7 g/dL (ref 30.0–36.0)
MCV: 96.2 fL (ref 80.0–100.0)
Platelets: 312 10*3/uL (ref 150–400)
RBC: 3.69 MIL/uL — ABNORMAL LOW (ref 4.22–5.81)
RDW: 12 % (ref 11.5–15.5)
WBC: 9.4 10*3/uL (ref 4.0–10.5)
nRBC: 0 % (ref 0.0–0.2)

## 2023-01-15 LAB — MAGNESIUM: Magnesium: 2 mg/dL (ref 1.7–2.4)

## 2023-01-15 MED ORDER — POTASSIUM CHLORIDE CRYS ER 20 MEQ PO TBCR
40.0000 meq | EXTENDED_RELEASE_TABLET | Freq: Once | ORAL | Status: AC
  Administered 2023-01-15: 40 meq via ORAL
  Filled 2023-01-15: qty 2

## 2023-01-15 MED ORDER — FOLIC ACID 1 MG PO TABS: 1.0000 mg | ORAL_TABLET | Freq: Every day | ORAL | Status: DC

## 2023-01-15 MED ORDER — OXYCODONE HCL 5 MG PO TABS: 5.0000 mg | ORAL_TABLET | Freq: Four times a day (QID) | ORAL | Status: DC | PRN

## 2023-01-15 MED ORDER — CHLORDIAZEPOXIDE HCL 10 MG PO CAPS: 10.0000 mg | ORAL_CAPSULE | Freq: Two times a day (BID) | ORAL | 0 refills | Status: DC

## 2023-01-15 MED ORDER — APIXABAN 5 MG PO TABS: ORAL_TABLET | ORAL | 2 refills | Status: DC

## 2023-01-15 MED ORDER — OXYCODONE HCL 5 MG PO TABS: 5.0000 mg | ORAL_TABLET | Freq: Four times a day (QID) | ORAL | 0 refills | Status: DC | PRN

## 2023-01-15 MED ORDER — METOPROLOL TARTRATE 25 MG PO TABS: 12.5000 mg | ORAL_TABLET | Freq: Two times a day (BID) | ORAL | 1 refills | Status: DC

## 2023-01-15 MED ORDER — CHLORDIAZEPOXIDE HCL 5 MG PO CAPS: 10.0000 mg | ORAL_CAPSULE | Freq: Two times a day (BID) | ORAL | Status: DC

## 2023-01-15 NOTE — Progress Notes (Addendum)
Gastroenterology & Hepatology   Interval History:    Patient is seen bedside this morning.  Reports pain is much better 3 out of 10 after receiving Dilaudid.  Able to have regular diet with no nausea or vomiting  Inpatient Medications:  Current Facility-Administered Medications:    acetaminophen (TYLENOL) tablet 650 mg, 650 mg, Oral, Q6H PRN, Marcelino Duster, MD, 650 mg at 01/11/23 2116   albuterol (PROVENTIL) (2.5 MG/3ML) 0.083% nebulizer solution 2.5 mg, 2.5 mg, Nebulization, Q2H PRN, Elgergawy, Leana Roe, MD   alum & mag hydroxide-simeth (MAALOX/MYLANTA) 200-200-20 MG/5ML suspension 30 mL, 30 mL, Oral, Q4H PRN, Johnson, Clanford L, MD, 30 mL at 01/11/23 1915   apixaban (ELIQUIS) tablet 10 mg, 10 mg, Oral, BID, 10 mg at 01/15/23 0857 **FOLLOWED BY** [START ON 01/21/2023] apixaban (ELIQUIS) tablet 5 mg, 5 mg, Oral, BID, Tat, David, MD   chlordiazePOXIDE (LIBRIUM) capsule 10 mg, 10 mg, Oral, BID, Tat, David, MD   folic acid (FOLVITE) tablet 1 mg, 1 mg, Oral, Daily, Elgergawy, Leana Roe, MD, 1 mg at 01/15/23 0858   hydrALAZINE (APRESOLINE) injection 5 mg, 5 mg, Intravenous, Q4H PRN, Elgergawy, Leana Roe, MD   metoprolol tartrate (LOPRESSOR) tablet 12.5 mg, 12.5 mg, Oral, BID, Tat, David, MD, 12.5 mg at 01/15/23 0857   multivitamin with minerals tablet 1 tablet, 1 tablet, Oral, Daily, Elgergawy, Leana Roe, MD, 1 tablet at 01/15/23 0858   nicotine (NICODERM CQ - dosed in mg/24 hours) patch 21 mg, 21 mg, Transdermal, Daily, Elgergawy, Leana Roe, MD, 21 mg at 01/15/23 0856   ondansetron (ZOFRAN) tablet 4 mg, 4 mg, Oral, Q6H PRN, 4 mg at 01/10/23 0905 **OR** ondansetron (ZOFRAN) injection 4 mg, 4 mg, Intravenous, Q6H PRN, Elgergawy, Leana Roe, MD, 4 mg at 01/10/23 2047   oxyCODONE (Oxy IR/ROXICODONE) immediate release tablet 5 mg, 5 mg, Oral, Q6H PRN, Tat, David, MD   thiamine (VITAMIN B1) tablet 100 mg, 100 mg, Oral, Daily, 100 mg at 01/15/23 0857 **OR** thiamine (VITAMIN B1) injection 100 mg, 100  mg, Intravenous, Daily, Elgergawy, Leana Roe, MD, 100 mg at 01/09/23 2121   I/O   No intake or output data in the 24 hours ending 01/15/23 1103   Physical Exam: Temp:  [98.3 F (36.8 C)-98.6 F (37 C)] 98.4 F (36.9 C) (11/28 0348) Pulse Rate:  [64-80] 80 (11/28 0348) Resp:  [18-19] 19 (11/27 2026) BP: (122-133)/(82-98) 126/82 (11/28 0348) SpO2:  [98 %-100 %] 98 % (11/28 0348)  Temp (24hrs), Avg:98.4 F (36.9 C), Min:98.3 F (36.8 C), Max:98.6 F (37 C)  GENERAL: The patient is AO x3, in no acute distress. HEENT: Head is normocephalic and atraumatic. EOMI are intact. Mouth is well hydrated and without lesions. NECK: Supple. No masses LUNGS: Clear to auscultation. No presence of rhonchi/wheezing/rales. Adequate chest expansion HEART: RRR, normal s1 and s2. ABDOMEN: Soft, mild epigastric tenderness, no guarding, no peritoneal signs, and nondistended. BS +. No masses.  Laboratory Data: CBC:     Component Value Date/Time   WBC 9.4 01/15/2023 0411   RBC 3.69 (L) 01/15/2023 0411   HGB 11.6 (L) 01/15/2023 0411   HCT 35.5 (L) 01/15/2023 0411   PLT 312 01/15/2023 0411   MCV 96.2 01/15/2023 0411   MCH 31.4 01/15/2023 0411   MCHC 32.7 01/15/2023 0411   RDW 12.0 01/15/2023 0411   LYMPHSABS 1.1 01/13/2023 0432   MONOABS 1.4 (H) 01/13/2023 0432   EOSABS 0.2 01/13/2023 0432   BASOSABS 0.0 01/13/2023 0432   COAG: No results found for: "  INR", "PROTIME"  BMP:     Latest Ref Rng & Units 01/15/2023    4:11 AM 01/14/2023    4:35 AM 01/13/2023    4:32 AM  BMP  Glucose 70 - 99 mg/dL 90  161  88   BUN 6 - 20 mg/dL <5  <5  <5   Creatinine 0.61 - 1.24 mg/dL 0.96  0.45  4.09   Sodium 135 - 145 mmol/L 139  137  138   Potassium 3.5 - 5.1 mmol/L 3.4  3.3  3.5   Chloride 98 - 111 mmol/L 104  102  100   CO2 22 - 32 mmol/L 26  28  27    Calcium 8.9 - 10.3 mg/dL 8.6  8.2  8.5     HEPATIC:     Latest Ref Rng & Units 01/15/2023    4:11 AM 01/14/2023    4:35 AM 01/13/2023    4:32 AM   Hepatic Function  Total Protein 6.5 - 8.1 g/dL 5.9  5.8  5.7   Albumin 3.5 - 5.0 g/dL 2.7  2.6  2.6   AST 15 - 41 U/L 33  36  28   ALT 0 - 44 U/L 40  31  20   Alk Phosphatase 38 - 126 U/L 56  54  56   Total Bilirubin <1.2 mg/dL 0.5  0.7  1.1     CARDIAC:  Lab Results  Component Value Date   CKTOTAL 169 09/03/2011   CKMB 2.4 09/03/2011   TROPONINI <0.30 09/03/2011      Imaging: I personally reviewed and interpreted the available labs, imaging and endoscopic files.   Ultrasound Distended gallbladder. No stone seen by today's ultrasound. No wall thickening or adjacent fluid. No ductal dilatation.   Fatty liver infiltration.   Small pleural effusions.  Negative IgG4 Triglyceride 85  Assessment/Plan:  This is a 27 year old male with history of alcohol use disorder presenting with acute pancreatitis complicated by small nonocclusive splenic vein thrombosis.  Patient admitted with acute pancreatitis for which GI was consulted  #Acute pancreatitis with splenic vein thrombosis  Likely etiology is alcohol induced pancreatitis, no gallstone on imaging triglyceride within normal limits and negative IgG4  Drop in hemoglobin is likely hemodilution as patient does not have any overt bleeding Patient is able to tolerate regular diet Was started on Eliquis for next 3 months as per hematology for splenic vein thrombosis; and can follow-up with hematology as outpatient as well Extensive counseling regarding alcohol cessation was given and patient seemed to understand the disease process and verbalizes that he will work on absolute alcohol cessation  -Correct electrolytes to keep potassium more than 4 magnesium more than 2.  Potassium today is 3.4 -May wean off IV narcotics in anticipation for discharge and pain control -Please have patient follow-up in the GI clinic as outpatient upon discharge  Vista Lawman, MD Gastroenterology and Hepatology [facility]  Gastroenterology   This chart has been completed using [facility] Dictation software, and while attempts have been made to ensure accuracy , certain words and phrases may not be transcribed as intended

## 2023-01-15 NOTE — Discharge Summary (Signed)
Physician Discharge Summary   Patient: Bryan Edwards MRN: [id number] DOB: 09-06-1995  Admit date:     01/09/2023  Discharge date: 01/15/23  Discharge Physician: Onalee Hua [place]   PCP: Pcp, No   Recommendations at discharge:   Please follow up with primary care provider within 1-2 weeks  Please repeat BMP and CBC in one week     Hospital Course: 27 y.o. male, past medical history of heavy alcohol abuse, tobacco abuse, patient presents to ED secondary to complaints of nausea, vomiting and abdominal pain, for last 3 weeks, patient reports patient started gradually, he was able to keep some food, and he kept drinking, but had much progressed recently, reports started to vomit yesterday, but he had multiple episodes of vomiting today, he does report abdominal pain as well, patient reports history of heavy alcohol abuse, he has been drinking for last 9 years, multiple beers and shots every day, reports this morning he was unable to keep the shot where he did vomited, he came to ED for further evaluation, he denies fever, chills, diarrhea, constipation, sick contacts. -In ED patient was noted to be tachycardic, hypertensive, labs were significant for elevated lipase at 2735, AST elevated at 67, ALT elevated at 52, and his total bili at 1.3, alk phos within normal limit at 90, white blood cell count elevated at 17.9 K, but his urine is negative, the abdomen pelvis significant for acute pancreatitis, splenic vein thrombosis, no evidence of cholelithiasis, bile duct within normal limit, but questionable gallstone, [facility] consulted to admit.  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 Rollene Fare 3 months anticogulation -She will have pt follow up in clinic after d/c   Severe Alcohol Abuse  - started on CIWA protocol  - scheduled librium 25 mg TID ordered, titrate down over next couple of days - thiamine, folic acid, MVI ordered - urine toxicology negative for recreational substances  - no signs of withdrawal presently - d/c home with librium 10mg  bid x 2 days, then q day x 1 day   Sinus tachycardia  - secondary to above - pt was started on IV lopressor initially - wean to po metoprolol - improving with supportive care    Elevated blood pressures/HTN - added lopressor IV every 6 hours with holding parameters - BPs much better controlled  - transitioned to po metoprolol   Transaminitis  - secondary to severe alcohol abuse - LFTs trending down with supportive care  - abd [place]: fatty liver, distended gallbladder seen, no stones, no wall thickening   Tobacco abuse - nicotine patch ordered    Leukocytosis - resolved  - suspect reactive - treated supportively - WBC normalized         Pain control - [place] Controlled Substance Reporting System database was reviewed. and patient was instructed, not to drive, operate heavy machinery, perform activities at heights, swimming or participation in water activities or provide baby-sitting services while on Pain, Sleep and Anxiety Medications; until  their outpatient Physician has advised to do so again. Also recommended to not to take more than prescribed Pain, Sleep and Anxiety Medications.  Consultants: GI Procedures performed: none  Disposition: Home Diet recommendation:  Soft diet DISCHARGE MEDICATION: Allergies as of 01/15/2023       Reactions   Amoxicillin Other (See Comments)   Makes  tongue and throat hurt, dries it out   Bee Pollen Other (See Comments)   Seasonal allergies   Penicillins Swelling        Medication List     TAKE these medications    apixaban 5 MG Tabs tablet Commonly known as: ELIQUIS Take 2 tablets (10 mg total) by mouth 2 (two) times daily for 6 days, THEN 1 tablet (5 mg total) 2 (two) times daily. Start taking on: January 15, 2023   chlordiazePOXIDE 10 MG capsule Commonly known as: LIBRIUM Take 1 capsule (10 mg total) by mouth 2 (two) times daily. X 2 days, then once daily starting 01/17/23   folic acid 1 MG tablet Commonly known as: FOLVITE Take 1 tablet (1 mg total) by mouth daily. Start taking on: January 16, 2023   metoprolol tartrate 25 MG tablet Commonly known as: LOPRESSOR Take 0.5 tablets (12.5 mg total) by mouth 2 (two) times daily.   multivitamin tablet Take 1 tablet by mouth daily.   oxyCODONE 5 MG immediate release tablet Commonly known as: Oxy IR/ROXICODONE Take 1 tablet (5 mg total) by mouth every 6 (six) hours as needed for moderate pain (pain score 4-6).        Follow-up Information     [facility] Follow up.   Why: To establish primary care physician. Contact information: [phone]               Discharge Exam: Filed Weights   01/09/23 1401 01/09/23 2042  Weight: 65.8 kg 68.9 kg   HEENT:  [place]/AT, No thrush, no icterus CV:  RRR, no rub, no S3, no S4 Lung:  CTA, no wheeze, no rhonchi Abd:  soft/+BS, epigastric tender.  No rebound Ext:  No edema, no lymphangitis, no synovitis, no rash   Condition at discharge: stable  The results of significant diagnostics from this hospitalization (including imaging, microbiology, ancillary and laboratory) are listed below for reference.   Imaging Studies: US Abdomen Limited RUQ (LIVER/GB)  Result Date: 01/13/2023 CLINICAL DATA:  Pancreatitis EXAM: ULTRASOUND ABDOMEN LIMITED RIGHT UPPER QUADRANT COMPARISON:  CT 01/09/2023 FINDINGS: Gallbladder:  Distended gallbladder. No wall thickening or adjacent fluid. No shadowing stones. The possible stone seen on CT scan is not well defined on this ultrasound examination. Common bile duct: Diameter: 3 mm Liver: Echogenic hepatic parenchyma consistent with fatty liver infiltration. Portal vein is patent on color Doppler imaging with normal direction of blood flow towards the liver. Other: Small pleural effusions are seen. IMPRESSION: Distended gallbladder. No stone seen by today's ultrasound. No wall thickening or adjacent fluid. No ductal dilatation. Fatty liver infiltration. Small pleural effusions. Electronically Signed   By: Karen Kays M.D.   On: 01/13/2023 11:09   CT ABDOMEN PELVIS W CONTRAST  Result Date: 01/09/2023 CLINICAL DATA:  Abdominal pain, acute, nonlocalized w/ vomiting EXAM: CT ABDOMEN AND PELVIS WITH CONTRAST TECHNIQUE: Multidetector CT imaging of the abdomen and pelvis was performed using the standard protocol following bolus administration of intravenous contrast. RADIATION DOSE REDUCTION: This exam was performed according to the departmental dose-optimization program which includes automated exposure control, adjustment of the mA and/or kV according to patient size and/or  use of iterative reconstruction technique. CONTRAST:  OMNIPAQUE IOHEXOL 300 MG/ML  SOLN COMPARISON:  None Available. FINDINGS: Lower chest: The lung bases are clear. No pleural effusion. The heart is normal in size. No pericardial effusion. Hepatobiliary: The liver is normal in size. Non-cirrhotic configuration. No suspicious mass. These is mild diffuse hepatic steatosis. No intrahepatic or extrahepatic bile duct dilation. No choledocholithiasis. There is ? single, sub 5 mm hyperattenuating gallstone without imaging signs of acute cholecystitis. Normal gallbladder wall thickness. No pericholecystic inflammatory changes. Pancreas: There is diffuse heterogeneous pancreas with asymmetrically thickened and bulky pancreatic  tail and distal body. There is hyperattenuation of the pancreatic head and comparatively less attenuation of the bulky portion of the pancreatic tail, which may be due to extensive interstitial edema however, underlying developing pancreatic necrosis (occupying less than 30% of the total parenchyma) can not be completely excluded on this exam. There is peripancreatic fat stranding and moderate amount of peripancreatic fluid extending into the bilateral paracolic gutters. Spleen: Within normal limits. No focal lesion. There is very small volume nonocclusive thrombus in the splenic vein just before the confluence with superior mesenteric vein (series 2, image 33). Adrenals/Urinary Tract: Adrenal glands are unremarkable. No suspicious renal mass. No hydronephrosis. No renal or ureteric calculi. Unremarkable urinary bladder. Stomach/Bowel: No disproportionate dilation of the small or large bowel loops. No evidence of abnormal bowel wall thickening or inflammatory changes. The appendix is unremarkable. Vascular/Lymphatic: No pneumoperitoneum. There is moderate amount of free fluid mainly in the peripancreatic region with extension into the bilateral paracolic gutters, right more than left. No abdominal or pelvic lymphadenopathy, by size criteria. No aneurysmal dilation of the major abdominal arteries. Reproductive: Normal size prostate. Symmetric seminal vesicles. Other: The visualized soft tissues and abdominal wall are unremarkable. Musculoskeletal: No suspicious osseous lesions. IMPRESSION: *Extensive interstitial pancreatitis, predominantly involving the distal pancreatic body and tail, with relative less enhancement of the pancreatic tail, as discussed in detail above. There is significant peripancreatic fluid collection. *Small volume nonocclusive thrombus noted in the splenic vein. *Multiple other nonacute observations, as described above. Electronically Signed   By: Jules Schick M.D.   On: 01/09/2023 19:29     Microbiology: Results for orders placed or performed during the hospital encounter of 01/09/23  Culture, blood (Routine X 2) w Reflex to ID Panel     Status: None (Preliminary result)   Collection Time: 01/11/23  9:38 PM   Specimen: Left Antecubital; Blood  Result Value Ref Range Status   Specimen Description LEFT ANTECUBITAL  Final   Special Requests   Final    BOTTLES DRAWN AEROBIC AND ANAEROBIC Blood Culture adequate volume   Culture   Final    NO GROWTH 4 DAYS Performed at [facility], [address], [place], [place] [address]    Report Status PENDING  Incomplete  Culture, blood (Routine X 2) w Reflex to ID Panel     Status: None (Preliminary result)   Collection Time: 01/11/23  9:49 PM   Specimen: BLOOD RIGHT FOREARM  Result Value Ref Range Status   Specimen Description BLOOD RIGHT FOREARM  Final   Special Requests   Final    BOTTLES DRAWN AEROBIC AND ANAEROBIC Blood Culture adequate volume   Culture  Setup Time NO ORGANISMS SEEN ANAEROBIC BOTTLE ONLY   Final   Culture   Final    NO GROWTH 4 DAYS Performed at [facility], [address], [place], [place] [address]    Report Status PENDING  Incomplete  Labs: CBC: Recent Labs  Lab 01/11/23 0407 01/12/23 0436 01/13/23 0432 01/14/23 0435 01/15/23 0411  WBC 11.9* 8.8 8.6 8.8 9.4  NEUTROABS 9.4* 6.7 6.0  --   --   HGB 14.5 12.9* 12.4* 11.5* 11.6*  HCT 43.2 38.8* 36.9* 35.3* 35.5*  MCV 94.1 96.5 95.3 98.6 96.2  PLT 112* 125* 184 233 312   Basic Metabolic Panel: Recent Labs  Lab 01/10/23 0416 01/11/23 0407 01/11/23 0756 01/12/23 0436 01/13/23 0432 01/14/23 0435 01/15/23 0411  NA 137 133*  --  134* 138 137 139  K 3.5 3.9  --  3.5 3.5 3.3* 3.4*  CL 99 98  --  97* 100 102 104  CO2 22 28  --  28 27 28 26   GLUCOSE 111* 93  --  80 88 100* 90  BUN 14 12  --  8 <5* <5* <5*  CREATININE 0.71 0.69  --  0.59* 0.53* 0.69 0.65  CALCIUM 9.2 8.3*  --  8.4* 8.5* 8.2* 8.6*  MG 1.5*  --  2.2  --  2.0 2.0 2.0  PHOS  3.6  --   --   --   --   --   --    Liver Function Tests: Recent Labs  Lab 01/11/23 0407 01/12/23 0436 01/13/23 0432 01/14/23 0435 01/15/23 0411  AST 36 30 28 36 33  ALT 22 19 20 31  40  ALKPHOS 51 52 56 54 56  BILITOT 1.0 1.3* 1.1 0.7 0.5  PROT 5.4* 5.7* 5.7* 5.8* 5.9*  ALBUMIN 2.9* 2.6* 2.6* 2.6* 2.7*   CBG: Recent Labs  Lab 01/11/23 0718  GLUCAP 110*    Discharge time spent: greater than 30 minutes.  Signed: Catarina Hartshorn, MD [facility] 01/15/2023

## 2023-01-16 LAB — CULTURE, BLOOD (ROUTINE X 2)
Culture  Setup Time: NONE SEEN
Culture: NO GROWTH
Culture: NO GROWTH
Special Requests: ADEQUATE
Special Requests: ADEQUATE

## 2023-01-20 ENCOUNTER — Telehealth: Payer: Self-pay

## 2023-03-05 ENCOUNTER — Inpatient Hospital Stay: Payer: MEDICAID

## 2023-03-05 ENCOUNTER — Inpatient Hospital Stay: Payer: MEDICAID | Attending: Hematology | Admitting: Hematology

## 2023-03-05 NOTE — Progress Notes (Incomplete)
[facility] [address] [place] [address]   Clinic Day:  03/05/2023  Referring physician: Catarina Hartshorn, MD  Patient Care Team: Pcp, No as PCP - General   ASSESSMENT & PLAN:   Assessment: ***  Plan: ***  No orders of the defined types were placed in this encounter.     Alben Deeds Teague,acting as a Neurosurgeon for Doreatha Massed, MD.,have documented all relevant documentation on the behalf of Doreatha Massed, MD,as directed by  Doreatha Massed, MD while in the presence of Doreatha Massed, MD.   ***  [place] R Teague   1/16/20259:16 AM  CHIEF COMPLAINT/PURPOSE OF CONSULT:   Diagnosis: ***  Current Therapy:  ***  HISTORY OF PRESENT ILLNESS:   Jabali is a 28 y.o. male presenting to clinic today for evaluation of splenic vein thrombosis at the request of Tat, David, MD.  Patient was admitted to the hospital from 01/09/23 to 01/15/23 for acute alcoholic pancreatitis and splenic vein thrombosis. He was given IV fluids and advanced to a soft diet on 11/27. For his splenic vein thrombosis, he was prescribed 3 months of Eliquis 10 mg BID x 6 days, then 5 mg BID.   Today, he states that he is doing well overall. His appetite level is at ***%. His energy level is at ***%.  PAST MEDICAL HISTORY:   Past Medical History: Past Medical History:  Diagnosis Date   Anxiety    Heart murmur     Surgical History: No past surgical history on file.  Social History: Social History   Socioeconomic History   Marital status: Single    Spouse name: Not on file   Number of children: Not on file   Years of education: Not on file   Highest education level: Not on file  Occupational History   Not on file  Tobacco Use   Smoking status: Every Day    Current packs/day: 1.00    Types: Cigarettes   Smokeless tobacco: Not on file  Vaping Use   Vaping status: Never Used  Substance and Sexual Activity   Alcohol use: Not Currently    Comment: few  times weekly   Drug use: No   Sexual activity: Not on file  Other Topics Concern   Not on file  Social History Narrative   Not on file   Social Drivers of Health   Financial Resource Strain: Not on file  Food Insecurity: Unknown (01/09/2023)   Hunger Vital Sign    Worried About Running Out of Food in the Last Year: Not on file    Ran Out of Food in the Last Year: Never true  Transportation Needs: No Transportation Needs (01/09/2023)   PRAPARE - Administrator, Civil Service (Medical): No    Lack of Transportation (Non-Medical): No  Physical Activity: Not on file  Stress: Not on file  Social Connections: Not on file  Intimate Partner Violence: Not At Risk (01/09/2023)   Humiliation, Afraid, Rape, and Kick questionnaire    Fear of Current or Ex-Partner: No    Emotionally Abused: No    Physically Abused: No    Sexually Abused: No    Family History: No family history on file.  Current Medications:  Current Outpatient Medications:    apixaban (ELIQUIS) 5 MG TABS tablet, Take 2 tablets (10 mg total) by mouth 2 (two) times daily for 6 days, THEN 1 tablet (5 mg total) 2 (two) times daily., Disp: 60 tablet, Rfl: 2  chlordiazePOXIDE (LIBRIUM) 10 MG capsule, Take 1 capsule (10 mg total) by mouth 2 (two) times daily. X 2 days, then once daily starting 01/17/23, Disp: 5 capsule, Rfl: 0   folic acid (FOLVITE) 1 MG tablet, Take 1 tablet (1 mg total) by mouth daily., Disp: , Rfl:    metoprolol tartrate (LOPRESSOR) 25 MG tablet, Take 0.5 tablets (12.5 mg total) by mouth 2 (two) times daily., Disp: 60 tablet, Rfl: 1   Multiple Vitamin (MULTIVITAMIN) tablet, Take 1 tablet by mouth daily., Disp: , Rfl:    oxyCODONE (OXY IR/ROXICODONE) 5 MG immediate release tablet, Take 1 tablet (5 mg total) by mouth every 6 (six) hours as needed for moderate pain (pain score 4-6)., Disp: 12 tablet, Rfl: 0   Allergies: Allergies  Allergen Reactions   Amoxicillin Other (See Comments)    Makes  tongue and throat hurt, dries it out   Bee Pollen Other (See Comments)    Seasonal allergies   Penicillins Swelling    REVIEW OF SYSTEMS:   Review of Systems  Constitutional:  Negative for chills, fatigue and fever.  HENT:   Negative for lump/mass, mouth sores, nosebleeds, sore throat and trouble swallowing.   Eyes:  Negative for eye problems.  Respiratory:  Negative for cough and shortness of breath.   Cardiovascular:  Negative for chest pain, leg swelling and palpitations.  Gastrointestinal:  Negative for abdominal pain, constipation, diarrhea, nausea and vomiting.  Genitourinary:  Negative for bladder incontinence, difficulty urinating, dysuria, frequency, hematuria and nocturia.   Musculoskeletal:  Negative for arthralgias, back pain, flank pain, myalgias and neck pain.  Skin:  Negative for itching and rash.  Neurological:  Negative for dizziness, headaches and numbness.  Hematological:  Does not bruise/bleed easily.  Psychiatric/Behavioral:  Negative for depression, sleep disturbance and suicidal ideas. The patient is not nervous/anxious.   All other systems reviewed and are negative.    VITALS:   There were no vitals taken for this visit.  Wt Readings from Last 3 Encounters:  01/09/23 151 lb 14.4 oz (68.9 kg)  11/27/22 140 lb (63.5 kg)  05/01/21 175 lb (79.4 kg)    There is no height or weight on file to calculate BMI.   PHYSICAL EXAM:   Physical Exam Vitals and nursing note reviewed. Exam conducted with a chaperone present.  Constitutional:      Appearance: Normal appearance.  Cardiovascular:     Rate and Rhythm: Normal rate and regular rhythm.     Pulses: Normal pulses.     Heart sounds: Normal heart sounds.  Pulmonary:     Effort: Pulmonary effort is normal.     Breath sounds: Normal breath sounds.  Abdominal:     Palpations: Abdomen is soft. There is no hepatomegaly, splenomegaly or mass.     Tenderness: There is no abdominal tenderness.  Musculoskeletal:      Right lower leg: No edema.     Left lower leg: No edema.  Lymphadenopathy:     Cervical: No cervical adenopathy.     Right cervical: No superficial, deep or posterior cervical adenopathy.    Left cervical: No superficial, deep or posterior cervical adenopathy.     Upper Body:     Right upper body: No supraclavicular or axillary adenopathy.     Left upper body: No supraclavicular or axillary adenopathy.  Neurological:     General: No focal deficit present.     Mental Status: He is alert and oriented to person, place, and time.  Psychiatric:  Mood and Affect: Mood normal.        Behavior: Behavior normal.     LABS:   CBC    Component Value Date/Time   WBC 9.4 01/15/2023 0411   RBC 3.69 (L) 01/15/2023 0411   HGB 11.6 (L) 01/15/2023 0411   HCT 35.5 (L) 01/15/2023 0411   PLT 312 01/15/2023 0411   MCV 96.2 01/15/2023 0411   MCH 31.4 01/15/2023 0411   MCHC 32.7 01/15/2023 0411   RDW 12.0 01/15/2023 0411   LYMPHSABS 1.1 01/13/2023 0432   MONOABS 1.4 (H) 01/13/2023 0432   EOSABS 0.2 01/13/2023 0432   BASOSABS 0.0 01/13/2023 0432    CMP    Component Value Date/Time   NA 139 01/15/2023 0411   K 3.4 (L) 01/15/2023 0411   CL 104 01/15/2023 0411   CO2 26 01/15/2023 0411   GLUCOSE 90 01/15/2023 0411   BUN <5 (L) 01/15/2023 0411   CREATININE 0.65 01/15/2023 0411   CALCIUM 8.6 (L) 01/15/2023 0411   PROT 5.9 (L) 01/15/2023 0411   ALBUMIN 2.7 (L) 01/15/2023 0411   AST 33 01/15/2023 0411   ALT 40 01/15/2023 0411   ALKPHOS 56 01/15/2023 0411   BILITOT 0.5 01/15/2023 0411   GFRNONAA >60 01/15/2023 0411   GFRAA >90 09/03/2011 1444    No results found for: "CEA1", "CEA" / No results found for: "CEA1", "CEA" No results found for: "PSA1" No results found for: "HKV425" No results found for: "CAN125"  No results found for: "TOTALPROTELP", "ALBUMINELP", "A1GS", "A2GS", "BETS", "BETA2SER", "GAMS", "MSPIKE", "SPEI" No results found for: "TIBC", "FERRITIN", "IRONPCTSAT" No  results found for: "LDH"   STUDIES:   No results found.

## 2023-04-16 ENCOUNTER — Inpatient Hospital Stay (HOSPITAL_COMMUNITY)
Admission: EM | Admit: 2023-04-16 | Discharge: 2023-04-19 | DRG: 439 | Disposition: A | Discharge status: Home / Self Care | Payer: Medicaid Other | Attending: Internal Medicine | Admitting: Internal Medicine

## 2023-04-16 ENCOUNTER — Encounter (HOSPITAL_COMMUNITY): Payer: Self-pay

## 2023-04-16 ENCOUNTER — Other Ambulatory Visit: Payer: Self-pay

## 2023-04-16 ENCOUNTER — Emergency Department (HOSPITAL_COMMUNITY): Payer: Medicaid Other

## 2023-04-16 DIAGNOSIS — K852 Alcohol induced acute pancreatitis without necrosis or infection: Principal | ICD-10-CM | POA: Diagnosis present

## 2023-04-16 DIAGNOSIS — K8581 Other acute pancreatitis with uninfected necrosis: Secondary | ICD-10-CM | POA: Diagnosis not present

## 2023-04-16 DIAGNOSIS — Z91030 Bee allergy status: Secondary | ICD-10-CM

## 2023-04-16 DIAGNOSIS — R188 Other ascites: Secondary | ICD-10-CM | POA: Diagnosis present

## 2023-04-16 DIAGNOSIS — Z7901 Long term (current) use of anticoagulants: Secondary | ICD-10-CM | POA: Diagnosis not present

## 2023-04-16 DIAGNOSIS — K561 Intussusception: Secondary | ICD-10-CM | POA: Diagnosis present

## 2023-04-16 DIAGNOSIS — F17210 Nicotine dependence, cigarettes, uncomplicated: Secondary | ICD-10-CM | POA: Diagnosis present

## 2023-04-16 DIAGNOSIS — F419 Anxiety disorder, unspecified: Secondary | ICD-10-CM | POA: Diagnosis present

## 2023-04-16 DIAGNOSIS — Z880 Allergy status to penicillin: Secondary | ICD-10-CM

## 2023-04-16 DIAGNOSIS — F1010 Alcohol abuse, uncomplicated: Secondary | ICD-10-CM | POA: Diagnosis present

## 2023-04-16 DIAGNOSIS — K828 Other specified diseases of gallbladder: Secondary | ICD-10-CM | POA: Diagnosis present

## 2023-04-16 DIAGNOSIS — Z86718 Personal history of other venous thrombosis and embolism: Secondary | ICD-10-CM

## 2023-04-16 DIAGNOSIS — K859 Acute pancreatitis without necrosis or infection, unspecified: Principal | ICD-10-CM

## 2023-04-16 DIAGNOSIS — R1013 Epigastric pain: Secondary | ICD-10-CM

## 2023-04-16 DIAGNOSIS — K760 Fatty (change of) liver, not elsewhere classified: Secondary | ICD-10-CM | POA: Diagnosis present

## 2023-04-16 DIAGNOSIS — Z79899 Other long term (current) drug therapy: Secondary | ICD-10-CM

## 2023-04-16 HISTORY — DX: Acute pancreatitis without necrosis or infection, unspecified: K85.90

## 2023-04-16 LAB — COMPREHENSIVE METABOLIC PANEL
ALT: 20 U/L (ref 0–44)
AST: 27 U/L (ref 15–41)
Albumin: 5 g/dL (ref 3.5–5.0)
Alkaline Phosphatase: 83 U/L (ref 38–126)
Anion gap: 19 — ABNORMAL HIGH (ref 5–15)
BUN: 11 mg/dL (ref 6–20)
CO2: 20 mmol/L — ABNORMAL LOW (ref 22–32)
Calcium: 9.8 mg/dL (ref 8.9–10.3)
Chloride: 96 mmol/L — ABNORMAL LOW (ref 98–111)
Creatinine, Ser: 0.67 mg/dL (ref 0.61–1.24)
GFR, Estimated: >60 mL/min (ref >60)
Glucose, Bld: 96 mg/dL (ref 70–99)
Potassium: 4 mmol/L (ref 3.5–5.1)
Sodium: 135 mmol/L (ref 135–145)
Total Bilirubin: 1.1 mg/dL (ref 0.0–1.2)
Total Protein: 7.8 g/dL (ref 6.5–8.1)

## 2023-04-16 LAB — CBC WITH DIFFERENTIAL/PLATELET
Abs Immature Granulocytes: 0.1 10*3/uL — ABNORMAL HIGH (ref 0.00–0.07)
Basophils Absolute: 0.1 10*3/uL (ref 0.0–0.1)
Basophils Relative: 1 %
Eosinophils Absolute: 0 10*3/uL (ref 0.0–0.5)
Eosinophils Relative: 0 %
HCT: 52.3 % — ABNORMAL HIGH (ref 39.0–52.0)
Hemoglobin: 18.6 g/dL — ABNORMAL HIGH (ref 13.0–17.0)
Immature Granulocytes: 1 %
Lymphocytes Relative: 11 %
Lymphs Abs: 2.2 10*3/uL (ref 0.7–4.0)
MCH: 30.3 pg (ref 26.0–34.0)
MCHC: 35.6 g/dL (ref 30.0–36.0)
MCV: 85.3 fL (ref 80.0–100.0)
Monocytes Absolute: 1.7 10*3/uL — ABNORMAL HIGH (ref 0.1–1.0)
Monocytes Relative: 9 %
Neutro Abs: 15.8 10*3/uL — ABNORMAL HIGH (ref 1.7–7.7)
Neutrophils Relative %: 78 %
Platelets: 292 10*3/uL (ref 150–400)
RBC: 6.13 MIL/uL — ABNORMAL HIGH (ref 4.22–5.81)
RDW: 12.9 % (ref 11.5–15.5)
WBC: 19.9 10*3/uL — ABNORMAL HIGH (ref 4.0–10.5)
nRBC: 0 % (ref 0.0–0.2)

## 2023-04-16 LAB — URINALYSIS, ROUTINE W REFLEX MICROSCOPIC
Bilirubin Urine: NEGATIVE
Glucose, UA: NEGATIVE mg/dL
Ketones, ur: 5 mg/dL — AB
Leukocytes,Ua: NEGATIVE
Nitrite: NEGATIVE
Protein, ur: 30 mg/dL — AB
Specific Gravity, Urine: 1.014 (ref 1.005–1.030)
pH: 5 (ref 5.0–8.0)

## 2023-04-16 LAB — LIPASE, BLOOD: Lipase: 710 U/L — ABNORMAL HIGH (ref 11–51)

## 2023-04-16 MED ORDER — ONDANSETRON HCL 4 MG/2ML IJ SOLN
4.0000 mg | Freq: Once | INTRAMUSCULAR | Status: AC
  Administered 2023-04-16: 4 mg via INTRAVENOUS
  Filled 2023-04-16: qty 2

## 2023-04-16 MED ORDER — HYDROMORPHONE HCL 1 MG/ML IJ SOLN
0.5000 mg | Freq: Once | INTRAMUSCULAR | Status: AC
  Administered 2023-04-16: 0.5 mg via INTRAVENOUS
  Filled 2023-04-16: qty 0.5

## 2023-04-16 MED ORDER — SODIUM CHLORIDE 0.9 % IV BOLUS
1000.0000 mL | Freq: Once | INTRAVENOUS | Status: AC
  Administered 2023-04-16: 1000 mL via INTRAVENOUS

## 2023-04-16 MED ORDER — IOHEXOL 300 MG/ML  SOLN
100.0000 mL | Freq: Once | INTRAMUSCULAR | Status: AC | PRN
  Administered 2023-04-16: 100 mL via INTRAVENOUS

## 2023-04-16 MED ORDER — ALUM & MAG HYDROXIDE-SIMETH 200-200-20 MG/5ML PO SUSP
30.0000 mL | Freq: Once | ORAL | Status: AC
  Administered 2023-04-16: 30 mL via ORAL
  Filled 2023-04-16: qty 30

## 2023-04-16 MED ORDER — LORAZEPAM 2 MG/ML IJ SOLN
1.0000 mg | Freq: Once | INTRAMUSCULAR | Status: AC
  Administered 2023-04-16: 1 mg via INTRAVENOUS
  Filled 2023-04-16: qty 1

## 2023-04-16 MED ORDER — HYDROMORPHONE HCL 1 MG/ML IJ SOLN
1.0000 mg | Freq: Once | INTRAMUSCULAR | Status: AC
  Administered 2023-04-16: 1 mg via INTRAVENOUS
  Filled 2023-04-16: qty 1

## 2023-04-16 MED ORDER — LACTATED RINGERS IV SOLN: INTRAVENOUS | Status: AC

## 2023-04-16 NOTE — ED Provider Notes (Signed)
 [place] EMERGENCY DEPARTMENT AT [facility] Provider Note   CSN: [id number] Arrival date & time: 04/16/23  1605     History  Chief Complaint  Patient presents with   Abdominal Edwards    Bryan Edwards is a 28 y.o. male.  Patient is a 28 year old male who presents to the emergency department with a chief complaint of epigastric abdominal Edwards which has been ongoing since early this morning.  He notes his symptoms feel consistent with his previous bouts of pancreatitis.  He denies any previous abdominal surgeries.  He has had no recent falls or blunt abdominal wall trauma.  He admits to associated nausea without vomiting.  He has had no associated diarrhea or constipation.  There is been no fever or chills.   Abdominal Edwards      Home Medications Prior to Admission medications   Medication Sig Start Date End Date Taking? Authorizing Provider  apixaban (ELIQUIS) 5 MG TABS tablet Take 2 tablets (10 mg total) by mouth 2 (two) times daily for 6 days, THEN 1 tablet (5 mg total) 2 (two) times daily. 01/15/23 04/21/23  Catarina Hartshorn, MD  chlordiazePOXIDE (LIBRIUM) 10 MG capsule Take 1 capsule (10 mg total) by mouth 2 (two) times daily. X 2 days, then once daily starting 01/17/23 01/15/23   Tat, Onalee Hua, MD  folic acid (FOLVITE) 1 MG tablet Take 1 tablet (1 mg total) by mouth daily. 01/16/23   Catarina Hartshorn, MD  metoprolol tartrate (LOPRESSOR) 25 MG tablet Take 0.5 tablets (12.5 mg total) by mouth 2 (two) times daily. 01/15/23   Catarina Hartshorn, MD  Multiple Vitamin (MULTIVITAMIN) tablet Take 1 tablet by mouth daily.    [provider]  oxyCODONE (OXY IR/ROXICODONE) 5 MG immediate release tablet Take 1 tablet (5 mg total) by mouth every 6 (six) hours as needed for moderate Edwards (Edwards score 4-6). 01/15/23   Catarina Hartshorn, MD      Allergies    Amoxicillin, Bee pollen, and Penicillins    Review of Systems   Review of Systems  Gastrointestinal:  Positive for abdominal Edwards.  All other  systems reviewed and are negative.   Physical Exam Updated Vital Signs BP (!) 164/115 (BP Location: Right Arm)   Pulse (!) 114   Temp 98.4 F (36.9 C) (Oral)   Resp 18   Ht 5\' 10"  (1.778 m)   Wt 64.9 kg   SpO2 99%   BMI 20.52 kg/m  Physical Exam Vitals reviewed.  Constitutional:      Appearance: Normal appearance.  HENT:     Head: Normocephalic and atraumatic.     Nose: Nose normal.     Mouth/Throat:     Mouth: Mucous membranes are moist.  Eyes:     Extraocular Movements: Extraocular movements intact.     Conjunctiva/sclera: Conjunctivae normal.     Pupils: Pupils are equal, round, and reactive to light.  Cardiovascular:     Rate and Rhythm: Normal rate and regular rhythm.     Pulses: Normal pulses.     Heart sounds: Normal heart sounds.  Pulmonary:     Effort: Pulmonary effort is normal.     Breath sounds: Normal breath sounds.  Abdominal:     General: Abdomen is flat. Bowel sounds are normal.     Palpations: Abdomen is soft.     Comments: Palpation to epigastric region, no distention or guarding  Musculoskeletal:        General: Normal range of motion.  Cervical back: Normal range of motion and neck supple.  Skin:    General: Skin is warm and dry.  Neurological:     General: No focal deficit present.     Mental Status: He is alert and oriented to person, place, and time. Mental status is at baseline.  Psychiatric:        Mood and Affect: Mood normal.        Behavior: Behavior normal.        Thought Content: Thought content normal.        Judgment: Judgment normal.     ED Results / Procedures / Treatments   Labs (all labs ordered are listed, but only abnormal results are displayed) Labs Reviewed  LIPASE, BLOOD  COMPREHENSIVE METABOLIC PANEL  URINALYSIS, ROUTINE W REFLEX MICROSCOPIC  CBC WITH DIFFERENTIAL/PLATELET    EKG None  Radiology No results found.  Procedures Procedures    Medications Ordered in ED Medications  HYDROmorphone  (DILAUDID) injection 1 mg (has no administration in time range)  ondansetron (ZOFRAN) injection 4 mg (has no administration in time range)  sodium chloride 0.9 % bolus 1,000 mL (has no administration in time range)  alum & mag hydroxide-simeth (MAALOX/MYLANTA) 200-200-20 MG/5ML suspension 30 mL (has no administration in time range)    ED Course/ Medical Decision Making/ A&P                                 Medical Decision Making Amount and/or Complexity of Data Reviewed Labs: ordered. Radiology: ordered.  Risk OTC drugs. Prescription drug management. Decision regarding hospitalization.   This patient presents to the ED for concern of epigastric Bryan Edwards, nausea, this involves an extensive number of treatment options, and is a complaint that carries with it a high risk of complications and morbidity.  The differential diagnosis includes acute appendicitis, cholecystitis, bowel torsion, diverticulitis, pyelonephritis, kidney stone, pancreatitis, mesenteric ischemia   Co morbidities that complicate the patient evaluation  Previous pancreatitis   Additional history obtained:  Additional history obtained from medical records External records from outside source obtained and reviewed including none   Lab Tests:  I Ordered, and personally interpreted labs.  The pertinent results include: Elevated lipase, leukocytosis, acidosis   Imaging Studies ordered:  I ordered imaging studies including CT scan of the abdomen and pelvis I independently visualized and interpreted imaging which showed acute pancreatic inflammation, no fluid collection I agree with the radiologist interpretation   Cardiac Monitoring: / EKG:  The patient was maintained on a cardiac monitor.  I personally viewed and interpreted the cardiac monitored which showed an underlying rhythm of: Sinus tachycardia, no ischemic changes, no STEMI   Consultations Obtained:  I requested consultation with the  hospitalist,  and discussed lab and imaging findings as well as pertinent plan - they recommend: Admission   Problem List / ED Course / Critical interventions / Medication management  Patient is doing well at this time and does remain stable.  Discussed with patient that we will plan for admission to the hospitalist service for his pancreatitis.  Edwards and vital signs have improved with treatment in the emergency department.  He has been given IV fluids as well as IV Edwards medications with improvement in his symptoms.  CT scan of the abdomen and pelvis demonstrated no other acute surgical pathology at this point.  Patient case has been discussed with Dr. Thomes Dinning who has excepted for admission at this  time.  I did touch base with Dr. Tasia Catchings with general surgery regarding the small bowel-small bowel intussusception.  He recommended that general surgery evaluate the patient in the morning and he notes that his group also evaluate the patient in the morning.  He did not recommend emergent surgical consultation at this time.  This was passed on to Dr. Thomes Dinning as well.  I ordered medication including IV fluids, Edwards medications for pancreatitis Reevaluation of the patient after these medicines showed that the patient improved I have reviewed the patients home medicines and have made adjustments as needed   Social Determinants of Health:  None   Test / Admission - Considered:  Admission        Final Clinical Impression(s) / ED Diagnoses Final diagnoses:  None    Rx / DC Orders ED Discharge Orders     None         Lelon Perla, PA-C 04/16/23 2323    Coral Spikes, DO 04/17/23 0004

## 2023-04-16 NOTE — ED Triage Notes (Signed)
 Pt arrived via POV c/o abdominal pain and nausea. Pt reports pain worse after eating. Pt reports previous admission to the hospital for pancreatitis and reports drinking ETOH last night.

## 2023-04-16 NOTE — ED Notes (Signed)
 Waiting for results from ct scan, said he is still hurting as well.

## 2023-04-17 ENCOUNTER — Inpatient Hospital Stay (HOSPITAL_COMMUNITY): Payer: Medicaid Other

## 2023-04-17 DIAGNOSIS — R188 Other ascites: Secondary | ICD-10-CM | POA: Diagnosis not present

## 2023-04-17 DIAGNOSIS — K561 Intussusception: Secondary | ICD-10-CM | POA: Diagnosis not present

## 2023-04-17 DIAGNOSIS — K852 Alcohol induced acute pancreatitis without necrosis or infection: Secondary | ICD-10-CM | POA: Diagnosis not present

## 2023-04-17 DIAGNOSIS — F1010 Alcohol abuse, uncomplicated: Secondary | ICD-10-CM

## 2023-04-17 DIAGNOSIS — Z86718 Personal history of other venous thrombosis and embolism: Secondary | ICD-10-CM

## 2023-04-17 DIAGNOSIS — K8581 Other acute pancreatitis with uninfected necrosis: Secondary | ICD-10-CM | POA: Diagnosis not present

## 2023-04-17 DIAGNOSIS — K760 Fatty (change of) liver, not elsewhere classified: Secondary | ICD-10-CM

## 2023-04-17 DIAGNOSIS — K828 Other specified diseases of gallbladder: Secondary | ICD-10-CM

## 2023-04-17 LAB — COMPREHENSIVE METABOLIC PANEL
ALT: 15 U/L (ref 0–44)
AST: 20 U/L (ref 15–41)
Albumin: 3.9 g/dL (ref 3.5–5.0)
Alkaline Phosphatase: 66 U/L (ref 38–126)
Anion gap: 6 (ref 5–15)
BUN: 9 mg/dL (ref 6–20)
CO2: 29 mmol/L (ref 22–32)
Calcium: 8.9 mg/dL (ref 8.9–10.3)
Chloride: 99 mmol/L (ref 98–111)
Creatinine, Ser: 0.79 mg/dL (ref 0.61–1.24)
GFR, Estimated: >60 mL/min (ref >60)
Glucose, Bld: 98 mg/dL (ref 70–99)
Potassium: 4.6 mmol/L (ref 3.5–5.1)
Sodium: 134 mmol/L — ABNORMAL LOW (ref 135–145)
Total Bilirubin: 1.4 mg/dL — ABNORMAL HIGH (ref 0.0–1.2)
Total Protein: 6.3 g/dL — ABNORMAL LOW (ref 6.5–8.1)

## 2023-04-17 LAB — CBC
HCT: 48.3 % (ref 39.0–52.0)
Hemoglobin: 16.5 g/dL (ref 13.0–17.0)
MCH: 30.2 pg (ref 26.0–34.0)
MCHC: 34.2 g/dL (ref 30.0–36.0)
MCV: 88.3 fL (ref 80.0–100.0)
Platelets: 175 10*3/uL (ref 150–400)
RBC: 5.47 MIL/uL (ref 4.22–5.81)
RDW: 12.7 % (ref 11.5–15.5)
WBC: 11 10*3/uL — ABNORMAL HIGH (ref 4.0–10.5)
nRBC: 0 % (ref 0.0–0.2)

## 2023-04-17 LAB — MAGNESIUM: Magnesium: 1.8 mg/dL (ref 1.7–2.4)

## 2023-04-17 MED ORDER — HYDROMORPHONE HCL 1 MG/ML IJ SOLN
0.5000 mg | INTRAMUSCULAR | Status: DC | PRN
  Administered 2023-04-17 – 2023-04-18 (×9): 0.5 mg via INTRAVENOUS
  Filled 2023-04-17 (×9): qty 0.5

## 2023-04-17 MED ORDER — LORAZEPAM 0.5 MG PO TABS
0.5000 mg | ORAL_TABLET | Freq: Four times a day (QID) | ORAL | Status: DC | PRN
  Administered 2023-04-17 – 2023-04-19 (×6): 0.5 mg via ORAL
  Filled 2023-04-17 (×6): qty 1

## 2023-04-17 MED ORDER — STERILE WATER FOR INJECTION IJ SOLN
INTRAMUSCULAR | Status: AC
  Administered 2023-04-17: 10 mL
  Filled 2023-04-17: qty 10

## 2023-04-17 MED ORDER — PANTOPRAZOLE SODIUM 40 MG IV SOLR
40.0000 mg | INTRAVENOUS | Status: DC
  Administered 2023-04-17 – 2023-04-18 (×2): 40 mg via INTRAVENOUS
  Filled 2023-04-17 (×2): qty 10

## 2023-04-17 MED ORDER — DOCUSATE SODIUM 100 MG PO CAPS
100.0000 mg | ORAL_CAPSULE | Freq: Every day | ORAL | Status: DC | PRN
Start: 2023-04-17 — End: 2023-04-19
  Administered 2023-04-17 – 2023-04-18 (×2): 100 mg via ORAL
  Filled 2023-04-17 (×2): qty 1

## 2023-04-17 MED ORDER — ONDANSETRON HCL 4 MG/2ML IJ SOLN: 4.0000 mg | Freq: Four times a day (QID) | INTRAMUSCULAR | Status: DC | PRN

## 2023-04-17 MED ORDER — ACETAMINOPHEN 325 MG PO TABS: 650.0000 mg | ORAL_TABLET | Freq: Four times a day (QID) | ORAL | Status: DC | PRN

## 2023-04-17 MED ORDER — ONDANSETRON HCL 4 MG PO TABS: 4.0000 mg | ORAL_TABLET | Freq: Four times a day (QID) | ORAL | Status: DC | PRN

## 2023-04-17 MED ORDER — ACETAMINOPHEN 650 MG RE SUPP: 650.0000 mg | Freq: Four times a day (QID) | RECTAL | Status: DC | PRN

## 2023-04-17 MED ORDER — HYDRALAZINE HCL 20 MG/ML IJ SOLN: 10.0000 mg | INTRAMUSCULAR | Status: DC | PRN

## 2023-04-17 NOTE — Progress Notes (Signed)
   04/17/23 1119  TOC Brief Assessment  Insurance and Status Reviewed  Patient has primary care physician No (PCP list added)  Home environment has been reviewed Home  Prior level of function: Independent  Prior/Current Home Services No current home services  Social Drivers of Health Review SDOH reviewed no interventions necessary  Readmission risk has been reviewed Yes  Transition of care needs no transition of care needs at this time   Transition of Care Department [facility]) has reviewed patient and no TOC needs have been identified at this time. We will continue to monitor patient advancement through interdisciplinary progression rounds. If new patient transition needs arise, please place a TOC consult.

## 2023-04-17 NOTE — Plan of Care (Signed)
  Problem: Education: Goal: Knowledge of General Education information will improve Description: Including pain rating scale, medication(s)/side effects and non-pharmacologic comfort measures Outcome: Progressing   Problem: Nutrition: Goal: Adequate nutrition will be maintained Outcome: Progressing   Problem: Activity: Goal: Risk for activity intolerance will decrease Outcome: Progressing   

## 2023-04-17 NOTE — Progress Notes (Addendum)
 Bryan Edwards is a 28 y.o. male with medical history significant of heavy alcohol abuse, tobacco abuse who presents to the emergency department due to epigastric abdominal pain which started yesterday in the morning.  He was admitted with acute pancreatitis likely in the setting of alcohol abuse with recent consumption.  He was also noted to have intussusception noted on CT abdomen and pelvis, but no active obstruction.  GI following.  Patient seen and evaluated at bedside and has been admitted after midnight.  Plan to continue aggressive IV fluids and advance diet as tolerated.  Started on clear liquids today.  Check a.m. labs.  Total care time: 30 minutes.

## 2023-04-17 NOTE — Discharge Instructions (Signed)

## 2023-04-17 NOTE — Consult Note (Addendum)
 @LOGO @   Referring Provider: Lelon Perla, PA-C  Primary Care Physician:  Pcp, No Primary Gastroenterologist:  Dr. Jena Gauss  Date of Admission: 04/16/23 Date of Consultation: 04/17/23  Reason for Consultation:  Acute pancreatitis  HPI:  Bryan Edwards is a 28 y.o. 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  ER evaluation: Hypertensive with BP 164/115, tachycardic with heart rate 114, otherwise vital signs within normal limits.  Labs remarkable for WBC 19.9, hemoglobin 18.6, hematocrit 52.3, lipase 710.  CT A/P with contrast showed acute uncomplicated pancreatitis with moderate parenchymal edema and extensive peripancreatic fat stranding.  Small volume ascites throughout the upper abdomen.  Hepatic steatosis.  Incidental likely transient small bowel small bowel intussusception within the left mid abdomen.  No bowel obstruction.  Patient was admitted to the hospital service, started on IV fluids at 125cc/hr, analgesics, pantoprazole, abdominal ultrasound ordered, GI consulted for assistance with management.  Labs today with WBC improved to 11.0, hemoglobin 16.5, hematocrit 48.3.  Total bilirubin slightly elevated at 1.4.  Consult:  Reports intermittent episodes of epigastric pain that have been mild and resolved on their own since his last hospital discharge.  However, yesterday, he developed epigastric pain that was constant and progressively worsening, so he presented to the emergency room.  He had associated nausea, but no vomiting.  Reports pain was 10 out of 10.  He has received pain medications and notes improvement in his abdominal pain right now and is currently feeling okay.  No current nausea. Would like to have clear liquids today.    Denies any other GI concern such as heartburn, dysphagia, BRBPR, melena, bowel issues.  NSAIDs: None.   ETOH: Reports no alcohol for couple of months after last hospital discharge, but then resumed consumption of alcohol though to a lesser degree.  Would drink a few beers per day or a few shots of whiskey.  Sometimes, would mix them.  However, he had a friend come into town Wednesday night and he drank 8-10 shots along with 6 beer.   Regarding history of splenic vein thrombus, patient reports he did take Eliquis for about 2 months, then stopped.  Reports that it made him feel cold and dizzy.   Past Medical History:  Diagnosis Date   Anxiety    Heart murmur    Pancreatitis     History reviewed. No pertinent surgical history.  Prior to Admission medications   Medication Sig Start Date End Date Taking? Authorizing Provider  Multiple Vitamin (MULTIVITAMIN) tablet Take 1 tablet by mouth daily.   Yes [provider]    Current Facility-Administered Medications  Medication Dose Route Frequency Provider Last Rate Last Admin   acetaminophen (TYLENOL) tablet 650 mg  650 mg Oral Q6H PRN Adefeso, Oladapo, DO       Or   acetaminophen (TYLENOL) suppository 650 mg  650 mg Rectal Q6H PRN Adefeso, Oladapo, DO       hydrALAZINE (APRESOLINE) injection 10 mg  10 mg Intravenous Q4H PRN Sherryll Burger, Pratik D, DO       HYDROmorphone (DILAUDID) injection 0.5 mg  0.5 mg Intravenous Q3H PRN Adefeso, Oladapo, DO   0.5 mg at 04/17/23 1610   lactated ringers infusion   Intravenous Continuous Lelon Perla, PA-C 125 mL/hr at 04/17/23 0402 New Bag at  04/17/23 0402   ondansetron (ZOFRAN) tablet 4 mg  4 mg Oral Q6H PRN Adefeso, Oladapo, DO       Or   ondansetron (ZOFRAN) injection 4 mg  4 mg Intravenous Q6H PRN Adefeso, Oladapo, DO       pantoprazole (PROTONIX) injection 40 mg  40 mg Intravenous Q24H Adefeso, Oladapo, DO   40 mg at 04/17/23 0607   Current Outpatient Medications  Medication Sig  Dispense Refill   Multiple Vitamin (MULTIVITAMIN) tablet Take 1 tablet by mouth daily.      Allergies as of 04/16/2023 - Review Complete 04/16/2023  Allergen Reaction Noted   Amoxicillin Other (See Comments) 10/25/2011   Bee pollen Other (See Comments) 01/09/2023   Penicillins Swelling 01/09/2023    History reviewed. No pertinent family history.  Social History   Socioeconomic History   Marital status: Single    Spouse name: Not on file   Number of children: Not on file   Years of education: Not on file   Highest education level: Not on file  Occupational History   Not on file  Tobacco Use   Smoking status: Every Day    Current packs/day: 1.00    Types: Cigarettes   Smokeless tobacco: Not on file  Vaping Use   Vaping status: Never Used  Substance and Sexual Activity   Alcohol use: Yes    Comment: few times weekly   Drug use: No   Sexual activity: Not on file  Other Topics Concern   Not on file  Social History Narrative   Not on file   Social Drivers of Health   Financial Resource Strain: Not on file  Food Insecurity: Unknown (01/09/2023)   Hunger Vital Sign    Worried About Running Out of Food in the Last Year: Not on file    Ran Out of Food in the Last Year: Never true  Transportation Needs: No Transportation Needs (01/09/2023)   PRAPARE - Administrator, Civil Service (Medical): No    Lack of Transportation (Non-Medical): No  Physical Activity: Not on file  Stress: Not on file  Social Connections: Not on file  Intimate Partner Violence: Not At Risk (01/09/2023)   Humiliation, Afraid, Rape, and Kick questionnaire    Fear of Current or Ex-Partner: No    Emotionally Abused: No    Physically Abused: No    Sexually Abused: No    Review of Systems: Gen: Denies fever, chills, cold or flulike symptoms, presyncope, syncope. CV: Denies chest pain related to anxiety. Resp: Denies shortness of breath, cough. GI: See HPI GU : Denies urinary burning,  urinary frequency, urinary incontinence.  MS: Denies joint pain. Derm: Denies rash. Psych: Admits to anxiety. Heme: See HPI  Physical Exam: Vital signs in last 24 hours: Temp:  [98.2 F (36.8 C)-98.4 F (36.9 C)] 98.2 F (36.8 C) (02/28 0400) Pulse Rate:  [54-114] 54 (02/28 0600) Resp:  [16-18] 18 (02/28 0400) BP: (133-164)/(91-115) 136/98 (02/28 0600) SpO2:  [89 %-99 %] 94 % (02/28 0600) Weight:  [64.9 kg] 64.9 kg (02/27 1639) Last BM Date : 04/16/23 General:   Alert,  Well-developed, well-nourished, pleasant and appears very comfortable laying in bed. Head:  Normocephalic and atraumatic. Eyes:  Sclera clear, no icterus.   Conjunctiva pink. Ears:  Normal auditory acuity. Lungs:  Clear throughout to auscultation.   No wheezes, crackles, or rhonchi. No acute distress. Heart:  Regular rate and rhythm; no murmurs, clicks, rubs,  or gallops. Abdomen:  Soft,  and nondistended. Mild generalized TTP, greatest in epigastric area. No masses, hepatosplenomegaly or hernias noted. Normal bowel sounds, without guarding, and without rebound.   Rectal:  Deferred  Msk:  Symmetrical without gross deformities. Normal posture. Pulses:  Normal pulses noted. Extremities:  Without edema. Neurologic:  Alert and  oriented x4;  grossly normal neurologically. Skin:  Intact without significant lesions or rashes. Psych:  Normal mood and affect.  Intake/Output from previous day: No intake/output data recorded. Intake/Output this shift: No intake/output data recorded.  Lab Results: Recent Labs    04/16/23 1730 04/17/23 0605  WBC 19.9* 11.0*  HGB 18.6* 16.5  HCT 52.3* 48.3  PLT 292 175   BMET Recent Labs    04/16/23 1730 04/17/23 0605  NA 135 134*  K 4.0 4.6  CL 96* 99  CO2 20* 29  GLUCOSE 96 98  BUN 11 9  CREATININE 0.67 0.79  CALCIUM 9.8 8.9   LFT Recent Labs    04/16/23 1730 04/17/23 0605  PROT 7.8 6.3*  ALBUMIN 5.0 3.9  AST 27 20  ALT 20 15  ALKPHOS 83 66  BILITOT 1.1 1.4*     Studies/Results: CT ABDOMEN PELVIS W CONTRAST Result Date: 04/16/2023 CLINICAL DATA:  Epigastric pain, nausea, history of pancreatitis EXAM: CT ABDOMEN AND PELVIS WITH CONTRAST TECHNIQUE: Multidetector CT imaging of the abdomen and pelvis was performed using the standard protocol following bolus administration of intravenous contrast. RADIATION DOSE REDUCTION: This exam was performed according to the departmental dose-optimization program which includes automated exposure control, adjustment of the mA and/or kV according to patient size and/or use of iterative reconstruction technique. CONTRAST:  OMNIPAQUE IOHEXOL 300 MG/ML  SOLN COMPARISON:  01/09/2023 FINDINGS: Lower chest: No acute pleural or parenchymal lung disease. Hepatobiliary: Hepatic steatosis. No focal liver abnormality is seen. No gallstones, gallbladder wall thickening, or biliary dilatation. Pancreas: Pancreas is heterogeneous with prominent edema within the pancreatic head and uncinate process. Marked peripancreatic fat stranding compatible with acute pancreatitis. No localized fluid collection, abscess, or pseudocyst. Spleen: Normal in size without focal abnormality. Adrenals/Urinary Tract: Adrenal glands are unremarkable. Kidneys are normal, without renal calculi, focal lesion, or hydronephrosis. Bladder is unremarkable. Stomach/Bowel: No bowel obstruction or ileus. Normal retrocecal appendix. There is a likely transient short segment small bowel-small bowel intussusception within the left mid abdomen, extending approximately 3 cm in length. This is best seen on image 61/4 and image 38/2. Vascular/Lymphatic: No significant vascular findings are present. No enlarged abdominal or pelvic lymph nodes. Reproductive: Prostate is unremarkable. Other: Small volume ascites most pronounced within the central upper abdomen and right mid abdomen. No free intraperitoneal gas. No abdominal wall hernia. Musculoskeletal: No acute or destructive bony  abnormalities. Reconstructed images demonstrate no additional findings. IMPRESSION: 1. Acute uncomplicated pancreatitis, with moderate parenchymal edema and extensive peripancreatic fat stranding. No fluid collection, pseudocyst, or abscess. 2. Small volume ascites throughout the upper abdomen. 3. Hepatic steatosis. 4. Incidental likely transient small bowel-small bowel intussusception within the left mid abdomen. No bowel obstruction. Electronically Signed   By: Sharlet Salina M.D.   On: 04/16/2023 21:05    Impression: 28 y.o. 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  Acute, uncomplicated pancreatitis: Likely alcoholic in etiology.  On admission, WBC 19.9, hemoglobin 18.6, hematocrit 52.3, hypertensive and tachycardic. Currently on IV fluids, pantoprazole daily, Dilaudid as needed.  Labs have improved quite a bit with WBC 11.0, hemoglobin 16.5, hematocrit 48.3 which is encouraging. Tachycardia also resolved. Patient currently feeling somewhat improved since admission with pain medications on board.  He is requesting to try clear liquids today which would be ok. Advised if he has worsening abdominal pain with clear liquids, hold off on further consumption and we can reevaluate tomorrow.  Notably, patient had reduced the amount of alcohol he was consuming after his prior episode of pancreatitis, but continued to drink alcohol on a daily basis.  Discussed this extensively with him today and recommended complete alcohol avoidance.   History of small, nonocclusive splenic vein thrombus: Noted on CT in November 2024 at the time of acute pancreatitis.  Completed about  21-month course of Eliquis.  CT this admission with no evidence of splenic vein thrombus.   Plan: Trial of clear liquid diet today Continue IV fluids today.  If tolerating clears well, should be able to discontinue tomorrow. Continue analgesics, antiemetics as needed. Continue pantoprazole 40 mg daily. Counseled on complete alcohol avoidance. Follow-up on RUQ [place] as ordered by hospitalist.    LOS: 1 day    04/17/2023, 8:37 AM   Ermalinda Memos, PA-C [facility]

## 2023-04-17 NOTE — H&P (Signed)
 History and Physical    Patient: Bryan Edwards ZOX:[id number] DOB: [birth year] DOA: 04/16/2023 DOS: the patient was seen and examined on 04/17/2023 PCP: Pcp, No  Patient coming from: Home  Chief Complaint:  Chief Complaint  Patient presents with   Abdominal Pain   HPI: Bryan Edwards is a 28 y.o. male with medical history significant of heavy alcohol abuse, tobacco abuse who presents to the emergency department due to epigastric abdominal pain which started yesterday in the morning.  Pain was constant and was similar to prior episode of pancreatitis.  Patient states that he has abstained from alcohol consumption, however, he had a lot  of liquor and beer when a friend visited him a day prior to presenting to the ED.  He endorsed having nausea without vomiting.  ED Course:  In the emergency department, BP was 135/105, other vital signs are within normal range.  Workup in the ED showed WBC 19.9, hemoglobin 18.16, hematocrit 52.3, platelets 292.  BMP was normal except for chloride of 96, bicarb 20, anion gap 19, lipase 710, urinalysis was unimpressive for UTI. CT abdomen and pelvis showed acute uncomplicated pancreatitis with moderate parenchymal edema and extensive peripancreatic fat stranding.  No fluid collection, pseudocyst or abscess. Incidental likely transient small bowel- small bowel intussusception within the left mid abdomen.  No bowel obstruction. Patient was treated with Dilaudid, Ativan was given due to anxiety, Zofran was given and patient was provided with IV hydration. Gastroenterologist (Dr. Tasia Catchings) was consulted and will follow-up with patient in the morning.  Hospitalist was asked to admit patient for further evaluation and management.  Review of Systems: Review of systems as noted in the HPI. All other systems reviewed and are negative.   Past Medical History:  Diagnosis Date   Anxiety    Heart murmur    Pancreatitis    History reviewed. No pertinent surgical  history.  Social History:  reports that he has been smoking cigarettes. He does not have any smokeless tobacco history on file. He reports current alcohol use. He reports that he does not use drugs.   Allergies  Allergen Reactions   Amoxicillin Other (See Comments)    Makes tongue and throat hurt, dries it out   Bee Pollen Other (See Comments)    Seasonal allergies   Penicillins Swelling    History reviewed. No pertinent family history.    Prior to Admission medications   Medication Sig Start Date End Date Taking? Authorizing Provider  apixaban (ELIQUIS) 5 MG TABS tablet Take 2 tablets (10 mg total) by mouth 2 (two) times daily for 6 days, THEN 1 tablet (5 mg total) 2 (two) times daily. 01/15/23 04/21/23  Catarina Hartshorn, MD  chlordiazePOXIDE (LIBRIUM) 10 MG capsule Take 1 capsule (10 mg total) by mouth 2 (two) times daily. X 2 days, then once daily starting 01/17/23 01/15/23   Tat, Onalee Hua, MD  folic acid (FOLVITE) 1 MG tablet Take 1 tablet (1 mg total) by mouth daily. 01/16/23   Catarina Hartshorn, MD  metoprolol tartrate (LOPRESSOR) 25 MG tablet Take 0.5 tablets (12.5 mg total) by mouth 2 (two) times daily. 01/15/23   Catarina Hartshorn, MD  Multiple Vitamin (MULTIVITAMIN) tablet Take 1 tablet by mouth daily.    [provider]  oxyCODONE (OXY IR/ROXICODONE) 5 MG immediate release tablet Take 1 tablet (5 mg total) by mouth every 6 (six) hours as needed for moderate pain (pain score 4-6). 01/15/23   Catarina Hartshorn, MD    Physical Exam: BP (!) 164/106  Pulse (!) 58   Temp 98.2 F (36.8 C)   Resp 18   Ht 5\' 10"  (1.778 m)   Wt 64.9 kg   SpO2 97%   BMI 20.52 kg/m   General: 28 y.o. year-old male well developed well nourished in no acute distress.  Alert and oriented x3. HEENT: NCAT, EOMI Neck: Supple, trachea medial Cardiovascular: Regular rate and rhythm with no rubs or gallops.  No thyromegaly or JVD noted.  No lower extremity edema. 2/4 pulses in all 4 extremities. Respiratory: Clear to  auscultation with no wheezes or rales. Good inspiratory effort. Abdomen: Soft, tender to palpation of epigastric area without guarding.  Nondistended with normal bowel sounds x4 quadrants. Muskuloskeletal: No cyanosis, clubbing or edema noted bilaterally Neuro: CN II-XII intact, strength 5/5 x 4, sensation, reflexes intact Skin: No ulcerative lesions noted or rashes Psychiatry: Judgement and insight appear normal. Mood is appropriate for condition and setting          Labs on Admission:  Basic Metabolic Panel: Recent Labs  Lab 04/16/23 1730  NA 135  K 4.0  CL 96*  CO2 20*  GLUCOSE 96  BUN 11  CREATININE 0.67  CALCIUM 9.8   Liver Function Tests: Recent Labs  Lab 04/16/23 1730  AST 27  ALT 20  ALKPHOS 83  BILITOT 1.1  PROT 7.8  ALBUMIN 5.0   Recent Labs  Lab 04/16/23 1730  LIPASE 710*   No results for input(s): "AMMONIA" in the last 168 hours. CBC: Recent Labs  Lab 04/16/23 1730  WBC 19.9*  NEUTROABS 15.8*  HGB 18.6*  HCT 52.3*  MCV 85.3  PLT 292   Cardiac Enzymes: No results for input(s): "CKTOTAL", "CKMB", "CKMBINDEX", "TROPONINI" in the last 168 hours.  BNP (last 3 results) No results for input(s): "BNP" in the last 8760 hours.  ProBNP (last 3 results) No results for input(s): "PROBNP" in the last 8760 hours.  CBG: No results for input(s): "GLUCAP" in the last 168 hours.  Radiological Exams on Admission: CT ABDOMEN PELVIS W CONTRAST Result Date: 04/16/2023 CLINICAL DATA:  Epigastric pain, nausea, history of pancreatitis EXAM: CT ABDOMEN AND PELVIS WITH CONTRAST TECHNIQUE: Multidetector CT imaging of the abdomen and pelvis was performed using the standard protocol following bolus administration of intravenous contrast. RADIATION DOSE REDUCTION: This exam was performed according to the departmental dose-optimization program which includes automated exposure control, adjustment of the mA and/or kV according to patient size and/or use of iterative  reconstruction technique. CONTRAST:  OMNIPAQUE IOHEXOL 300 MG/ML  SOLN COMPARISON:  01/09/2023 FINDINGS: Lower chest: No acute pleural or parenchymal lung disease. Hepatobiliary: Hepatic steatosis. No focal liver abnormality is seen. No gallstones, gallbladder wall thickening, or biliary dilatation. Pancreas: Pancreas is heterogeneous with prominent edema within the pancreatic head and uncinate process. Marked peripancreatic fat stranding compatible with acute pancreatitis. No localized fluid collection, abscess, or pseudocyst. Spleen: Normal in size without focal abnormality. Adrenals/Urinary Tract: Adrenal glands are unremarkable. Kidneys are normal, without renal calculi, focal lesion, or hydronephrosis. Bladder is unremarkable. Stomach/Bowel: No bowel obstruction or ileus. Normal retrocecal appendix. There is a likely transient short segment small bowel-small bowel intussusception within the left mid abdomen, extending approximately 3 cm in length. This is best seen on image 61/4 and image 38/2. Vascular/Lymphatic: No significant vascular findings are present. No enlarged abdominal or pelvic lymph nodes. Reproductive: Prostate is unremarkable. Other: Small volume ascites most pronounced within the central upper abdomen and right mid abdomen. No free intraperitoneal gas. No abdominal  wall hernia. Musculoskeletal: No acute or destructive bony abnormalities. Reconstructed images demonstrate no additional findings. IMPRESSION: 1. Acute uncomplicated pancreatitis, with moderate parenchymal edema and extensive peripancreatic fat stranding. No fluid collection, pseudocyst, or abscess. 2. Small volume ascites throughout the upper abdomen. 3. Hepatic steatosis. 4. Incidental likely transient small bowel-small bowel intussusception within the left mid abdomen. No bowel obstruction. Electronically Signed   By: Sharlet Salina M.D.   On: 04/16/2023 21:05    EKG: I independently viewed the EKG done and my findings  are as followed: Sinus tachycardia at a rate of 106 bpm with occasional PVCs  Assessment/Plan Present on Admission:  Acute pancreatitis  Principal Problem:   Acute pancreatitis Active Problems:   Intussusception (HCC)   Alcohol abuse  Acute pancreatitis Continue IV Zofran p.r.n Continue IV Dilaudid 0.5 mg every 3 hours p.r.n for pain Continue Protonix Continue IV LR at 168ml/Hr RUQ U/S in the morning to investigate biliary etiology (gallstone and bile duct dilatation  Intussusception CT abdomen pelvis showed Incidental likely transient small bowel- small bowel intussusception within the left mid abdomen.  No bowel obstruction. Gastroenterologist (Dr. Tasia Catchings) was consulted and will follow-up with patient in the morning General surgery will be consulted and we shall await further recommendations  Alcohol abuse Patient was counseled on alcohol abuse cessation No alcohol withdrawal sign at this time, continue to monitor for withdrawal and consider starting patient on CIWA protocol at that time.  DVT prophylaxis: SCDs  Code Status: Full code   Family Communication: Mother at bedside (all questions answered to satisfaction)  Consults: Gastroenterology, general surgery  Severity of Illness: The appropriate patient status for this patient is INPATIENT. Inpatient status is judged to be reasonable and necessary in order to provide the required intensity of service to ensure the patient's safety. The patient's presenting symptoms, physical exam findings, and initial radiographic and laboratory data in the context of their chronic comorbidities is felt to place them at high risk for further clinical deterioration. Furthermore, it is not anticipated that the patient will be medically stable for discharge from the hospital within 2 midnights of admission.   * I certify that at the point of admission it is my clinical judgment that the patient will require inpatient hospital care spanning  beyond 2 midnights from the point of admission due to high intensity of service, high risk for further deterioration and high frequency of surveillance required.*  Author: Frankey Shown, DO 04/17/2023 5:50 AM  For on call review [email or web address].

## 2023-04-17 NOTE — ED Notes (Signed)
 ..ED TO INPATIENT HANDOFF REPORT  ED Nurse Name and Phone #: [phone]  S Name/Age/Gender Bryan Edwards 28 y.o. male Room/Bed: APA05/APA05  Code Status   Code Status: Prior  Home/SNF/Other Home Patient oriented to: self, place, time, and situation Is this baseline? Yes   Triage Complete: Triage complete  Chief Complaint Acute pancreatitis [K85.90]  Triage Note Pt arrived via POV c/o abdominal pain and nausea. Pt reports pain worse after eating. Pt reports previous admission to the hospital for pancreatitis and reports drinking ETOH last night.    Allergies Allergies  Allergen Reactions   Amoxicillin Other (See Comments)    Makes tongue and throat hurt, dries it out   Bee Pollen Other (See Comments)    Seasonal allergies   Penicillins Swelling    Level of Care/Admitting Diagnosis ED Disposition     ED Disposition  Admit   Condition  --   Comment  Hospital Area: [facility] [[id number]]  Level of Care: Med-Surg [16]  Covid Evaluation: Asymptomatic - no recent exposure (last 10 days) testing not required  Diagnosis: Acute pancreatitis [577.0.ICD-9-CM]  Admitting Physician: Frankey Shown [[id number]]  Attending Physician: Frankey Shown [[id number]]  Certification:: I certify this patient will need inpatient services for at least 2 midnights  Expected Medical Readiness: 04/19/2023          B Medical/Surgery History Past Medical History:  Diagnosis Date   Anxiety    Heart murmur    Pancreatitis    History reviewed. No pertinent surgical history.   A IV Location/Drains/Wounds Patient Lines/Drains/Airways Status     Active Line/Drains/Airways     Name Placement date Placement time Site Days   Peripheral IV 04/16/23 20 G 1" Anterior;Right Forearm 04/16/23  1722  Forearm  1            Intake/Output Last 24 hours No intake or output data in the 24 hours ending 04/17/23 0114  Labs/Imaging Results for orders placed or performed during the  hospital encounter of 04/16/23 (from the past 48 hours)  Lipase, blood     Status: Abnormal   Collection Time: 04/16/23  5:30 PM  Result Value Ref Range   Lipase 710 (H) 11 - 51 U/L    Comment: RESULT CALLED TO, READ BACK BY AND VERIFIED WITH: Caria Transue,L ON 04/16/23 AT 1905 BY LOY,C Performed at [facility], [address], [place], [place] [address] CORRECTED ON 02/27 AT 1911: PREVIOUSLY REPORTED AS 735 RESULTS CONFIRMED BY MANUAL DILUTION   Comprehensive metabolic panel     Status: Abnormal   Collection Time: 04/16/23  5:30 PM  Result Value Ref Range   Sodium 135 135 - 145 mmol/L   Potassium 4.0 3.5 - 5.1 mmol/L   Chloride 96 (L) 98 - 111 mmol/L   CO2 20 (L) 22 - 32 mmol/L   Glucose, Bld 96 70 - 99 mg/dL    Comment: Glucose reference range applies only to samples taken after fasting for at least 8 hours.   BUN 11 6 - 20 mg/dL   Creatinine, Ser 2.84 0.61 - 1.24 mg/dL   Calcium 9.8 8.9 - 13.2 mg/dL   Total Protein 7.8 6.5 - 8.1 g/dL   Albumin 5.0 3.5 - 5.0 g/dL   AST 27 15 - 41 U/L   ALT 20 0 - 44 U/L   Alkaline Phosphatase 83 38 - 126 U/L   Total Bilirubin 1.1 0.0 - 1.2 mg/dL   GFR, Estimated >44 >01 mL/min    Comment: (NOTE) Calculated  using the CKD-EPI Creatinine Equation (2021)    Anion gap 19 (H) 5 - 15    Comment: Performed at [facility], [address] [place], [place] [address]  Urinalysis, Routine w reflex microscopic -Urine, Clean Catch     Status: Abnormal   Collection Time: 04/16/23  5:30 PM  Result Value Ref Range   Color, Urine YELLOW YELLOW   APPearance HAZY (A) CLEAR   Specific Gravity, Urine 1.014 1.005 - 1.030   pH 5.0 5.0 - 8.0   Glucose, UA NEGATIVE NEGATIVE mg/dL   Hgb urine dipstick SMALL (A) NEGATIVE   Bilirubin Urine NEGATIVE NEGATIVE   Ketones, ur 5 (A) NEGATIVE mg/dL   Protein, ur 30 (A) NEGATIVE mg/dL   Nitrite NEGATIVE NEGATIVE   Leukocytes,Ua NEGATIVE NEGATIVE   RBC / HPF 0-5 0 - 5 RBC/hpf   WBC, UA 6-10 0 - 5 WBC/hpf   Bacteria, UA RARE  (A) NONE SEEN   Squamous Epithelial / HPF 0-5 0 - 5 /HPF   Mucus PRESENT     Comment: Performed at [facility], [address] [place], [place] [address]  CBC with Differential     Status: Abnormal   Collection Time: 04/16/23  5:30 PM  Result Value Ref Range   WBC 19.9 (H) 4.0 - 10.5 K/uL   RBC 6.13 (H) 4.22 - 5.81 MIL/uL   Hemoglobin 18.6 (H) 13.0 - 17.0 g/dL    Comment: REPEATED TO VERIFY   HCT 52.3 (H) 39.0 - 52.0 %   MCV 85.3 80.0 - 100.0 fL   MCH 30.3 26.0 - 34.0 pg   MCHC 35.6 30.0 - 36.0 g/dL   RDW 09.8 11.9 - 14.7 %   Platelets 292 150 - 400 K/uL   nRBC 0.0 0.0 - 0.2 %   Neutrophils Relative % 78 %   Neutro Abs 15.8 (H) 1.7 - 7.7 K/uL   Lymphocytes Relative 11 %   Lymphs Abs 2.2 0.7 - 4.0 K/uL   Monocytes Relative 9 %   Monocytes Absolute 1.7 (H) 0.1 - 1.0 K/uL   Eosinophils Relative 0 %   Eosinophils Absolute 0.0 0.0 - 0.5 K/uL   Basophils Relative 1 %   Basophils Absolute 0.1 0.0 - 0.1 K/uL   Immature Granulocytes 1 %   Abs Immature Granulocytes 0.10 (H) 0.00 - 0.07 K/uL    Comment: Performed at [facility], [address] [place], [place] [address]   CT ABDOMEN PELVIS W CONTRAST Result Date: 04/16/2023 CLINICAL DATA:  Epigastric pain, nausea, history of pancreatitis EXAM: CT ABDOMEN AND PELVIS WITH CONTRAST TECHNIQUE: Multidetector CT imaging of the abdomen and pelvis was performed using the standard protocol following bolus administration of intravenous contrast. RADIATION DOSE REDUCTION: This exam was performed according to the departmental dose-optimization program which includes automated exposure control, adjustment of the mA and/or kV according to patient size and/or use of iterative reconstruction technique. CONTRAST:  OMNIPAQUE IOHEXOL 300 MG/ML  SOLN COMPARISON:  01/09/2023 FINDINGS: Lower chest: No acute pleural or parenchymal lung disease. Hepatobiliary: Hepatic steatosis. No focal liver abnormality is seen. No gallstones, gallbladder wall thickening, or  biliary dilatation. Pancreas: Pancreas is heterogeneous with prominent edema within the pancreatic head and uncinate process. Marked peripancreatic fat stranding compatible with acute pancreatitis. No localized fluid collection, abscess, or pseudocyst. Spleen: Normal in size without focal abnormality. Adrenals/Urinary Tract: Adrenal glands are unremarkable. Kidneys are normal, without renal calculi, focal lesion, or hydronephrosis. Bladder is unremarkable. Stomach/Bowel: No bowel obstruction or ileus. Normal  retrocecal appendix. There is a likely transient short segment small bowel-small bowel intussusception within the left mid abdomen, extending approximately 3 cm in length. This is best seen on image 61/4 and image 38/2. Vascular/Lymphatic: No significant vascular findings are present. No enlarged abdominal or pelvic lymph nodes. Reproductive: Prostate is unremarkable. Other: Small volume ascites most pronounced within the central upper abdomen and right mid abdomen. No free intraperitoneal gas. No abdominal wall hernia. Musculoskeletal: No acute or destructive bony abnormalities. Reconstructed images demonstrate no additional findings. IMPRESSION: 1. Acute uncomplicated pancreatitis, with moderate parenchymal edema and extensive peripancreatic fat stranding. No fluid collection, pseudocyst, or abscess. 2. Small volume ascites throughout the upper abdomen. 3. Hepatic steatosis. 4. Incidental likely transient small bowel-small bowel intussusception within the left mid abdomen. No bowel obstruction. Electronically Signed   By: Sharlet Salina M.D.   On: 04/16/2023 21:05    Pending Labs Unresulted Labs (From admission, onward)    None       Vitals/Pain Today's Vitals   04/16/23 2200 04/16/23 2300 04/16/23 2323 04/17/23 0000  BP: (!) 141/107 (!) 144/103  (!) 137/91  Pulse: 62 64  85  Resp: 18 18  18   Temp:    98.3 F (36.8 C)  TempSrc:      SpO2: 93% 95%  93%  Weight:      Height:      PainSc: 9    10-Worst pain ever     Isolation Precautions No active isolations  Medications Medications  lactated ringers infusion ( Intravenous New Bag/Given 04/16/23 1958)  HYDROmorphone (DILAUDID) injection 1 mg (1 mg Intravenous Given 04/16/23 1727)  ondansetron (ZOFRAN) injection 4 mg (4 mg Intravenous Given 04/16/23 1727)  sodium chloride 0.9 % bolus 1,000 mL (1,000 mLs Intravenous Bolus 04/16/23 1726)  alum & mag hydroxide-simeth (MAALOX/MYLANTA) 200-200-20 MG/5ML suspension 30 mL (30 mLs Oral Given 04/16/23 1729)  iohexol (OMNIPAQUE) 300 MG/ML solution 100 mL (100 mLs Intravenous Contrast Given 04/16/23 1903)  LORazepam (ATIVAN) injection 1 mg (1 mg Intravenous Given 04/16/23 1936)  HYDROmorphone (DILAUDID) injection 0.5 mg (0.5 mg Intravenous Given 04/16/23 2054)    Mobility walks     Focused Assessments gastroenterology   R Recommendations: See Admitting Provider Note  Report given to:   Additional Notes: pt is ambulatory, has a surgical consult in am for possible twisted bowel.

## 2023-04-18 ENCOUNTER — Encounter (HOSPITAL_COMMUNITY): Payer: Self-pay | Admitting: Internal Medicine

## 2023-04-18 DIAGNOSIS — K561 Intussusception: Secondary | ICD-10-CM | POA: Diagnosis not present

## 2023-04-18 DIAGNOSIS — K828 Other specified diseases of gallbladder: Secondary | ICD-10-CM | POA: Diagnosis not present

## 2023-04-18 DIAGNOSIS — Z86718 Personal history of other venous thrombosis and embolism: Secondary | ICD-10-CM | POA: Diagnosis not present

## 2023-04-18 DIAGNOSIS — K852 Alcohol induced acute pancreatitis without necrosis or infection: Secondary | ICD-10-CM | POA: Diagnosis not present

## 2023-04-18 DIAGNOSIS — F1010 Alcohol abuse, uncomplicated: Secondary | ICD-10-CM | POA: Diagnosis not present

## 2023-04-18 DIAGNOSIS — R1013 Epigastric pain: Secondary | ICD-10-CM

## 2023-04-18 LAB — CBC
HCT: 44 % (ref 39.0–52.0)
Hemoglobin: 15.1 g/dL (ref 13.0–17.0)
MCH: 30.9 pg (ref 26.0–34.0)
MCHC: 34.3 g/dL (ref 30.0–36.0)
MCV: 90 fL (ref 80.0–100.0)
Platelets: 136 10*3/uL — ABNORMAL LOW (ref 150–400)
RBC: 4.89 MIL/uL (ref 4.22–5.81)
RDW: 12.8 % (ref 11.5–15.5)
WBC: 6.8 10*3/uL (ref 4.0–10.5)
nRBC: 0 % (ref 0.0–0.2)

## 2023-04-18 LAB — COMPREHENSIVE METABOLIC PANEL
ALT: 12 U/L (ref 0–44)
AST: 18 U/L (ref 15–41)
Albumin: 3.9 g/dL (ref 3.5–5.0)
Alkaline Phosphatase: 61 U/L (ref 38–126)
Anion gap: 11 (ref 5–15)
BUN: 5 mg/dL — ABNORMAL LOW (ref 6–20)
CO2: 28 mmol/L (ref 22–32)
Calcium: 9.1 mg/dL (ref 8.9–10.3)
Chloride: 97 mmol/L — ABNORMAL LOW (ref 98–111)
Creatinine, Ser: 0.61 mg/dL (ref 0.61–1.24)
GFR, Estimated: >60 mL/min (ref >60)
Glucose, Bld: 83 mg/dL (ref 70–99)
Potassium: 3.5 mmol/L (ref 3.5–5.1)
Sodium: 136 mmol/L (ref 135–145)
Total Bilirubin: 1.2 mg/dL (ref 0.0–1.2)
Total Protein: 6.3 g/dL — ABNORMAL LOW (ref 6.5–8.1)

## 2023-04-18 LAB — MAGNESIUM: Magnesium: 2 mg/dL (ref 1.7–2.4)

## 2023-04-18 LAB — PHOSPHORUS: Phosphorus: 2.8 mg/dL (ref 2.5–4.6)

## 2023-04-18 LAB — LIPASE, BLOOD: Lipase: 312 U/L — ABNORMAL HIGH (ref 11–51)

## 2023-04-18 MED ORDER — OXYCODONE HCL 5 MG PO TABS
5.0000 mg | ORAL_TABLET | ORAL | Status: DC | PRN
  Administered 2023-04-18 – 2023-04-19 (×5): 5 mg via ORAL
  Filled 2023-04-18 (×5): qty 1

## 2023-04-18 MED ORDER — NICOTINE 21 MG/24HR TD PT24
21.0000 mg | MEDICATED_PATCH | Freq: Every day | TRANSDERMAL | Status: DC
  Administered 2023-04-18 – 2023-04-19 (×2): 21 mg via TRANSDERMAL
  Filled 2023-04-18 (×2): qty 1

## 2023-04-18 MED ORDER — PANTOPRAZOLE SODIUM 40 MG PO TBEC
40.0000 mg | DELAYED_RELEASE_TABLET | Freq: Two times a day (BID) | ORAL | Status: DC
  Administered 2023-04-18 – 2023-04-19 (×2): 40 mg via ORAL
  Filled 2023-04-18 (×2): qty 1

## 2023-04-18 NOTE — Subjective & Objective (Signed)
 Pt seen and examined. Met with pt and his mother at bedside. Pt admits to binge drinking etoh prior to abd pain starting.  Lipase improved. Wants to try full liquid diet. Understand he cannot go home on IV dilaudid. Agreeable to changing to po pain meds and stopping IV dilaudid.

## 2023-04-18 NOTE — Plan of Care (Signed)

## 2023-04-18 NOTE — Assessment & Plan Note (Signed)
 04-18-2023 adv to full liquid diet today. Stop IV dilaudid. Start po oxycodone q4h prn. Change to po protonix. Advised that he needs to completely abstain from ETOH the rest of his life. Pt's mother in agreement.  04-19-2023 stable for DC. DC to home with 3 days of oxycodone prn. Advised pt he cannot drink ETOH for the rest of his life.

## 2023-04-18 NOTE — Hospital Course (Signed)
 HPI: Bryan Edwards is a 28 y.o. male with medical history significant of heavy alcohol abuse, tobacco abuse who presents to the emergency department due to epigastric abdominal pain which started yesterday in the morning.  Pain was constant and was similar to prior episode of pancreatitis.  Patient states that he has abstained from alcohol consumption, however, he had a lot  of liquor and beer when a friend visited him a day prior to presenting to the ED.  He endorsed having nausea without vomiting.   ED Course:  In the emergency department, BP was 135/105, other vital signs are within normal range.  Workup in the ED showed WBC 19.9, hemoglobin 18.16, hematocrit 52.3, platelets 292.  BMP was normal except for chloride of 96, bicarb 20, anion gap 19, lipase 710, urinalysis was unimpressive for UTI. CT abdomen and pelvis showed acute uncomplicated pancreatitis with moderate parenchymal edema and extensive peripancreatic fat stranding.  No fluid collection, pseudocyst or abscess. Incidental likely transient small bowel- small bowel intussusception within the left mid abdomen.  No bowel obstruction. Patient was treated with Dilaudid, Ativan was given due to anxiety, Zofran was given and patient was provided with IV hydration. Gastroenterologist (Dr. Tasia Catchings) was consulted and will follow-up with patient in the morning.  Hospitalist was asked to admit patient for further evaluation and management.  Significant Events: Admitted 04/16/2023 for acute pancreatitis   Significant Labs: WBC 19.9, HgB 18.6, Plt 292 Lipase 710 Na 135, K 4.0, CO2 of 20, BUN 11, scr 0.67, glu 96  Significant Imaging Studies: Ct Abd shows Acute uncomplicated pancreatitis, with moderate parenchymal edema and extensive peripancreatic fat stranding. No fluid collection, pseudocyst, or abscess. 2. Small volume ascites throughout the upper abdomen. 3. Hepatic steatosis. 4. Incidental likely transient small  bowel-small bowel  intussusception within the left mid abdomen. No bowel obstruction. RUQ U/S Increased hepatic echogenicity, a nonspecific finding that is most commonly seen on the basis of steatosis in the absence of known liver disease. 2. Small volume dependent  sludge without sonographic evidence of acute cholecystitis. 3. Trace perihepatic ascites  Antibiotic Therapy: Anti-infectives (From admission, onward)    None       Procedures:   Consultants: GI

## 2023-04-18 NOTE — Assessment & Plan Note (Signed)
 04-18-2023 pt has had a etoh problem for many years. He has had pancreatitis before. Pt and mother aware pt needs to stop consuming etoh forever.  04-19-2023 DC to home with prn librium.

## 2023-04-18 NOTE — Progress Notes (Signed)
 Subjective: Patient notes feeling mildly better today.  Wishes to try food.  No nausea or vomiting.  Pain steadily improving.  Objective: Vital signs in last 24 hours: Temp:  [97.5 F (36.4 C)-98.2 F (36.8 C)] 97.6 F (36.4 C) (03/01 0410) Pulse Rate:  [57-79] 57 (03/01 0410) Resp:  [17-20] 20 (03/01 0410) BP: (122-139)/(90-99) 134/94 (03/01 0410) SpO2:  [99 %-100 %] 100 % (03/01 0410) Last BM Date : 04/16/23 General:   Alert and oriented, pleasant Head:  Normocephalic and atraumatic. Eyes:  No icterus, sclera clear. Conjuctiva pink.  Abdomen:  Bowel sounds present, soft, tender to palpation epigastric region, non-distended. No HSM or hernias noted. No rebound or guarding. No masses appreciated  Msk:  Symmetrical without gross deformities. Normal posture. Extremities:  Without clubbing or edema. Neurologic:  Alert and  oriented x4;  grossly normal neurologically. Skin:  Warm and dry, intact without significant lesions.  Cervical Nodes:  No significant cervical adenopathy. Psych:  Alert and cooperative. Normal mood and affect.  Intake/Output from previous day: No intake/output data recorded. Intake/Output this shift: Total I/O In: 120 [P.O.:120] Out: -   Lab Results: Recent Labs    04/16/23 1730 04/17/23 0605 04/18/23 0354  WBC 19.9* 11.0* 6.8  HGB 18.6* 16.5 15.1  HCT 52.3* 48.3 44.0  PLT 292 175 136*   BMET Recent Labs    04/16/23 1730 04/17/23 0605 04/18/23 0354  NA 135 134* 136  K 4.0 4.6 3.5  CL 96* 99 97*  CO2 20* 29 28  GLUCOSE 96 98 83  BUN 11 9 5*  CREATININE 0.67 0.79 0.61  CALCIUM 9.8 8.9 9.1   LFT Recent Labs    04/16/23 1730 04/17/23 0605 04/18/23 0354  PROT 7.8 6.3* 6.3*  ALBUMIN 5.0 3.9 3.9  AST 27 20 18   ALT 20 15 12   ALKPHOS 83 66 61  BILITOT 1.1 1.4* 1.2   PT/INR No results for input(s): "LABPROT", "INR" in the last 72 hours. Hepatitis Panel No results for input(s): "HEPBSAG", "HCVAB", "HEPAIGM", "HEPBIGM" in the last 72  hours.   Studies/Results: US Abdomen Limited Result Date: 04/17/2023 CLINICAL DATA:  6216 Acute pancreatitis 6216 EXAM: ULTRASOUND ABDOMEN LIMITED RIGHT UPPER QUADRANT COMPARISON:  CT scan abdomen and pelvis from 04/16/2023. FINDINGS: Gallbladder: Gallbladder is physiologically distended. No abnormal wall thickening. Small volume dependent sludge noted. No pericholecystic free fluid. Sonographic Murphy's sign was negative as the technologist. Common bile duct: Diameter: 4.5-6.5 mm.  No intrahepatic bile dilation. Liver: There is increased hepatic echogenicity which reduces the sensitivity of ultrasound for the detection of focal masses. That being said, no focal mass is identified. Portal vein is patent on color Doppler imaging with normal direction of blood flow towards the liver. Other: Trace perihepatic ascites noted. IMPRESSION: 1. Increased hepatic echogenicity, a nonspecific finding that is most commonly seen on the basis of steatosis in the absence of known liver disease. 2. Small volume dependent sludge without sonographic evidence of acute cholecystitis. 3. Trace perihepatic ascites. Electronically Signed   By: Jules Schick M.D.   On: 04/17/2023 09:12   CT ABDOMEN PELVIS W CONTRAST Result Date: 04/16/2023 CLINICAL DATA:  Epigastric pain, nausea, history of pancreatitis EXAM: CT ABDOMEN AND PELVIS WITH CONTRAST TECHNIQUE: Multidetector CT imaging of the abdomen and pelvis was performed using the standard protocol following bolus administration of intravenous contrast. RADIATION DOSE REDUCTION: This exam was performed according to the departmental dose-optimization program which includes automated exposure control, adjustment of the mA and/or kV according to  patient size and/or use of iterative reconstruction technique. CONTRAST:  OMNIPAQUE IOHEXOL 300 MG/ML  SOLN COMPARISON:  01/09/2023 FINDINGS: Lower chest: No acute pleural or parenchymal lung disease. Hepatobiliary: Hepatic steatosis. No  focal liver abnormality is seen. No gallstones, gallbladder wall thickening, or biliary dilatation. Pancreas: Pancreas is heterogeneous with prominent edema within the pancreatic head and uncinate process. Marked peripancreatic fat stranding compatible with acute pancreatitis. No localized fluid collection, abscess, or pseudocyst. Spleen: Normal in size without focal abnormality. Adrenals/Urinary Tract: Adrenal glands are unremarkable. Kidneys are normal, without renal calculi, focal lesion, or hydronephrosis. Bladder is unremarkable. Stomach/Bowel: No bowel obstruction or ileus. Normal retrocecal appendix. There is a likely transient short segment small bowel-small bowel intussusception within the left mid abdomen, extending approximately 3 cm in length. This is best seen on image 61/4 and image 38/2. Vascular/Lymphatic: No significant vascular findings are present. No enlarged abdominal or pelvic lymph nodes. Reproductive: Prostate is unremarkable. Other: Small volume ascites most pronounced within the central upper abdomen and right mid abdomen. No free intraperitoneal gas. No abdominal wall hernia. Musculoskeletal: No acute or destructive bony abnormalities. Reconstructed images demonstrate no additional findings. IMPRESSION: 1. Acute uncomplicated pancreatitis, with moderate parenchymal edema and extensive peripancreatic fat stranding. No fluid collection, pseudocyst, or abscess. 2. Small volume ascites throughout the upper abdomen. 3. Hepatic steatosis. 4. Incidental likely transient small bowel-small bowel intussusception within the left mid abdomen. No bowel obstruction. Electronically Signed   By: Sharlet Salina M.D.   On: 04/16/2023 21:05    Assessment/Plan:  1.  Acute uncomplicated pancreatitis-alcohol induced.  Ultrasound did show some gallbladder sludge though LFTs have been normal.  Likely alcohol culprit.  Tolerating clear liquid diet, will advance today.  Antiemetics on board as needed.   Analgesia per hospitalist.  Continue pantoprazole daily.  Absolute alcohol avoidance.  2.  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  CT this admission with no evidence of splenic vein thrombus.  Continue to monitor.  3.  Epigastric pain-due to above.  4.  Nausea and vomiting-due to above, resolved.  Hennie Duos. Marletta Lor, D.O. Gastroenterology and Hepatology [facility]   LOS: 2 days    04/18/2023, 12:44 PM

## 2023-04-18 NOTE — Assessment & Plan Note (Signed)
 04-18-2023 only seen transiently on CT. Pt passing flatus but no BM yet. Adv to full liquid diet.  04-19-2023 only transiently seen on CT. Resolved.

## 2023-04-18 NOTE — Progress Notes (Signed)
 PROGRESS NOTE    Bryan Edwards  ZOX:[id number] DOB: 1995-04-25 DOA: 04/16/2023 PCP: Pcp, No  Subjective: Pt seen and examined. Met with pt and his mother at bedside. Pt admits to binge drinking etoh prior to abd pain starting.  Lipase improved. Wants to try full liquid diet. Understand he cannot go home on IV dilaudid. Agreeable to changing to po pain meds and stopping IV dilaudid.   Hospital Course: HPI: Bryan Edwards is a 27 y.o. male with medical history significant of heavy alcohol abuse, tobacco abuse who presents to the emergency department due to epigastric abdominal pain which started yesterday in the morning.  Pain was constant and was similar to prior episode of pancreatitis.  Patient states that he has abstained from alcohol consumption, however, he had a lot  of liquor and beer when a friend visited him a day prior to presenting to the ED.  He endorsed having nausea without vomiting.   ED Course:  In the emergency department, BP was 135/105, other vital signs are within normal range.  Workup in the ED showed WBC 19.9, hemoglobin 18.16, hematocrit 52.3, platelets 292.  BMP was normal except for chloride of 96, bicarb 20, anion gap 19, lipase 710, urinalysis was unimpressive for UTI. CT abdomen and pelvis showed acute uncomplicated pancreatitis with moderate parenchymal edema and extensive peripancreatic fat stranding.  No fluid collection, pseudocyst or abscess. Incidental likely transient small bowel- small bowel intussusception within the left mid abdomen.  No bowel obstruction. Patient was treated with Dilaudid, Ativan was given due to anxiety, Zofran was given and patient was provided with IV hydration. Gastroenterologist (Dr. Tasia Catchings) was consulted and will follow-up with patient in the morning.  Hospitalist was asked to admit patient for further evaluation and management.  Significant Events: Admitted 04/16/2023 for acute pancreatitis   Significant Labs: WBC 19.9, HgB 18.6, Plt  292 Lipase 710 Na 135, K 4.0, CO2 of 20, BUN 11, scr 0.67, glu 96  Significant Imaging Studies: Ct Abd shows Acute uncomplicated pancreatitis, with moderate parenchymal edema and extensive peripancreatic fat stranding. No fluid collection, pseudocyst, or abscess. 2. Small volume ascites throughout the upper abdomen. 3. Hepatic steatosis. 4. Incidental likely transient small  bowel-small bowel intussusception within the left mid abdomen. No bowel obstruction. RUQ U/S Increased hepatic echogenicity, a nonspecific finding that is most commonly seen on the basis of steatosis in the absence of known liver disease. 2. Small volume dependent  sludge without sonographic evidence of acute cholecystitis. 3. Trace perihepatic ascites  Antibiotic Therapy: Anti-infectives (From admission, onward)    None       Procedures:   Consultants: GI    Assessment and Plan: * Acute alcoholic pancreatitis 04-18-2023 adv to full liquid diet today. Stop IV dilaudid. Start po oxycodone q4h prn. Change to po protonix. Advised that he needs to completely abstain from ETOH the rest of his life. Pt's mother in agreement.  Alcohol abuse 04-18-2023 pt has had a etoh problem for many years. He has had pancreatitis before. Pt and mother aware pt needs to stop consuming etoh forever.  Intussusception (HCC) 04-18-2023 only seen transiently on CT. Pt passing flatus but no BM yet. Adv to full liquid diet.  DVT prophylaxis: SCDs Start: 04/17/23 0541    Code Status: Full Code Family Communication: discussed with pt and his mother at bedside Disposition Plan: return home Reason for continuing need for hospitalization: starting full liquid diet today. Monitor off IV dilaudid.  Objective: Vitals:   04/17/23 1300 04/17/23 1800  04/17/23 2202 04/18/23 0410  BP: (!) 139/99 (!) 135/96 (!) 122/90 (!) 134/94  Pulse: 79 73 69 (!) 57  Resp: 17  20 20   Temp: 98.2 F (36.8 C)  (!) 97.5 F (36.4 C) 97.6 F (36.4 C)   TempSrc:   Oral Oral  SpO2: 100%  99% 100%  Weight:      Height:        Intake/Output Summary (Last 24 hours) at 04/18/2023 1014 Last data filed at 04/18/2023 0900 Gross per 24 hour  Intake 120 ml  Output --  Net 120 ml   Filed Weights   04/16/23 1639 04/17/23 0840  Weight: 64.9 kg 67.8 kg   Examination:  Physical Exam Vitals and nursing note reviewed.  Constitutional:      General: He is not in acute distress.    Appearance: He is normal weight. He is not toxic-appearing or diaphoretic.  HENT:     Head: Normocephalic and atraumatic.     Nose: Nose normal.  Eyes:     General: No scleral icterus. Cardiovascular:     Rate and Rhythm: Normal rate and regular rhythm.  Pulmonary:     Effort: Pulmonary effort is normal. No respiratory distress.     Breath sounds: Normal breath sounds.  Abdominal:     General: Abdomen is flat. Bowel sounds are normal. There is no distension.     Palpations: Abdomen is soft.     Tenderness: There is abdominal tenderness. There is no guarding or rebound.    Musculoskeletal:     Right lower leg: No edema.     Left lower leg: No edema.  Skin:    General: Skin is warm and dry.     Capillary Refill: Capillary refill takes less than 2 seconds.  Neurological:     General: No focal deficit present.     Mental Status: He is alert and oriented to person, place, and time.   Data Reviewed: I have personally reviewed following labs and imaging studies  CBC: Recent Labs  Lab 04/16/23 1730 04/17/23 0605 04/18/23 0354  WBC 19.9* 11.0* 6.8  NEUTROABS 15.8*  --   --   HGB 18.6* 16.5 15.1  HCT 52.3* 48.3 44.0  MCV 85.3 88.3 90.0  PLT 292 175 136*   Basic Metabolic Panel: Recent Labs  Lab 04/16/23 1730 04/17/23 0605 04/18/23 0354  NA 135 134* 136  K 4.0 4.6 3.5  CL 96* 99 97*  CO2 20* 29 28  GLUCOSE 96 98 83  BUN 11 9 5*  CREATININE 0.67 0.79 0.61  CALCIUM 9.8 8.9 9.1  MG  --  1.8 2.0  PHOS  --   --  2.8   GFR: Estimated  Creatinine Clearance: 130.7 mL/min (by C-G formula based on SCr of 0.61 mg/dL). Liver Function Tests: Recent Labs  Lab 04/16/23 1730 04/17/23 0605 04/18/23 0354  AST 27 20 18   ALT 20 15 12   ALKPHOS 83 66 61  BILITOT 1.1 1.4* 1.2  PROT 7.8 6.3* 6.3*  ALBUMIN 5.0 3.9 3.9   Recent Labs  Lab 04/16/23 1730 04/18/23 0354  LIPASE 710* 312*    Radiology Studies: US Abdomen Limited Result Date: 04/17/2023 CLINICAL DATA:  6216 Acute pancreatitis 6216 EXAM: ULTRASOUND ABDOMEN LIMITED RIGHT UPPER QUADRANT COMPARISON:  CT scan abdomen and pelvis from 04/16/2023. FINDINGS: Gallbladder: Gallbladder is physiologically distended. No abnormal wall thickening. Small volume dependent sludge noted. No pericholecystic free fluid. Sonographic Murphy's sign was negative as the technologist. Common bile  duct: Diameter: 4.5-6.5 mm.  No intrahepatic bile dilation. Liver: There is increased hepatic echogenicity which reduces the sensitivity of ultrasound for the detection of focal masses. That being said, no focal mass is identified. Portal vein is patent on color Doppler imaging with normal direction of blood flow towards the liver. Other: Trace perihepatic ascites noted. IMPRESSION: 1. Increased hepatic echogenicity, a nonspecific finding that is most commonly seen on the basis of steatosis in the absence of known liver disease. 2. Small volume dependent sludge without sonographic evidence of acute cholecystitis. 3. Trace perihepatic ascites. Electronically Signed   By: Jules Schick M.D.   On: 04/17/2023 09:12   CT ABDOMEN PELVIS W CONTRAST Result Date: 04/16/2023 CLINICAL DATA:  Epigastric pain, nausea, history of pancreatitis EXAM: CT ABDOMEN AND PELVIS WITH CONTRAST TECHNIQUE: Multidetector CT imaging of the abdomen and pelvis was performed using the standard protocol following bolus administration of intravenous contrast. RADIATION DOSE REDUCTION: This exam was performed according to the departmental  dose-optimization program which includes automated exposure control, adjustment of the mA and/or kV according to patient size and/or use of iterative reconstruction technique. CONTRAST:  OMNIPAQUE IOHEXOL 300 MG/ML  SOLN COMPARISON:  01/09/2023 FINDINGS: Lower chest: No acute pleural or parenchymal lung disease. Hepatobiliary: Hepatic steatosis. No focal liver abnormality is seen. No gallstones, gallbladder wall thickening, or biliary dilatation. Pancreas: Pancreas is heterogeneous with prominent edema within the pancreatic head and uncinate process. Marked peripancreatic fat stranding compatible with acute pancreatitis. No localized fluid collection, abscess, or pseudocyst. Spleen: Normal in size without focal abnormality. Adrenals/Urinary Tract: Adrenal glands are unremarkable. Kidneys are normal, without renal calculi, focal lesion, or hydronephrosis. Bladder is unremarkable. Stomach/Bowel: No bowel obstruction or ileus. Normal retrocecal appendix. There is a likely transient short segment small bowel-small bowel intussusception within the left mid abdomen, extending approximately 3 cm in length. This is best seen on image 61/4 and image 38/2. Vascular/Lymphatic: No significant vascular findings are present. No enlarged abdominal or pelvic lymph nodes. Reproductive: Prostate is unremarkable. Other: Small volume ascites most pronounced within the central upper abdomen and right mid abdomen. No free intraperitoneal gas. No abdominal wall hernia. Musculoskeletal: No acute or destructive bony abnormalities. Reconstructed images demonstrate no additional findings. IMPRESSION: 1. Acute uncomplicated pancreatitis, with moderate parenchymal edema and extensive peripancreatic fat stranding. No fluid collection, pseudocyst, or abscess. 2. Small volume ascites throughout the upper abdomen. 3. Hepatic steatosis. 4. Incidental likely transient small bowel-small bowel intussusception within the left mid abdomen. No bowel  obstruction. Electronically Signed   By: Sharlet Salina M.D.   On: 04/16/2023 21:05    Scheduled Meds:  pantoprazole  40 mg Oral BID   Continuous Infusions:   LOS: 2 days   Time spent: 40 minutes  Carollee Herter, DO  [facility]  04/18/2023, 10:14 AM

## 2023-04-19 DIAGNOSIS — F1010 Alcohol abuse, uncomplicated: Secondary | ICD-10-CM | POA: Diagnosis not present

## 2023-04-19 DIAGNOSIS — K561 Intussusception: Secondary | ICD-10-CM | POA: Diagnosis not present

## 2023-04-19 DIAGNOSIS — K852 Alcohol induced acute pancreatitis without necrosis or infection: Secondary | ICD-10-CM | POA: Diagnosis not present

## 2023-04-19 LAB — LIPASE, BLOOD: Lipase: 249 U/L — ABNORMAL HIGH (ref 11–51)

## 2023-04-19 MED ORDER — CHLORDIAZEPOXIDE HCL 5 MG PO CAPS: 5.0000 mg | ORAL_CAPSULE | Freq: Three times a day (TID) | ORAL | 0 refills | Status: AC | PRN

## 2023-04-19 MED ORDER — ONDANSETRON HCL 4 MG PO TABS: 4.0000 mg | ORAL_TABLET | Freq: Four times a day (QID) | ORAL | 0 refills | Status: DC | PRN

## 2023-04-19 MED ORDER — OXYCODONE HCL 5 MG PO TABS: 5.0000 mg | ORAL_TABLET | Freq: Four times a day (QID) | ORAL | 0 refills | Status: AC | PRN

## 2023-04-19 MED ORDER — PANTOPRAZOLE SODIUM 40 MG PO TBEC: 40.0000 mg | DELAYED_RELEASE_TABLET | Freq: Two times a day (BID) | ORAL | 0 refills | Status: DC

## 2023-04-19 NOTE — Progress Notes (Signed)
 PROGRESS NOTE    Bryan Edwards  NWG:[id number] DOB: 11-22-1995 DOA: 04/16/2023 PCP: Pcp, No  Subjective: Pt seen and examined.  Lipase down to 249. Still occ pain. Tolerating GI soft diet without any N/V.   Hospital Course: HPI: Bryan Edwards is a 28 y.o. male with medical history significant of heavy alcohol abuse, tobacco abuse who presents to the emergency department due to epigastric abdominal pain which started yesterday in the morning.  Pain was constant and was similar to prior episode of pancreatitis.  Patient states that he has abstained from alcohol consumption, however, he had a lot  of liquor and beer when a friend visited him a day prior to presenting to the ED.  He endorsed having nausea without vomiting.   ED Course:  In the emergency department, BP was 135/105, other vital signs are within normal range.  Workup in the ED showed WBC 19.9, hemoglobin 18.16, hematocrit 52.3, platelets 292.  BMP was normal except for chloride of 96, bicarb 20, anion gap 19, lipase 710, urinalysis was unimpressive for UTI. CT abdomen and pelvis showed acute uncomplicated pancreatitis with moderate parenchymal edema and extensive peripancreatic fat stranding.  No fluid collection, pseudocyst or abscess. Incidental likely transient small bowel- small bowel intussusception within the left mid abdomen.  No bowel obstruction. Patient was treated with Dilaudid, Ativan was given due to anxiety, Zofran was given and patient was provided with IV hydration. Gastroenterologist (Dr. Tasia Catchings) was consulted and will follow-up with patient in the morning.  Hospitalist was asked to admit patient for further evaluation and management.  Significant Events: Admitted 04/16/2023 for acute pancreatitis   Significant Labs: WBC 19.9, HgB 18.6, Plt 292 Lipase 710 Na 135, K 4.0, CO2 of 20, BUN 11, scr 0.67, glu 96  Significant Imaging Studies: Ct Abd shows Acute uncomplicated pancreatitis, with moderate parenchymal  edema and extensive peripancreatic fat stranding. No fluid collection, pseudocyst, or abscess. 2. Small volume ascites throughout the upper abdomen. 3. Hepatic steatosis. 4. Incidental likely transient small  bowel-small bowel intussusception within the left mid abdomen. No bowel obstruction. RUQ U/S Increased hepatic echogenicity, a nonspecific finding that is most commonly seen on the basis of steatosis in the absence of known liver disease. 2. Small volume dependent  sludge without sonographic evidence of acute cholecystitis. 3. Trace perihepatic ascites  Antibiotic Therapy: Anti-infectives (From admission, onward)    None       Procedures:   Consultants: GI    Assessment and Plan: * Acute alcoholic pancreatitis 04-18-2023 adv to full liquid diet today. Stop IV dilaudid. Start po oxycodone q4h prn. Change to po protonix. Advised that he needs to completely abstain from ETOH the rest of his life. Pt's mother in agreement.  04-19-2023 stable for DC. DC to home with 3 days of oxycodone prn. Advised pt he cannot drink ETOH for the rest of his life.  Alcohol abuse 04-18-2023 pt has had a etoh problem for many years. He has had pancreatitis before. Pt and mother aware pt needs to stop consuming etoh forever.  04-19-2023 DC to home with prn librium.  Intussusception (HCC) 04-18-2023 only seen transiently on CT. Pt passing flatus but no BM yet. Adv to full liquid diet.  04-19-2023 only transiently seen on CT. Resolved.   DVT prophylaxis: SCDs Start: 04/17/23 0541    Code Status: Full Code Family Communication: no family at bedside, he is decisional Disposition Plan: return home Reason for continuing need for hospitalization: medically stable for DC  Objective: Vitals:  04/18/23 0410 04/18/23 1250 04/18/23 2251 04/19/23 0419  BP: (!) 134/94 (!) 131/95 (!) 121/96 (!) 137/92  Pulse: (!) 57 73 (!) 59 61  Resp: 20 18 16 16   Temp: 97.6 F (36.4 C) 98 F (36.7 C) 98.6 F (37  C) 97.8 F (36.6 C)  TempSrc: Oral Oral Oral Oral  SpO2: 100% 100% 99% 97%  Weight:      Height:        Intake/Output Summary (Last 24 hours) at 04/19/2023 0855 Last data filed at 04/18/2023 1910 Gross per 24 hour  Intake 600 ml  Output --  Net 600 ml   Filed Weights   04/16/23 1639 04/17/23 0840  Weight: 64.9 kg 67.8 kg    Examination:  Physical Exam Vitals and nursing note reviewed.  Constitutional:      General: He is not in acute distress.    Appearance: He is not toxic-appearing or diaphoretic.  HENT:     Head: Normocephalic and atraumatic.     Nose: Nose normal.  Eyes:     General: No scleral icterus. Cardiovascular:     Rate and Rhythm: Normal rate and regular rhythm.     Pulses: Normal pulses.     Heart sounds: Normal heart sounds.  Pulmonary:     Effort: Pulmonary effort is normal.     Breath sounds: Normal breath sounds.  Abdominal:     General: Abdomen is flat. There is no distension.     Palpations: Abdomen is soft.  Musculoskeletal:     Right lower leg: No edema.     Left lower leg: No edema.  Skin:    General: Skin is warm and dry.     Capillary Refill: Capillary refill takes less than 2 seconds.  Neurological:     General: No focal deficit present.     Mental Status: He is alert and oriented to person, place, and time.    Data Reviewed: I have personally reviewed following labs and imaging studies  CBC: Recent Labs  Lab 04/16/23 1730 04/17/23 0605 04/18/23 0354  WBC 19.9* 11.0* 6.8  NEUTROABS 15.8*  --   --   HGB 18.6* 16.5 15.1  HCT 52.3* 48.3 44.0  MCV 85.3 88.3 90.0  PLT 292 175 136*   Basic Metabolic Panel: Recent Labs  Lab 04/16/23 1730 04/17/23 0605 04/18/23 0354  NA 135 134* 136  K 4.0 4.6 3.5  CL 96* 99 97*  CO2 20* 29 28  GLUCOSE 96 98 83  BUN 11 9 5*  CREATININE 0.67 0.79 0.61  CALCIUM 9.8 8.9 9.1  MG  --  1.8 2.0  PHOS  --   --  2.8   GFR: Estimated Creatinine Clearance: 130.7 mL/min (by C-G formula based on  SCr of 0.61 mg/dL). Liver Function Tests: Recent Labs  Lab 04/16/23 1730 04/17/23 0605 04/18/23 0354  AST 27 20 18   ALT 20 15 12   ALKPHOS 83 66 61  BILITOT 1.1 1.4* 1.2  PROT 7.8 6.3* 6.3*  ALBUMIN 5.0 3.9 3.9   Recent Labs  Lab 04/16/23 1730 04/18/23 0354 04/19/23 0430  LIPASE 710* 312* 249*   Scheduled Meds:  nicotine  21 mg Transdermal Daily   pantoprazole  40 mg Oral BID   Continuous Infusions:   LOS: 3 days   Time spent: 45 minutes  Carollee Herter, DO  [facility]  04/19/2023, 8:55 AM

## 2023-04-19 NOTE — Discharge Summary (Signed)
 [facility] Phys[facility] Discharge Summary   Patient name: Bryan Edwards  Admit date:     04/16/2023  Discharge date: 04/19/2023  Attending Physician: Frankey Shown [[id number]]  Discharge Physician: Carollee Herter   PCP: Pcp, No  Admitted From: Home Disposition:  Home  Recommendations for Outpatient Follow-up:  Follow up with PCP in 1-2 weeks Abstain from ETOH forever.  Home Health:No Equipment/Devices: None  Discharge Condition:Stable CODE STATUS:FULL Diet recommendation:  low fat diet Fluid Restriction: None  Hospital Summary: HPI: Bryan Edwards is a 28 y.o. male with medical history significant of heavy alcohol abuse, tobacco abuse who presents to the emergency department due to epigastric abdominal pain which started yesterday in the morning.  Pain was constant and was similar to prior episode of pancreatitis.  Patient states that he has abstained from alcohol consumption, however, he had a lot  of liquor and beer when a friend visited him a day prior to presenting to the ED.  He endorsed having nausea without vomiting.   ED Course:  In the emergency department, BP was 135/105, other vital signs are within normal range.  Workup in the ED showed WBC 19.9, hemoglobin 18.16, hematocrit 52.3, platelets 292.  BMP was normal except for chloride of 96, bicarb 20, anion gap 19, lipase 710, urinalysis was unimpressive for UTI. CT abdomen and pelvis showed acute uncomplicated pancreatitis with moderate parenchymal edema and extensive peripancreatic fat stranding.  No fluid collection, pseudocyst or abscess. Incidental likely transient small bowel- small bowel intussusception within the left mid abdomen.  No bowel obstruction. Patient was treated with Dilaudid, Ativan was given due to anxiety, Zofran was given and patient was provided with IV hydration. Gastroenterologist (Dr. Tasia Catchings) was consulted and will follow-up with patient in the morning.  Hospitalist was asked to admit patient for  further evaluation and management.  Significant Events: Admitted 04/16/2023 for acute pancreatitis   Significant Labs: WBC 19.9, HgB 18.6, Plt 292 Lipase 710 Na 135, K 4.0, CO2 of 20, BUN 11, scr 0.67, glu 96  Significant Imaging Studies: Ct Abd shows Acute uncomplicated pancreatitis, with moderate parenchymal edema and extensive peripancreatic fat stranding. No fluid collection, pseudocyst, or abscess. 2. Small volume ascites throughout the upper abdomen. 3. Hepatic steatosis. 4. Incidental likely transient small  bowel-small bowel intussusception within the left mid abdomen. No bowel obstruction. RUQ U/S Increased hepatic echogenicity, a nonspecific finding that is most commonly seen on the basis of steatosis in the absence of known liver disease. 2. Small volume dependent  sludge without sonographic evidence of acute cholecystitis. 3. Trace perihepatic ascites  Antibiotic Therapy: Anti-infectives (From admission, onward)    None       Procedures:   Consultants: GI   Hospital Course by Problem: * Acute alcoholic pancreatitis 04-18-2023 adv to full liquid diet today. Stop IV dilaudid. Start po oxycodone q4h prn. Change to po protonix. Advised that he needs to completely abstain from ETOH the rest of his life. Pt's mother in agreement.  04-19-2023 stable for DC. DC to home with 3 days of oxycodone prn. Advised pt he cannot drink ETOH for the rest of his life.  Alcohol abuse 04-18-2023 pt has had a etoh problem for many years. He has had pancreatitis before. Pt and mother aware pt needs to stop consuming etoh forever.  04-19-2023 DC to home with prn librium.  Intussusception (HCC) 04-18-2023 only seen transiently on CT. Pt passing flatus but no BM yet. Adv to full liquid diet.  04-19-2023 only transiently seen on  CT. Resolved.   Discharge Diagnoses:  Principal Problem:   Acute alcoholic pancreatitis Active Problems:   Alcohol abuse   Intussusception (HCC)    Abdominal pain, epigastric   Discharge Instructions  Discharge Instructions     Call MD for:  difficulty breathing, headache or visual disturbances   Complete by: As directed    Call MD for:  extreme fatigue   Complete by: As directed    Call MD for:  hives   Complete by: As directed    Call MD for:  persistant dizziness or light-headedness   Complete by: As directed    Call MD for:  persistant nausea and vomiting   Complete by: As directed    Call MD for:  redness, tenderness, or signs of infection (pain, swelling, redness, odor or green/yellow discharge around incision site)   Complete by: As directed    Call MD for:  severe uncontrolled pain   Complete by: As directed    Call MD for:  temperature >100.4   Complete by: As directed    Diet Heart   Complete by: As directed    Discharge instructions   Complete by: As directed    1. Follow up with your primary care provider in 1-2 weeks after discharge from hospital. 2. Absolutely NO alcohol consumption of any kind FOREVER! 3. Stay on a low fat diet. No grease, oil, fat in your diet for a minimum of 2 weeks.   Increase activity slowly   Complete by: As directed       Allergies as of 04/19/2023       Reactions   Amoxicillin Other (See Comments)   Makes tongue and throat hurt, dries it out   Bee Pollen Other (See Comments)   Seasonal allergies   Penicillins Swelling        Medication List     TAKE these medications    chlordiazePOXIDE 5 MG capsule Commonly known as: LIBRIUM Take 1 capsule (5 mg total) by mouth 3 (three) times daily as needed for up to 3 days for withdrawal.   multivitamin tablet Take 1 tablet by mouth daily.   ondansetron 4 MG tablet Commonly known as: ZOFRAN Take 1 tablet (4 mg total) by mouth every 6 (six) hours as needed for nausea or vomiting.   oxyCODONE 5 MG immediate release tablet Commonly known as: Oxy IR/ROXICODONE Take 1 tablet (5 mg total) by mouth every 6 (six) hours as needed  for up to 3 days for moderate pain (pain score 4-6) or severe pain (pain score 7-10).   pantoprazole 40 MG tablet Commonly known as: PROTONIX Take 1 tablet (40 mg total) by mouth 2 (two) times daily.        Allergies  Allergen Reactions   Amoxicillin Other (See Comments)    Makes tongue and throat hurt, dries it out   Bee Pollen Other (See Comments)    Seasonal allergies   Penicillins Swelling    Discharge Exam: Vitals:   04/18/23 2251 04/19/23 0419  BP: (!) 121/96 (!) 137/92  Pulse: (!) 59 61  Resp: 16 16  Temp: 98.6 F (37 C) 97.8 F (36.6 C)  SpO2: 99% 97%    Physical Exam Vitals and nursing note reviewed.  Constitutional:      General: He is not in acute distress.    Appearance: He is not toxic-appearing or diaphoretic.  HENT:     Head: Normocephalic and atraumatic.     Nose: Nose normal.  Eyes:  General: No scleral icterus. Cardiovascular:     Rate and Rhythm: Normal rate and regular rhythm.     Pulses: Normal pulses.     Heart sounds: Normal heart sounds.  Pulmonary:     Effort: Pulmonary effort is normal.     Breath sounds: Normal breath sounds.  Abdominal:     General: Abdomen is flat. There is no distension.     Palpations: Abdomen is soft.  Musculoskeletal:     Right lower leg: No edema.     Left lower leg: No edema.  Skin:    General: Skin is warm and dry.     Capillary Refill: Capillary refill takes less than 2 seconds.  Neurological:     General: No focal deficit present.     Mental Status: He is alert and oriented to person, place, and time.     The results of significant diagnostics from this hospitalization (including imaging, microbiology, ancillary and laboratory) are listed below for reference.     Labs: Basic Metabolic Panel: Recent Labs  Lab 04/16/23 1730 04/17/23 0605 04/18/23 0354  NA 135 134* 136  K 4.0 4.6 3.5  CL 96* 99 97*  CO2 20* 29 28  GLUCOSE 96 98 83  BUN 11 9 5*  CREATININE 0.67 0.79 0.61  CALCIUM 9.8  8.9 9.1  MG  --  1.8 2.0  PHOS  --   --  2.8   Liver Function Tests: Recent Labs  Lab 04/16/23 1730 04/17/23 0605 04/18/23 0354  AST 27 20 18   ALT 20 15 12   ALKPHOS 83 66 61  BILITOT 1.1 1.4* 1.2  PROT 7.8 6.3* 6.3*  ALBUMIN 5.0 3.9 3.9   Recent Labs  Lab 04/16/23 1730 04/18/23 0354 04/19/23 0430  LIPASE 710* 312* 249*    CBC: Recent Labs  Lab 04/16/23 1730 04/17/23 0605 04/18/23 0354  WBC 19.9* 11.0* 6.8  NEUTROABS 15.8*  --   --   HGB 18.6* 16.5 15.1  HCT 52.3* 48.3 44.0  MCV 85.3 88.3 90.0  PLT 292 175 136*   Urinalysis    Component Value Date/Time   COLORURINE YELLOW 04/16/2023 1730   APPEARANCEUR HAZY (A) 04/16/2023 1730   LABSPEC 1.014 04/16/2023 1730   PHURINE 5.0 04/16/2023 1730   GLUCOSEU NEGATIVE 04/16/2023 1730   HGBUR SMALL (A) 04/16/2023 1730   BILIRUBINUR NEGATIVE 04/16/2023 1730   KETONESUR 5 (A) 04/16/2023 1730   PROTEINUR 30 (A) 04/16/2023 1730   NITRITE NEGATIVE 04/16/2023 1730   LEUKOCYTESUR NEGATIVE 04/16/2023 1730   Sepsis Labs Recent Labs  Lab 04/16/23 1730 04/17/23 0605 04/18/23 0354  WBC 19.9* 11.0* 6.8    Procedures/Studies: US Abdomen Limited Result Date: 04/17/2023 CLINICAL DATA:  6216 Acute pancreatitis 6216 EXAM: ULTRASOUND ABDOMEN LIMITED RIGHT UPPER QUADRANT COMPARISON:  CT scan abdomen and pelvis from 04/16/2023. FINDINGS: Gallbladder: Gallbladder is physiologically distended. No abnormal wall thickening. Small volume dependent sludge noted. No pericholecystic free fluid. Sonographic Murphy's sign was negative as the technologist. Common bile duct: Diameter: 4.5-6.5 mm.  No intrahepatic bile dilation. Liver: There is increased hepatic echogenicity which reduces the sensitivity of ultrasound for the detection of focal masses. That being said, no focal mass is identified. Portal vein is patent on color Doppler imaging with normal direction of blood flow towards the liver. Other: Trace perihepatic ascites noted. IMPRESSION:  1. Increased hepatic echogenicity, a nonspecific finding that is most commonly seen on the basis of steatosis in the absence of known liver disease. 2. Small volume  dependent sludge without sonographic evidence of acute cholecystitis. 3. Trace perihepatic ascites. Electronically Signed   By: Jules Schick M.D.   On: 04/17/2023 09:12   CT ABDOMEN PELVIS W CONTRAST Result Date: 04/16/2023 CLINICAL DATA:  Epigastric pain, nausea, history of pancreatitis EXAM: CT ABDOMEN AND PELVIS WITH CONTRAST TECHNIQUE: Multidetector CT imaging of the abdomen and pelvis was performed using the standard protocol following bolus administration of intravenous contrast. RADIATION DOSE REDUCTION: This exam was performed according to the departmental dose-optimization program which includes automated exposure control, adjustment of the mA and/or kV according to patient size and/or use of iterative reconstruction technique. CONTRAST:  OMNIPAQUE IOHEXOL 300 MG/ML  SOLN COMPARISON:  01/09/2023 FINDINGS: Lower chest: No acute pleural or parenchymal lung disease. Hepatobiliary: Hepatic steatosis. No focal liver abnormality is seen. No gallstones, gallbladder wall thickening, or biliary dilatation. Pancreas: Pancreas is heterogeneous with prominent edema within the pancreatic head and uncinate process. Marked peripancreatic fat stranding compatible with acute pancreatitis. No localized fluid collection, abscess, or pseudocyst. Spleen: Normal in size without focal abnormality. Adrenals/Urinary Tract: Adrenal glands are unremarkable. Kidneys are normal, without renal calculi, focal lesion, or hydronephrosis. Bladder is unremarkable. Stomach/Bowel: No bowel obstruction or ileus. Normal retrocecal appendix. There is a likely transient short segment small bowel-small bowel intussusception within the left mid abdomen, extending approximately 3 cm in length. This is best seen on image 61/4 and image 38/2. Vascular/Lymphatic: No significant  vascular findings are present. No enlarged abdominal or pelvic lymph nodes. Reproductive: Prostate is unremarkable. Other: Small volume ascites most pronounced within the central upper abdomen and right mid abdomen. No free intraperitoneal gas. No abdominal wall hernia. Musculoskeletal: No acute or destructive bony abnormalities. Reconstructed images demonstrate no additional findings. IMPRESSION: 1. Acute uncomplicated pancreatitis, with moderate parenchymal edema and extensive peripancreatic fat stranding. No fluid collection, pseudocyst, or abscess. 2. Small volume ascites throughout the upper abdomen. 3. Hepatic steatosis. 4. Incidental likely transient small bowel-small bowel intussusception within the left mid abdomen. No bowel obstruction. Electronically Signed   By: Sharlet Salina M.D.   On: 04/16/2023 21:05    Time coordinating discharge: 45 mins  SIGNED:  Carollee Herter, DO [facility] 04/19/23, 8:57 AM

## 2023-05-27 ENCOUNTER — Inpatient Hospital Stay (HOSPITAL_COMMUNITY)
Admission: EM | Admit: 2023-05-27 | Discharge: 2023-06-02 | DRG: 440 | Disposition: A | Discharge status: Home / Self Care | Attending: Internal Medicine | Admitting: Internal Medicine

## 2023-05-27 ENCOUNTER — Other Ambulatory Visit: Payer: Self-pay

## 2023-05-27 ENCOUNTER — Encounter (HOSPITAL_COMMUNITY): Payer: Self-pay | Admitting: Emergency Medicine

## 2023-05-27 DIAGNOSIS — Z880 Allergy status to penicillin: Secondary | ICD-10-CM

## 2023-05-27 DIAGNOSIS — F1010 Alcohol abuse, uncomplicated: Secondary | ICD-10-CM | POA: Diagnosis present

## 2023-05-27 DIAGNOSIS — K852 Alcohol induced acute pancreatitis without necrosis or infection: Principal | ICD-10-CM | POA: Diagnosis present

## 2023-05-27 DIAGNOSIS — F419 Anxiety disorder, unspecified: Secondary | ICD-10-CM | POA: Diagnosis present

## 2023-05-27 DIAGNOSIS — E876 Hypokalemia: Secondary | ICD-10-CM | POA: Diagnosis present

## 2023-05-27 DIAGNOSIS — Z86718 Personal history of other venous thrombosis and embolism: Secondary | ICD-10-CM

## 2023-05-27 DIAGNOSIS — Z91030 Bee allergy status: Secondary | ICD-10-CM

## 2023-05-27 DIAGNOSIS — F10939 Alcohol use, unspecified with withdrawal, unspecified: Secondary | ICD-10-CM | POA: Diagnosis present

## 2023-05-27 DIAGNOSIS — R748 Abnormal levels of other serum enzymes: Secondary | ICD-10-CM | POA: Diagnosis present

## 2023-05-27 DIAGNOSIS — K219 Gastro-esophageal reflux disease without esophagitis: Secondary | ICD-10-CM | POA: Diagnosis present

## 2023-05-27 DIAGNOSIS — K859 Acute pancreatitis without necrosis or infection, unspecified: Secondary | ICD-10-CM | POA: Diagnosis present

## 2023-05-27 DIAGNOSIS — Z716 Tobacco abuse counseling: Secondary | ICD-10-CM

## 2023-05-27 DIAGNOSIS — K828 Other specified diseases of gallbladder: Secondary | ICD-10-CM | POA: Diagnosis present

## 2023-05-27 DIAGNOSIS — Z720 Tobacco use: Secondary | ICD-10-CM | POA: Diagnosis present

## 2023-05-27 DIAGNOSIS — Z79899 Other long term (current) drug therapy: Secondary | ICD-10-CM

## 2023-05-27 DIAGNOSIS — K5900 Constipation, unspecified: Secondary | ICD-10-CM | POA: Diagnosis not present

## 2023-05-27 DIAGNOSIS — F17210 Nicotine dependence, cigarettes, uncomplicated: Secondary | ICD-10-CM | POA: Diagnosis present

## 2023-05-27 DIAGNOSIS — R011 Cardiac murmur, unspecified: Secondary | ICD-10-CM | POA: Diagnosis present

## 2023-05-27 LAB — COMPREHENSIVE METABOLIC PANEL WITH GFR
ALT: 25 U/L (ref 0–44)
AST: 32 U/L (ref 15–41)
Albumin: 4.8 g/dL (ref 3.5–5.0)
Alkaline Phosphatase: 88 U/L (ref 38–126)
Anion gap: 17 — ABNORMAL HIGH (ref 5–15)
BUN: 11 mg/dL (ref 6–20)
CO2: 24 mmol/L (ref 22–32)
Calcium: 9.4 mg/dL (ref 8.9–10.3)
Chloride: 93 mmol/L — ABNORMAL LOW (ref 98–111)
Creatinine, Ser: 0.67 mg/dL (ref 0.61–1.24)
GFR, Estimated: >60 mL/min (ref >60)
Glucose, Bld: 101 mg/dL — ABNORMAL HIGH (ref 70–99)
Potassium: 3.2 mmol/L — ABNORMAL LOW (ref 3.5–5.1)
Sodium: 134 mmol/L — ABNORMAL LOW (ref 135–145)
Total Bilirubin: 0.9 mg/dL (ref 0.0–1.2)
Total Protein: 7.4 g/dL (ref 6.5–8.1)

## 2023-05-27 LAB — CBC
HCT: 46.6 % (ref 39.0–52.0)
Hemoglobin: 16.7 g/dL (ref 13.0–17.0)
MCH: 30.9 pg (ref 26.0–34.0)
MCHC: 35.8 g/dL (ref 30.0–36.0)
MCV: 86.3 fL (ref 80.0–100.0)
Platelets: 218 10*3/uL (ref 150–400)
RBC: 5.4 MIL/uL (ref 4.22–5.81)
RDW: 13.5 % (ref 11.5–15.5)
WBC: 10.9 10*3/uL — ABNORMAL HIGH (ref 4.0–10.5)
nRBC: 0 % (ref 0.0–0.2)

## 2023-05-27 LAB — LIPASE, BLOOD: Lipase: 208 U/L — ABNORMAL HIGH (ref 11–51)

## 2023-05-27 MED ORDER — HYDROMORPHONE HCL 1 MG/ML IJ SOLN
1.0000 mg | Freq: Once | INTRAMUSCULAR | Status: AC
  Administered 2023-05-27: 1 mg via INTRAVENOUS
  Filled 2023-05-27: qty 1

## 2023-05-27 MED ORDER — ONDANSETRON HCL 4 MG/2ML IJ SOLN
4.0000 mg | Freq: Once | INTRAMUSCULAR | Status: AC
  Administered 2023-05-27: 4 mg via INTRAVENOUS
  Filled 2023-05-27: qty 2

## 2023-05-27 MED ORDER — SODIUM CHLORIDE 0.9 % IV BOLUS
1000.0000 mL | Freq: Once | INTRAVENOUS | Status: AC
  Administered 2023-05-27: 1000 mL via INTRAVENOUS

## 2023-05-27 NOTE — ED Triage Notes (Addendum)
 Pt arrives to ED c/o severe abd pain that radiates to his back. Pt states hx of pancreatitis and this feels the same. Pt states last ETOH use was yesterday.

## 2023-05-27 NOTE — ED Provider Notes (Signed)
 [facility] Emergency Department Provider Note MRN:  [id number]  Arrival date & time: 05/28/23     Chief Complaint   Abdominal Pain   History of Present Illness   Bryan Edwards is a 28 y.o. year-old male with a history of pancreatitis presenting to the ED with chief complaint of abdominal pain.  Epigastric abdominal pain with radiation to the thoracic back.  Similar to prior episodes of pancreatitis but seems more severe than last time.  Had a recent admission for pancreatitis.  Was told to avoid alcohol at all costs but drank with his friend last night.  No fever, normal bowel movements, nausea, no vomiting.  Review of Systems  A thorough review of systems was obtained and all systems are negative except as noted in the HPI and PMH.   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 No pertinent surgical history.  History reviewed. No pertinent family history.  Social History   Socioeconomic History   Marital status: Single    Spouse name: Not on file   Number of children: Not on file   Years of education: Not on file   Highest education level: Not on file  Occupational History   Not on file  Tobacco Use   Smoking status: Every Day    Current packs/day: 1.00    Types: Cigarettes   Smokeless tobacco: Not on file  Vaping Use   Vaping status: Never Used  Substance and Sexual Activity   Alcohol use: Yes    Comment: few times weekly   Drug use: No   Sexual activity: Not on file  Other Topics Concern   Not on file  Social History Narrative   Not on file   Social Drivers of Health   Financial Resource Strain: Not on file  Food Insecurity: No Food Insecurity (04/17/2023)   Hunger Vital Sign    Worried About Running Out of Food in the Last Year: Never true    Ran Out of Food in the Last Year: Never true  Transportation  Needs: No Transportation Needs (04/17/2023)   PRAPARE - Administrator, Civil Service (Medical): No    Lack of Transportation (Non-Medical): No  Physical Activity: Not on file  Stress: Not on file  Social Connections: Not on file  Intimate Partner Violence: Not At Risk (04/17/2023)   Humiliation, Afraid, Rape, and Kick questionnaire    Fear of Current or Ex-Partner: No    Emotionally Abused: No    Physically Abused: No    Sexually Abused: No     Physical Exam   Vitals:   05/27/23 2300 05/28/23 0000  BP: (!) 139/98 (!) 151/99  Pulse: 85 87  Resp:  20  Temp:    SpO2: 97% 96%    CONSTITUTIONAL: Well-appearing, moderate distress due to pain NEURO/PSYCH:  Alert and oriented x 3, no focal deficits EYES:  eyes equal and reactive ENT/NECK:  no LAD, no JVD CARDIO: Regular rate, well-perfused, normal S1 and S2 PULM:  CTAB no wheezing or rhonchi GI/GU:  non-distended, moderate epigastric tenderness to palpation MSK/SPINE:  No gross deformities, no edema SKIN:  no rash, atraumatic   *Additional and/or pertinent findings included in MDM below  Diagnostic and Interventional Summary    EKG Interpretation Date/Time:    Ventricular Rate:    PR  Interval:    QRS Duration:    QT Interval:    QTC Calculation:   R Axis:      Text Interpretation:         Labs Reviewed  LIPASE, BLOOD - Abnormal; Notable for the following components:      Result Value   Lipase 208 (*)    All other components within normal limits  COMPREHENSIVE METABOLIC PANEL WITH GFR - Abnormal; Notable for the following components:   Sodium 134 (*)    Potassium 3.2 (*)    Chloride 93 (*)    Glucose, Bld 101 (*)    Anion gap 17 (*)    All other components within normal limits  CBC - Abnormal; Notable for the following components:   WBC 10.9 (*)    All other components within normal limits  URINALYSIS, ROUTINE W REFLEX MICROSCOPIC    No orders to display    Medications  HYDROmorphone  (DILAUDID) injection 1 mg (has no administration in time range)  lactated ringers bolus 1,000 mL (has no administration in time range)  sodium chloride 0.9 % bolus 1,000 mL (1,000 mLs Intravenous New Bag/Given 05/27/23 2313)  ondansetron (ZOFRAN) injection 4 mg (4 mg Intravenous Given 05/27/23 2313)  HYDROmorphone (DILAUDID) injection 1 mg (1 mg Intravenous Given 05/27/23 2313)  HYDROmorphone (DILAUDID) injection 1 mg (1 mg Intravenous Given 05/27/23 2353)     Procedures  /  Critical Care Procedures  ED Course and Medical Decision Making  Initial Impression and Ddx Suspect acute on chronic pancreatitis in the setting of recent alcohol use.  Pain control, labs, will reassess.  Past medical/surgical history that increases complexity of ED encounter: History of alcohol use disorder, history of pancreatitis  Interpretation of Diagnostics I personally reviewed the Laboratory Testing and my interpretation is as follows: Elevated lipase, otherwise no significant blood count or electrolyte disturbance.    Patient Reassessment and Ultimate Disposition/Management     Continued pain despite aggressive attempts at pain management, will request hospitalist admission.  Abdomen continues to be soft on reassessment, suspect that repeated CT imaging at this time would be low yield.  Patient management required discussion with the following services or consulting groups:  Hospitalist Service  Complexity of Problems Addressed Acute illness or injury that poses threat of life of bodily function  Additional Data Reviewed and Analyzed Further history obtained from: Further history from spouse/family member  Additional Factors Impacting ED Encounter Risk Use of parenteral controlled substances and Consideration of hospitalization  Elmer Sow. Pilar Plate, MD [facility] Emergency Medicine [facility] [email or web address]  Final Clinical Impressions(s) / ED Diagnoses     ICD-10-CM   1.  Alcohol-induced acute pancreatitis, unspecified complication status  K85.20       ED Discharge Orders     None        Discharge Instructions Discussed with and Provided to Patient:   Discharge Instructions   None      Sabas Sous, MD 05/28/23 [phone]

## 2023-05-28 ENCOUNTER — Inpatient Hospital Stay (HOSPITAL_COMMUNITY)

## 2023-05-28 ENCOUNTER — Other Ambulatory Visit: Payer: Self-pay

## 2023-05-28 ENCOUNTER — Encounter (HOSPITAL_COMMUNITY): Payer: Self-pay | Admitting: Radiology

## 2023-05-28 DIAGNOSIS — Z79899 Other long term (current) drug therapy: Secondary | ICD-10-CM | POA: Diagnosis not present

## 2023-05-28 DIAGNOSIS — F17210 Nicotine dependence, cigarettes, uncomplicated: Secondary | ICD-10-CM | POA: Diagnosis present

## 2023-05-28 DIAGNOSIS — K859 Acute pancreatitis without necrosis or infection, unspecified: Secondary | ICD-10-CM | POA: Diagnosis present

## 2023-05-28 DIAGNOSIS — K828 Other specified diseases of gallbladder: Secondary | ICD-10-CM | POA: Diagnosis present

## 2023-05-28 DIAGNOSIS — E876 Hypokalemia: Secondary | ICD-10-CM

## 2023-05-28 DIAGNOSIS — F10930 Alcohol use, unspecified with withdrawal, uncomplicated: Secondary | ICD-10-CM | POA: Diagnosis not present

## 2023-05-28 DIAGNOSIS — Z86718 Personal history of other venous thrombosis and embolism: Secondary | ICD-10-CM | POA: Diagnosis not present

## 2023-05-28 DIAGNOSIS — K852 Alcohol induced acute pancreatitis without necrosis or infection: Secondary | ICD-10-CM | POA: Diagnosis present

## 2023-05-28 DIAGNOSIS — Z716 Tobacco abuse counseling: Secondary | ICD-10-CM | POA: Diagnosis not present

## 2023-05-28 DIAGNOSIS — K5900 Constipation, unspecified: Secondary | ICD-10-CM | POA: Diagnosis not present

## 2023-05-28 DIAGNOSIS — R748 Abnormal levels of other serum enzymes: Secondary | ICD-10-CM | POA: Diagnosis present

## 2023-05-28 DIAGNOSIS — R1013 Epigastric pain: Secondary | ICD-10-CM | POA: Diagnosis present

## 2023-05-28 DIAGNOSIS — Z720 Tobacco use: Secondary | ICD-10-CM | POA: Diagnosis not present

## 2023-05-28 DIAGNOSIS — F419 Anxiety disorder, unspecified: Secondary | ICD-10-CM | POA: Diagnosis present

## 2023-05-28 DIAGNOSIS — Z91030 Bee allergy status: Secondary | ICD-10-CM | POA: Diagnosis not present

## 2023-05-28 DIAGNOSIS — F1010 Alcohol abuse, uncomplicated: Secondary | ICD-10-CM | POA: Diagnosis present

## 2023-05-28 DIAGNOSIS — K219 Gastro-esophageal reflux disease without esophagitis: Secondary | ICD-10-CM | POA: Diagnosis present

## 2023-05-28 DIAGNOSIS — Z880 Allergy status to penicillin: Secondary | ICD-10-CM | POA: Diagnosis not present

## 2023-05-28 DIAGNOSIS — R011 Cardiac murmur, unspecified: Secondary | ICD-10-CM | POA: Diagnosis present

## 2023-05-28 LAB — URINALYSIS, ROUTINE W REFLEX MICROSCOPIC
Bacteria, UA: NONE SEEN
Bilirubin Urine: NEGATIVE
Glucose, UA: NEGATIVE mg/dL
Hgb urine dipstick: NEGATIVE
Ketones, ur: 5 mg/dL — AB
Leukocytes,Ua: NEGATIVE
Nitrite: NEGATIVE
Protein, ur: 30 mg/dL — AB
Specific Gravity, Urine: 1.013 (ref 1.005–1.030)
pH: 8 (ref 5.0–8.0)

## 2023-05-28 LAB — COMPREHENSIVE METABOLIC PANEL WITH GFR
ALT: 23 U/L (ref 0–44)
AST: 29 U/L (ref 15–41)
Albumin: 4.1 g/dL (ref 3.5–5.0)
Alkaline Phosphatase: 77 U/L (ref 38–126)
Anion gap: 14 (ref 5–15)
BUN: 9 mg/dL (ref 6–20)
CO2: 23 mmol/L (ref 22–32)
Calcium: 8.6 mg/dL — ABNORMAL LOW (ref 8.9–10.3)
Chloride: 97 mmol/L — ABNORMAL LOW (ref 98–111)
Creatinine, Ser: 0.64 mg/dL (ref 0.61–1.24)
GFR, Estimated: >60 mL/min (ref >60)
Glucose, Bld: 103 mg/dL — ABNORMAL HIGH (ref 70–99)
Potassium: 3.2 mmol/L — ABNORMAL LOW (ref 3.5–5.1)
Sodium: 134 mmol/L — ABNORMAL LOW (ref 135–145)
Total Bilirubin: 0.9 mg/dL (ref 0.0–1.2)
Total Protein: 6.4 g/dL — ABNORMAL LOW (ref 6.5–8.1)

## 2023-05-28 LAB — CBC
HCT: 43 % (ref 39.0–52.0)
Hemoglobin: 14.7 g/dL (ref 13.0–17.0)
MCH: 30.5 pg (ref 26.0–34.0)
MCHC: 34.2 g/dL (ref 30.0–36.0)
MCV: 89.2 fL (ref 80.0–100.0)
Platelets: 144 10*3/uL — ABNORMAL LOW (ref 150–400)
RBC: 4.82 MIL/uL (ref 4.22–5.81)
RDW: 13.3 % (ref 11.5–15.5)
WBC: 10.2 10*3/uL (ref 4.0–10.5)
nRBC: 0 % (ref 0.0–0.2)

## 2023-05-28 LAB — PHOSPHORUS: Phosphorus: 2.8 mg/dL (ref 2.5–4.6)

## 2023-05-28 LAB — MAGNESIUM: Magnesium: 1.3 mg/dL — ABNORMAL LOW (ref 1.7–2.4)

## 2023-05-28 LAB — ETHANOL: Alcohol, Ethyl (B): <10 mg/dL (ref <10)

## 2023-05-28 MED ORDER — LACTATED RINGERS IV BOLUS
1000.0000 mL | Freq: Once | INTRAVENOUS | Status: AC
  Administered 2023-05-28: 1000 mL via INTRAVENOUS

## 2023-05-28 MED ORDER — PANTOPRAZOLE SODIUM 40 MG PO TBEC: 40.0000 mg | DELAYED_RELEASE_TABLET | Freq: Every day | ORAL | Status: DC

## 2023-05-28 MED ORDER — ONDANSETRON HCL 4 MG PO TABS
4.0000 mg | ORAL_TABLET | Freq: Four times a day (QID) | ORAL | Status: DC | PRN
  Administered 2023-05-29 – 2023-06-01 (×4): 4 mg via ORAL
  Filled 2023-05-28 (×4): qty 1

## 2023-05-28 MED ORDER — HYDROMORPHONE HCL 1 MG/ML IJ SOLN
0.5000 mg | INTRAMUSCULAR | Status: DC | PRN
  Administered 2023-05-28: 0.5 mg via INTRAVENOUS
  Filled 2023-05-28: qty 0.5

## 2023-05-28 MED ORDER — FOLIC ACID 1 MG PO TABS
1.0000 mg | ORAL_TABLET | Freq: Every day | ORAL | Status: DC
  Administered 2023-05-28 – 2023-05-30 (×3): 1 mg via ORAL
  Filled 2023-05-28 (×3): qty 1

## 2023-05-28 MED ORDER — PANTOPRAZOLE SODIUM 40 MG PO TBEC
40.0000 mg | DELAYED_RELEASE_TABLET | Freq: Two times a day (BID) | ORAL | Status: DC
  Administered 2023-05-28 – 2023-05-31 (×7): 40 mg via ORAL
  Filled 2023-05-28 (×7): qty 1

## 2023-05-28 MED ORDER — OXYCODONE HCL 5 MG PO TABS: 5.0000 mg | ORAL_TABLET | Freq: Four times a day (QID) | ORAL | Status: DC | PRN

## 2023-05-28 MED ORDER — POTASSIUM CHLORIDE CRYS ER 20 MEQ PO TBCR
40.0000 meq | EXTENDED_RELEASE_TABLET | Freq: Once | ORAL | Status: AC
  Administered 2023-05-28: 40 meq via ORAL
  Filled 2023-05-28: qty 2

## 2023-05-28 MED ORDER — ACETAMINOPHEN 325 MG PO TABS: 650.0000 mg | ORAL_TABLET | Freq: Four times a day (QID) | ORAL | Status: DC | PRN

## 2023-05-28 MED ORDER — IOHEXOL 300 MG/ML  SOLN
100.0000 mL | Freq: Once | INTRAMUSCULAR | Status: AC | PRN
  Administered 2023-05-28: 100 mL via INTRAVENOUS

## 2023-05-28 MED ORDER — HYDROMORPHONE HCL 1 MG/ML IJ SOLN
0.5000 mg | INTRAMUSCULAR | Status: DC | PRN
Start: 2023-05-28 — End: 2023-05-31
  Administered 2023-05-28 – 2023-05-29 (×5): 1 mg via INTRAVENOUS
  Administered 2023-05-29: 0.5 mg via INTRAVENOUS
  Administered 2023-05-30 – 2023-05-31 (×6): 1 mg via INTRAVENOUS
  Filled 2023-05-28 (×12): qty 1

## 2023-05-28 MED ORDER — THIAMINE HCL 100 MG/ML IJ SOLN
100.0000 mg | Freq: Every day | INTRAMUSCULAR | Status: DC
  Administered 2023-05-29: 100 mg via INTRAVENOUS
  Filled 2023-05-28: qty 2

## 2023-05-28 MED ORDER — ORAL CARE MOUTH RINSE: 15.0000 mL | OROMUCOSAL | Status: DC | PRN

## 2023-05-28 MED ORDER — THIAMINE MONONITRATE 100 MG PO TABS
100.0000 mg | ORAL_TABLET | Freq: Every day | ORAL | Status: DC
  Administered 2023-05-28 – 2023-05-31 (×3): 100 mg via ORAL
  Filled 2023-05-28 (×4): qty 1

## 2023-05-28 MED ORDER — LORAZEPAM 1 MG PO TABS
1.0000 mg | ORAL_TABLET | ORAL | Status: DC | PRN
  Administered 2023-05-28: 1 mg via ORAL
  Filled 2023-05-28: qty 1

## 2023-05-28 MED ORDER — HYDROMORPHONE HCL 1 MG/ML IJ SOLN
1.0000 mg | Freq: Once | INTRAMUSCULAR | Status: AC
  Administered 2023-05-28: 1 mg via INTRAVENOUS
  Filled 2023-05-28: qty 1

## 2023-05-28 MED ORDER — LACTATED RINGERS IV SOLN: INTRAVENOUS | Status: AC

## 2023-05-28 MED ORDER — ADULT MULTIVITAMIN W/MINERALS CH
1.0000 | ORAL_TABLET | Freq: Every day | ORAL | Status: DC
  Administered 2023-05-28 – 2023-05-31 (×4): 1 via ORAL
  Filled 2023-05-28 (×4): qty 1

## 2023-05-28 MED ORDER — ONDANSETRON HCL 4 MG/2ML IJ SOLN
4.0000 mg | Freq: Four times a day (QID) | INTRAMUSCULAR | Status: DC | PRN
  Administered 2023-05-28 – 2023-06-01 (×14): 4 mg via INTRAVENOUS
  Filled 2023-05-28 (×14): qty 2

## 2023-05-28 MED ORDER — LORAZEPAM 2 MG/ML IJ SOLN
1.0000 mg | INTRAMUSCULAR | Status: DC | PRN
  Administered 2023-05-28 – 2023-05-29 (×3): 1 mg via INTRAVENOUS
  Filled 2023-05-28 (×3): qty 1

## 2023-05-28 MED ORDER — ENOXAPARIN SODIUM 40 MG/0.4ML IJ SOSY
40.0000 mg | PREFILLED_SYRINGE | INTRAMUSCULAR | Status: DC
Start: 2023-05-28 — End: 2023-06-02
  Administered 2023-05-29 – 2023-06-02 (×5): 40 mg via SUBCUTANEOUS
  Filled 2023-05-28 (×5): qty 0.4

## 2023-05-28 MED ORDER — ACETAMINOPHEN 650 MG RE SUPP: 650.0000 mg | Freq: Four times a day (QID) | RECTAL | Status: DC | PRN

## 2023-05-28 NOTE — TOC CM/SW Note (Signed)
 Transition of Care [facility]) - Inpatient Brief Assessment   Patient Details  Name: Bryan Edwards MRN: [id number] Date of Birth: 09/02/1995  Transition of Care [facility]) CM/SW Contact:    Villa Herb, LCSWA Phone Number: 05/28/2023, 10:23 AM   Clinical Narrative: [facility] consulted for substance use resources. Resources have been added to pts AVS for review once medically stable for D/C.   Transition of Care Department [facility]) has reviewed patient and no TOC needs have been identified at this time. We will continue to monitor patient advancement through interdiciplinary progression rounds. If new patient transition needs arise, please place a TOC consult.   Transition of Care Asessment: Insurance and Status: Insurance coverage has been reviewed Patient has primary care physician: Yes Home environment has been reviewed: from home Prior level of function:: independent Prior/Current Home Services: No current home services Social Drivers of Health Review: SDOH reviewed no interventions necessary Readmission risk has been reviewed: Yes Transition of care needs: no transition of care needs at this time

## 2023-05-28 NOTE — Plan of Care (Signed)
   Problem: Activity: Goal: Risk for activity intolerance will decrease Outcome: Progressing   Problem: Coping: Goal: Level of anxiety will decrease Outcome: Progressing

## 2023-05-28 NOTE — Progress Notes (Signed)
 In with [place] for skin assessment. Pt a/o. Tremors noted. C/o some nausea and pain. Will perform CIWA. Pt able to ambulate. Nad at this time. Able to hold down water at this time.

## 2023-05-28 NOTE — H&P (Addendum)
 History and Physical    Patient: Bryan Edwards ZOX:[id number] DOB: 1996-01-18 DOA: 05/27/2023 DOS: the patient was seen and examined on 05/28/2023 PCP: Pcp, No  Patient coming from: Home  Chief Complaint:  Chief Complaint  Patient presents with   Abdominal Pain   HPI: Bryan Edwards is a 28 y.o. male with medical history significant of  heavy alcohol abuse, tobacco abuse who presents to the emergency department due to 1 day onset of epigastric abdominal pain with radiation to the back including the thoracic back.  Pain was constant, though similar, was worse than prior episodes of pancreatitis.  He endorsed drinking both liquor and beer and last consumption was < 2 days ago Patient states that he abstained from alcohol consumption since last time he was admitted and discharged from the hospital (2/27 -3/2).  He denies fever, chills, nausea, vomiting.  ED Course:  In the emergency department, BP was 138/98, other vital signs were within normal range.  Workup in the ED showed normal CBC except for WBC of 10.9, BMP was normal except for sodium of 134, potassium 3.2, chloride 93, blood glucose 101, anion gap 17, lipase 208. Patient was treated with Dilaudid and IV hydration, IV Zofran 4 mg x 1 was given. Hospitalist was asked to admit patient for further evaluation and management.  Review of Systems: Review of systems as noted in the HPI. All other systems reviewed and are negative.   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 No pertinent surgical history.  Social History:  reports that he has been smoking cigarettes. He does not have any smokeless tobacco history on file. He reports current alcohol use. He reports that he does not use drugs.   Allergies  Allergen Reactions   Amoxicillin Other (See Comments)    Makes tongue and throat hurt, dries it out   Bee Pollen Other (See Comments)     Seasonal allergies   Penicillins Swelling    History reviewed. No pertinent family history.   Prior to Admission medications   Medication Sig Start Date End Date Taking? Authorizing Provider  Multiple Vitamin (MULTIVITAMIN) tablet Take 1 tablet by mouth daily.    [provider]  ondansetron (ZOFRAN) 4 MG tablet Take 1 tablet (4 mg total) by mouth every 6 (six) hours as needed for nausea or vomiting. 04/19/23   Carollee Herter, DO  pantoprazole (PROTONIX) 40 MG tablet Take 1 tablet (40 mg total) by mouth 2 (two) times daily. 04/19/23 05/19/23  Carollee Herter, DO    Physical Exam: BP (!) 151/99   Pulse 87   Temp 99 F (37.2 C) (Oral)   Resp 20   Ht 5\' 10"  (1.778 m)   Wt 67.8 kg   SpO2 96%   BMI 21.45 kg/m   General: 28 y.o. year-old male.  Appearing, but in no acute distress.  Alert and oriented x3. HEENT: NCAT, EOMI Neck: Supple, trachea medial Cardiovascular: Regular rate and rhythm with no rubs or gallops.  No thyromegaly or JVD noted.  No lower extremity edema. 2/4 pulses in all 4 extremities. Respiratory: Clear to auscultation with no wheezes or rales. Good inspiratory effort. Abdomen: Soft, tender to palpation of epigastric area without guarding.  Nondistended with normal bowel sounds x4 quadrants. Muskuloskeletal: No cyanosis, clubbing or edema noted bilaterally Neuro: Bilateral hand tremors noted on outstretched hands.  CN II-XII intact, sensation, reflexes  intact Skin: No ulcerative lesions noted or rashes Psychiatry: Judgement and insight appear normal. Mood is appropriate for condition and setting          Labs on Admission:  Basic Metabolic Panel: Recent Labs  Lab 05/27/23 2246  NA 134*  K 3.2*  CL 93*  CO2 24  GLUCOSE 101*  BUN 11  CREATININE 0.67  CALCIUM 9.4   Liver Function Tests: Recent Labs  Lab 05/27/23 2246  AST 32  ALT 25  ALKPHOS 88  BILITOT 0.9  PROT 7.4  ALBUMIN 4.8   Recent Labs  Lab 05/27/23 2246  LIPASE 208*   No results for  input(s): "AMMONIA" in the last 168 hours. CBC: Recent Labs  Lab 05/27/23 2246  WBC 10.9*  HGB 16.7  HCT 46.6  MCV 86.3  PLT 218   Cardiac Enzymes: No results for input(s): "CKTOTAL", "CKMB", "CKMBINDEX", "TROPONINI" in the last 168 hours.  BNP (last 3 results) No results for input(s): "BNP" in the last 8760 hours.  ProBNP (last 3 results) No results for input(s): "PROBNP" in the last 8760 hours.  CBG: No results for input(s): "GLUCAP" in the last 168 hours.  Radiological Exams on Admission: No results found.  EKG: I independently viewed the EKG done and my findings are as followed: EKG was not done in the ED  Assessment/Plan Present on Admission:  Acute recurrent pancreatitis  Alcohol withdrawal (HCC)  Tobacco abuse  Alcohol abuse  Principal Problem:   Acute recurrent pancreatitis Active Problems:   Alcohol abuse   Alcohol withdrawal (HCC)   Tobacco abuse   Hypokalemia  Acute recurrent pancreatitis CT abdomen and pelvis with contrast showed Acute edematous pancreatitis without collection.  Lipase was elevated at 208 Continue IV Zofran p.r.n Continue IV Dilaudid 0.5 mg every 3 hours p.r.n for pain Continue Protonix Continue IV LR at 172ml/Hr RUQ U/S in the morning to investigate biliary etiology (gallstone and bile duct dilatation  Hypokalemia K+ 3.2, this will be replenished  Alcohol abuse/withdrawal Patient was counseled on alcohol abuse cessation Continue CIWA protocol  Tobacco abuse Patient was counseled on tobacco abuse cessation   DVT prophylaxis: Lovenox  Code Status: Full code  Family Communication: None at bedside  Consults: None  Severity of Illness: The appropriate patient status for this patient is INPATIENT. Inpatient status is judged to be reasonable and necessary in order to provide the required intensity of service to ensure the patient's safety. The patient's presenting symptoms, physical exam findings, and initial radiographic  and laboratory data in the context of their chronic comorbidities is felt to place them at high risk for further clinical deterioration. Furthermore, it is not anticipated that the patient will be medically stable for discharge from the hospital within 2 midnights of admission.   * I certify that at the point of admission it is my clinical judgment that the patient will require inpatient hospital care spanning beyond 2 midnights from the point of admission due to high intensity of service, high risk for further deterioration and high frequency of surveillance required.*  Author: Frankey Shown, DO 05/28/2023 2:28 AM  For on call review [email or web address].

## 2023-05-28 NOTE — Progress Notes (Addendum)
 Pt has been resting well since admission. Called out once for pain and nausea meds. Pt has not been trembling today as he was when first admitted and received the ativan. Pt has been drinking the fluids with nausea but has not vomited since admitted. Nad. Pt has urinated and ambulates to bathroom without difficulty.

## 2023-05-28 NOTE — Plan of Care (Signed)

## 2023-05-28 NOTE — Progress Notes (Signed)
 Patient seen and examined; admitted after midnight secondary to abdominal pain radiated to his back and associated nausea.  Patient reports symptoms very similar to prior hospitalization secondary to pancreatitis.  Patient have a history of alcohol abuse, tobacco abuse, anxiety and gastroesophageal reflux disease.  Stable vital signs; no fever, blood work demonstrating mild hypokalemia and borderline elevation of WBCs.  Images with positive acute edematous changes surrounding the pancreas without any collection or pseudocyst formation.  Lipase at time of admission 208; normal LFTs appreciated.  Please refer to H&P written by Dr. Thomes Dinning on 05/28/2023 for further info/details on admission.  Plan: -Continue bowel rest with n.p.o. status -Continue fluid resuscitation and supportive care -Follow electrolytes and replete as needed -Alcohol cessation has been provided; CIWA protocol in place.  Will also continue daily folic acid and thiamine. -Continue analgesics and as needed antiemetics; advance diet as tolerated based on patient's clinical response. -In order to be thorough we will follow results of ordered right upper quadrant ultrasound to rule out any potential component of gallstones and/or biliary dilatation. -continue PPI  Vassie Loll MD [phone]

## 2023-05-29 DIAGNOSIS — F1010 Alcohol abuse, uncomplicated: Secondary | ICD-10-CM

## 2023-05-29 DIAGNOSIS — K859 Acute pancreatitis without necrosis or infection, unspecified: Secondary | ICD-10-CM | POA: Diagnosis not present

## 2023-05-29 DIAGNOSIS — E876 Hypokalemia: Secondary | ICD-10-CM | POA: Diagnosis not present

## 2023-05-29 DIAGNOSIS — Z720 Tobacco use: Secondary | ICD-10-CM

## 2023-05-29 LAB — CBC
HCT: 47.3 % (ref 39.0–52.0)
Hemoglobin: 16 g/dL (ref 13.0–17.0)
MCH: 30 pg (ref 26.0–34.0)
MCHC: 33.8 g/dL (ref 30.0–36.0)
MCV: 88.7 fL (ref 80.0–100.0)
Platelets: 130 10*3/uL — ABNORMAL LOW (ref 150–400)
RBC: 5.33 MIL/uL (ref 4.22–5.81)
RDW: 13.1 % (ref 11.5–15.5)
WBC: 6.4 10*3/uL (ref 4.0–10.5)
nRBC: 0 % (ref 0.0–0.2)

## 2023-05-29 LAB — BASIC METABOLIC PANEL WITH GFR
Anion gap: 8 (ref 5–15)
BUN: 6 mg/dL (ref 6–20)
CO2: 31 mmol/L (ref 22–32)
Calcium: 9.5 mg/dL (ref 8.9–10.3)
Chloride: 96 mmol/L — ABNORMAL LOW (ref 98–111)
Creatinine, Ser: 0.8 mg/dL (ref 0.61–1.24)
GFR, Estimated: >60 mL/min (ref >60)
Glucose, Bld: 93 mg/dL (ref 70–99)
Potassium: 3.3 mmol/L — ABNORMAL LOW (ref 3.5–5.1)
Sodium: 135 mmol/L (ref 135–145)

## 2023-05-29 MED ORDER — POTASSIUM CHLORIDE CRYS ER 20 MEQ PO TBCR
40.0000 meq | EXTENDED_RELEASE_TABLET | ORAL | Status: AC
Start: 2023-05-29 — End: 2023-05-29
  Administered 2023-05-29 (×2): 40 meq via ORAL
  Filled 2023-05-29 (×2): qty 2

## 2023-05-29 MED ORDER — NICOTINE 21 MG/24HR TD PT24
21.0000 mg | MEDICATED_PATCH | Freq: Every day | TRANSDERMAL | Status: DC
  Administered 2023-05-29 – 2023-06-02 (×5): 21 mg via TRANSDERMAL
  Filled 2023-05-29 (×5): qty 1

## 2023-05-29 MED ORDER — LORAZEPAM 1 MG PO TABS
1.0000 mg | ORAL_TABLET | ORAL | Status: DC | PRN
  Administered 2023-05-29 – 2023-06-02 (×12): 1 mg via ORAL
  Filled 2023-05-29 (×12): qty 1

## 2023-05-29 NOTE — Progress Notes (Addendum)
  Progress Note   Patient: Bryan Edwards [id number]:[id number] DOB: [birth year] DOA: 05/27/2023     1 DOS: the patient was seen and examined on 05/29/2023   Brief hospital course: Bryan Edwards was admitted to the hospital with the working diagnosis of acute pancreatitis.   28 yo male who presented with abdominal pain radiated to his back and associated nausea.  Patient reports symptoms very similar to prior hospitalization secondary to pancreatitis.  Patient have a history of alcohol abuse, tobacco abuse, anxiety and gastroesophageal reflux disease.  Stable vital signs; no fever, blood work demonstrating mild hypokalemia and borderline elevation of WBCs.  Images with positive acute edematous changes surrounding the pancreas without any collection or pseudocyst formation.  Lipase at time of admission 208; normal LFTs appreciated.   Assessment and Plan: * Acute recurrent pancreatitis Abdominal pain has improved but not yet back to baseline   Plan to continue bowel rest with clear liquids.  Pain control with hydromorphone and oxycodone.  Antiacid therapy and as needed antiemetics.   Hypokalemia Renal function with serum cr at 0,80 with K at 3,3 and serum bicarbonate at 31  Na 135   Plan to continue K correction with Kcl 40 meq x 2  Follow up renal function and electrolytes   Alcohol abuse No signs of acute withdrawal.  Plan to continue with as needed lorazepam, hold on CIWA protocol.  Neuro checks per unit protocol.   Tobacco abuse Smoking cessation counseling         Subjective: Patient continue to have abdominal pain, tolerating clears, but continue to have nausea. No vomiting.   Physical Exam: Vitals:   05/28/23 2116 05/29/23 0326 05/29/23 0845 05/29/23 1407  BP: (!) 141/99 (!) 139/110 (!) 152/125 (!) 135/96  Pulse: 65 91 89 85  Resp: 20 20  18   Temp: 98.4 F (36.9 C) 97.7 F (36.5 C)  98.2 F (36.8 C)  TempSrc:  Oral  Oral  SpO2: 94% 98%  99%  Weight:      Height:        Neurology awake and alert ENT with mild pallor Cardiovascular with S1 and S2 present and regular with no gallops, rubs or murmurs Respiratory with no rales or wheezing, no rhonchi  Abdomen with no distention  No lower extremity edema  Data Reviewed:    Family Communication: no family at the bedside  Disposition: Status is: Inpatient Remains inpatient appropriate because: resolving pancreatitis   Planned Discharge Destination: Home    Author: Coralie Keens, MD 05/29/2023 3:24 PM  For on call review [email or web address].

## 2023-05-29 NOTE — Plan of Care (Signed)

## 2023-05-29 NOTE — Assessment & Plan Note (Addendum)
 Tolerating well soft diet.   Pain control with hydromorphone, will decrease dose to 0,5 mg and change frequency to as needed every 8 hrs.  Continue with as needed oxycodone every 4 hrs. Antiacid therapy and as needed antiemetics.

## 2023-05-29 NOTE — Assessment & Plan Note (Addendum)
 No signs of acute withdrawal.  Plan to continue with as needed lorazepam. Neuro checks per unit protocol.

## 2023-05-29 NOTE — Plan of Care (Signed)
  Problem: Activity: Goal: Risk for activity intolerance will decrease Outcome: Progressing   Problem: Coping: Goal: Level of anxiety will decrease Outcome: Progressing   Problem: Activity: Goal: Risk for activity intolerance will decrease Outcome: Progressing   Problem: Coping: Goal: Level of anxiety will decrease Outcome: Progressing

## 2023-05-29 NOTE — Hospital Course (Addendum)
 Bryan Edwards was admitted to the hospital with the working diagnosis of acute pancreatitis.   28 yo male who presented with abdominal pain radiated to his back and associated nausea.  Patient reports symptoms very similar to prior hospitalization secondary to pancreatitis.  Patient have a history of alcohol abuse, tobacco abuse, anxiety and gastroesophageal reflux disease.   On his initial physical examination his blood pressure was 151/99, HR 87, RR 20 and 02 saturation 96% Lungs with no wheezing or rales, heart with S1 and S2 present and regular with no rubs, abdomen with no distention, tender to palpation in the epigastrium, with no rebound or guarding, no lower extremity edema   Na 134, K 3,2 CL 93, bicarbonate 24 glucose 101 bun 11 cr 0,67  AST 32 ALT 25 lipase 208  Wbc 10.9 hgb 16,7 plt 218  Urine analysis SG  1,013 protein 30, negative leukocytes and negative hgb,  Alcohol level < 10   CT of the abdomen with acute edematous pancreatitis without collection  Right upper quadrant ultrasound with dilated gallbladder with no stones.  Slight ectasia of the common duct.   Patient was placed on supportive medical therapy including IV fluids, IV analgesics and bowel rest.   04/13 progressively improving, plan for possible discharge home in 24 hrs.  04/14 decreased frequency of IV analgesics.  04/15 symptoms continue to improve, patient will continue with soft diet at home along with as needed analgesics and antiemetics.  Follow up as outpatient.

## 2023-05-29 NOTE — Assessment & Plan Note (Addendum)
 Renal function stable with serum cr at 0,64 with K at 4,1 and serum bicarbonate at 24 Na 136   Electrolytes have been corrected and patient is tolerating soft diet well.

## 2023-05-29 NOTE — Assessment & Plan Note (Addendum)
 Smoking cessation counseling  Nicotine patch.

## 2023-05-30 DIAGNOSIS — F1010 Alcohol abuse, uncomplicated: Secondary | ICD-10-CM | POA: Diagnosis not present

## 2023-05-30 DIAGNOSIS — Z720 Tobacco use: Secondary | ICD-10-CM | POA: Diagnosis not present

## 2023-05-30 DIAGNOSIS — E876 Hypokalemia: Secondary | ICD-10-CM | POA: Diagnosis not present

## 2023-05-30 DIAGNOSIS — K859 Acute pancreatitis without necrosis or infection, unspecified: Secondary | ICD-10-CM | POA: Diagnosis not present

## 2023-05-30 LAB — BASIC METABOLIC PANEL WITH GFR
Anion gap: 12 (ref 5–15)
BUN: 7 mg/dL (ref 6–20)
CO2: 26 mmol/L (ref 22–32)
Calcium: 9.9 mg/dL (ref 8.9–10.3)
Chloride: 99 mmol/L (ref 98–111)
Creatinine, Ser: 0.71 mg/dL (ref 0.61–1.24)
GFR, Estimated: >60 mL/min (ref >60)
Glucose, Bld: 80 mg/dL (ref 70–99)
Potassium: 4.1 mmol/L (ref 3.5–5.1)
Sodium: 137 mmol/L (ref 135–145)

## 2023-05-30 LAB — MAGNESIUM: Magnesium: 2.1 mg/dL (ref 1.7–2.4)

## 2023-05-30 NOTE — Progress Notes (Signed)
  Progress Note   Patient: Bryan Edwards [id number]:[id number] DOB: [birth year] DOA: 05/27/2023     2 DOS: the patient was seen and examined on 05/30/2023   Brief hospital course: Mr. Conde was admitted to the hospital with the working diagnosis of acute pancreatitis.   28 yo male who presented with abdominal pain radiated to his back and associated nausea.  Patient reports symptoms very similar to prior hospitalization secondary to pancreatitis.  Patient have a history of alcohol abuse, tobacco abuse, anxiety and gastroesophageal reflux disease.  Stable vital signs; no fever, blood work demonstrating mild hypokalemia and borderline elevation of WBCs.  Images with positive acute edematous changes surrounding the pancreas without any collection or pseudocyst formation.  Lipase at time of admission 208; normal LFTs appreciated.   Assessment and Plan: * Acute recurrent pancreatitis Abdominal pain continue to improve.   Advance diet to soft.   Pain control with hydromorphone and oxycodone.  Antiacid therapy and as needed antiemetics.   Hypokalemia Patient tolerating clear liquids well, renal function today with serum cr at 0,71 with K at 4.1  Na 137 and Mg 2,1   Off IV fluids, will advance diet to soft.  Follow electrolytes in am.   Alcohol abuse No signs of acute withdrawal.  Plan to continue with as needed lorazepam. Neuro checks per unit protocol.   Tobacco abuse Smoking cessation counseling        Subjective: Patient is feeling better, abdominal pain is improving, but not back to baseline, this morning had nausea. Tolerating well clears.   Physical Exam: Vitals:   05/29/23 2109 05/30/23 0456 05/30/23 0854 05/30/23 0902  BP: (!) 132/102 (!) 138/106 (!) 132/112 (!) 132/112  Pulse: 86 85 95   Resp: 20 19    Temp: 98.2 F (36.8 C) (!) 97.4 F (36.3 C)    TempSrc: Oral Oral    SpO2: 100% 100%    Weight:      Height:       Neurology awake and alert ENT with mild pallor with no  icterus Cardiovascular with S1 and S2 present and regular with no gallops, rubs or murmurs Respiratory with no rales or wheezing, no rhonchi  Abdomen with no distention  No lower extremity edema  Data Reviewed:    Family Communication: no family at the bedside   Disposition: Status is: Inpatient Remains inpatient appropriate because: recovering pancreatitis   Planned Discharge Destination: Home     Author: Albertus Alt, MD 05/30/2023 1:14 PM  For on call review [email or web address].

## 2023-05-30 NOTE — Plan of Care (Signed)

## 2023-05-31 DIAGNOSIS — K859 Acute pancreatitis without necrosis or infection, unspecified: Secondary | ICD-10-CM | POA: Diagnosis not present

## 2023-05-31 DIAGNOSIS — F1010 Alcohol abuse, uncomplicated: Secondary | ICD-10-CM | POA: Diagnosis not present

## 2023-05-31 DIAGNOSIS — Z720 Tobacco use: Secondary | ICD-10-CM | POA: Diagnosis not present

## 2023-05-31 DIAGNOSIS — E876 Hypokalemia: Secondary | ICD-10-CM | POA: Diagnosis not present

## 2023-05-31 LAB — CBC
HCT: 47.1 % (ref 39.0–52.0)
Hemoglobin: 16.1 g/dL (ref 13.0–17.0)
MCH: 29.9 pg (ref 26.0–34.0)
MCHC: 34.2 g/dL (ref 30.0–36.0)
MCV: 87.4 fL (ref 80.0–100.0)
Platelets: 130 10*3/uL — ABNORMAL LOW (ref 150–400)
RBC: 5.39 MIL/uL (ref 4.22–5.81)
RDW: 12.9 % (ref 11.5–15.5)
WBC: 5.7 10*3/uL (ref 4.0–10.5)
nRBC: 0 % (ref 0.0–0.2)

## 2023-05-31 LAB — BASIC METABOLIC PANEL WITH GFR
Anion gap: 13 (ref 5–15)
BUN: 9 mg/dL (ref 6–20)
CO2: 24 mmol/L (ref 22–32)
Calcium: 9.9 mg/dL (ref 8.9–10.3)
Chloride: 99 mmol/L (ref 98–111)
Creatinine, Ser: 0.64 mg/dL (ref 0.61–1.24)
GFR, Estimated: >60 mL/min (ref >60)
Glucose, Bld: 92 mg/dL (ref 70–99)
Potassium: 4.1 mmol/L (ref 3.5–5.1)
Sodium: 136 mmol/L (ref 135–145)

## 2023-05-31 MED ORDER — PANTOPRAZOLE SODIUM 40 MG PO TBEC
40.0000 mg | DELAYED_RELEASE_TABLET | Freq: Every day | ORAL | Status: DC
  Administered 2023-06-01 – 2023-06-02 (×2): 40 mg via ORAL
  Filled 2023-05-31 (×2): qty 1

## 2023-05-31 MED ORDER — HYDROMORPHONE HCL 1 MG/ML IJ SOLN
0.5000 mg | Freq: Three times a day (TID) | INTRAMUSCULAR | Status: DC | PRN
  Administered 2023-05-31 – 2023-06-02 (×5): 0.5 mg via INTRAVENOUS
  Filled 2023-05-31 (×5): qty 0.5

## 2023-05-31 MED ORDER — OXYCODONE HCL 5 MG PO TABS
5.0000 mg | ORAL_TABLET | ORAL | Status: DC | PRN
  Administered 2023-05-31 – 2023-06-01 (×5): 5 mg via ORAL
  Filled 2023-05-31 (×7): qty 1

## 2023-05-31 MED ORDER — HYDROMORPHONE HCL 1 MG/ML IJ SOLN: 0.5000 mg | INTRAMUSCULAR | Status: DC | PRN

## 2023-05-31 NOTE — Plan of Care (Signed)

## 2023-05-31 NOTE — Progress Notes (Signed)
  Progress Note   Patient: Bryan Edwards [id number]:[id number] DOB: [birth year] DOA: 05/27/2023     3 DOS: the patient was seen and examined on 05/31/2023   Brief hospital course: Mr. Bryan Edwards was admitted to the hospital with the working diagnosis of acute pancreatitis.   28 yo male who presented with abdominal pain radiated to his back and associated nausea.  Patient reports symptoms very similar to prior hospitalization secondary to pancreatitis.  Patient have a history of alcohol abuse, tobacco abuse, anxiety and gastroesophageal reflux disease.   On his initial physical examination his blood pressure was 151/99, HR 87, RR 20 and 02 saturation 96% Lungs with no wheezing or rales, heart with S1 and S2 present and regular with no rubs, abdomen with no distention, tender to palpation in the epigastrium, with no rebound or guarding, no lower extremity edema   Na 134, K 3,2 CL 93, bicarbonate 24 glucose 101 bun 11 cr 0,67  AST 32 Edwards 25 lipase 208  Wbc 10.9 hgb 16,7 plt 218  Urine analysis SG  1,013 protein 30, negative leukocytes and negative hgb,  Alcohol level < 10   CT of the abdomen with acute edematous pancreatitis without collection  Right upper quadrant ultrasound with dilated gallbladder with no stones.  Slight ectasia of the common duct.   Patient was placed on supportive medical therapy including IV fluids, IV analgesics and bowel rest.   04/13 progressively improving, plan for possible discharge home in 24 hrs.    Assessment and Plan: * Acute recurrent pancreatitis Tolerating well soft diet.   Pain control with hydromorphone, will decrease dose to 0,5 mg and change frequency to as needed every 8 hrs.  Continue with as needed oxycodone every 4 hrs. Antiacid therapy and as needed antiemetics.   Hypokalemia Renal function stable with serum cr at 0,64 with K at 4,1 and serum bicarbonate at 24 Na 136   Electrolytes have been corrected and patient is tolerating soft diet well.     Alcohol abuse No signs of acute withdrawal.  Plan to continue with as needed lorazepam (yesterday he required total of 3 mg). Neuro checks per unit protocol.   Tobacco abuse Smoking cessation counseling  Nicotine patch.       Subjective: patient is feeling better, abdominal pain continue to improve, continue to have nausea, but not vomiting since yesterday   Physical Exam: Vitals:   05/30/23 1608 05/30/23 2207 05/31/23 0509 05/31/23 1322  BP: (!) 138/115 (!) 135/103 (!) 127/93 110/86  Pulse:  86 88 95  Resp:  20 18 20   Temp:  97.8 F (36.6 C) (!) 97.5 F (36.4 C) 97.7 F (36.5 C)  TempSrc:  Oral Oral Oral  SpO2:  99% 99% 98%  Weight:      Height:       Neurology awake and alert ENT with mild pallor with no icterus Cardiovascular with S1 and S2 present and regular with no gallops, rubs or murmurs Respiratory with no rales or wheezing,  Abdomen with no distention, mild tender to palpation, with no rebound or guarding  No lower extremity edema  Data Reviewed:    Family Communication: family/ friend at the bedside   Disposition: Status is: Inpatient Remains inpatient appropriate because: possible discharge home tomorrow   Planned Discharge Destination: Home     Author: Albertus Alt, MD 05/31/2023 2:06 PM  For on call review [email or web address].

## 2023-06-01 DIAGNOSIS — E876 Hypokalemia: Secondary | ICD-10-CM | POA: Diagnosis not present

## 2023-06-01 DIAGNOSIS — Z720 Tobacco use: Secondary | ICD-10-CM | POA: Diagnosis not present

## 2023-06-01 DIAGNOSIS — K859 Acute pancreatitis without necrosis or infection, unspecified: Secondary | ICD-10-CM | POA: Diagnosis not present

## 2023-06-01 DIAGNOSIS — F1010 Alcohol abuse, uncomplicated: Secondary | ICD-10-CM | POA: Diagnosis not present

## 2023-06-01 MED ORDER — BISACODYL 5 MG PO TBEC
5.0000 mg | DELAYED_RELEASE_TABLET | Freq: Once | ORAL | Status: AC
  Administered 2023-06-01: 5 mg via ORAL
  Filled 2023-06-01: qty 1

## 2023-06-01 MED ORDER — POLYETHYLENE GLYCOL 3350 17 G PO PACK
17.0000 g | PACK | Freq: Every day | ORAL | Status: DC | PRN
  Administered 2023-06-01: 17 g via ORAL
  Filled 2023-06-01: qty 1

## 2023-06-01 NOTE — Plan of Care (Signed)

## 2023-06-01 NOTE — Progress Notes (Signed)
 Educated pt to walk the halls. He has made several laps around the unit.

## 2023-06-01 NOTE — Progress Notes (Signed)
  Progress Note   Patient: Bryan Edwards ZOX:[id number] DOB: March 07, 1995 DOA: 05/27/2023     4 DOS: the patient was seen and examined on 06/01/2023   Brief hospital course: Bryan Edwards was admitted to the hospital with the working diagnosis of acute pancreatitis.   28 yo male who presented with abdominal pain radiated to his back and associated nausea.  Patient reports symptoms very similar to prior hospitalization secondary to pancreatitis.  Patient have a history of alcohol abuse, tobacco abuse, anxiety and gastroesophageal reflux disease.   On his initial physical examination his blood pressure was 151/99, HR 87, RR 20 and 02 saturation 96% Lungs with no wheezing or rales, heart with S1 and S2 present and regular with no rubs, abdomen with no distention, tender to palpation in the epigastrium, with no rebound or guarding, no lower extremity edema   Na 134, K 3,2 CL 93, bicarbonate 24 glucose 101 bun 11 cr 0,67  AST 32 ALT 25 lipase 208  Wbc 10.9 hgb 16,7 plt 218  Urine analysis SG  1,013 protein 30, negative leukocytes and negative hgb,  Alcohol level < 10   CT of the abdomen with acute edematous pancreatitis without collection  Right upper quadrant ultrasound with dilated gallbladder with no stones.  Slight ectasia of the common duct.   Patient was placed on supportive medical therapy including IV fluids, IV analgesics and bowel rest.   04/13 progressively improving, plan for possible discharge home in 24 hrs.  04/14 decreased frequency of IV analgesics.    Assessment and Plan: * Acute recurrent pancreatitis Tolerating well soft diet.   Pain control with hydromorphone as needed every 8 hrs for severe pain.  Continue with as needed oxycodone every 4 hrs. Antiacid therapy and as needed antiemetics.  Add dulcolax for constipation.  Off IV fluids.   Hypokalemia Renal function stable with serum cr at 0,64 with K at 4,1 and serum bicarbonate at 24 Na 136   Electrolytes have been  corrected and patient is tolerating soft diet well.    Alcohol abuse No signs of acute withdrawal.  Plan to continue with as needed lorazepam Neuro checks per unit protocol.   Tobacco abuse Smoking cessation counseling  Nicotine patch.         Subjective: patient with intermittent abdominal pain and nausea, occasional vomiting, no chest pain or dyspnea   Physical Exam: Vitals:   05/31/23 0509 05/31/23 1322 05/31/23 2150 06/01/23 0519  BP: (!) 127/93 110/86 122/88 (!) 124/103  Pulse: 88 95 65 87  Resp: 18 20 20 20   Temp: (!) 97.5 F (36.4 C) 97.7 F (36.5 C) 98.2 F (36.8 C) 97.6 F (36.4 C)  TempSrc: Oral Oral Oral Oral  SpO2: 99% 98% 97% 100%  Weight:      Height:       Neurology awake and alert ENT with mild pallor with no icterus Cardiovascular with S1 and S2 present and regular with no gallops, rubs or murmurs Respiratory with no rales or wheezing, no rhonchi  Abdomen with no distention, tender to superficial palpation with no rebound or guarding  No lower extremity edema  Data Reviewed:    Family Communication: no family at the bedside   Disposition: Status is: Inpatient Remains inpatient appropriate because: recovering pancreatitis   Planned Discharge Destination: Home     Author: Coralie Keens, MD 06/01/2023 12:10 PM  For on call review [email or web address].

## 2023-06-02 DIAGNOSIS — E876 Hypokalemia: Secondary | ICD-10-CM | POA: Diagnosis not present

## 2023-06-02 DIAGNOSIS — K859 Acute pancreatitis without necrosis or infection, unspecified: Secondary | ICD-10-CM | POA: Diagnosis not present

## 2023-06-02 DIAGNOSIS — Z720 Tobacco use: Secondary | ICD-10-CM | POA: Diagnosis not present

## 2023-06-02 DIAGNOSIS — F1010 Alcohol abuse, uncomplicated: Secondary | ICD-10-CM | POA: Diagnosis not present

## 2023-06-02 MED ORDER — ONDANSETRON HCL 4 MG PO TABS: 4.0000 mg | ORAL_TABLET | Freq: Three times a day (TID) | ORAL | 0 refills | Status: DC | PRN

## 2023-06-02 MED ORDER — ACETAMINOPHEN 325 MG PO TABS: 650.0000 mg | ORAL_TABLET | Freq: Four times a day (QID) | ORAL | Status: DC | PRN

## 2023-06-02 MED ORDER — TRAMADOL HCL 50 MG PO TABS: 50.0000 mg | ORAL_TABLET | Freq: Three times a day (TID) | ORAL | 0 refills | Status: DC | PRN

## 2023-06-02 MED ORDER — PANTOPRAZOLE SODIUM 40 MG PO TBEC: 40.0000 mg | DELAYED_RELEASE_TABLET | Freq: Every day | ORAL | 0 refills | Status: DC

## 2023-06-02 MED ORDER — TRAMADOL HCL 50 MG PO TABS: 50.0000 mg | ORAL_TABLET | Freq: Three times a day (TID) | ORAL | Status: DC | PRN

## 2023-06-02 NOTE — Plan of Care (Signed)
  Problem: Education: Goal: Knowledge of General Education information will improve Description: Including pain rating scale, medication(s)/side effects and non-pharmacologic comfort measures Outcome: Progressing   Problem: Clinical Measurements: Goal: Ability to maintain clinical measurements within normal limits will improve Outcome: Progressing Goal: Will remain free from infection Outcome: Progressing Goal: Diagnostic test results will improve Outcome: Progressing Goal: Cardiovascular complication will be avoided Outcome: Progressing   Problem: Activity: Goal: Risk for activity intolerance will decrease Outcome: Progressing   Problem: Coping: Goal: Level of anxiety will decrease Outcome: Progressing   Problem: Elimination: Goal: Will not experience complications related to bowel motility Outcome: Progressing
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
## 2023-06-13 ENCOUNTER — Emergency Department (HOSPITAL_COMMUNITY)
Admission: EM | Admit: 2023-06-13 | Discharge: 2023-06-13 | Disposition: A | Discharge status: Home / Self Care | Attending: Emergency Medicine | Admitting: Emergency Medicine

## 2023-06-13 ENCOUNTER — Other Ambulatory Visit: Payer: Self-pay

## 2023-06-13 ENCOUNTER — Encounter (HOSPITAL_COMMUNITY): Payer: Self-pay | Admitting: Emergency Medicine

## 2023-06-13 DIAGNOSIS — K852 Alcohol induced acute pancreatitis without necrosis or infection: Secondary | ICD-10-CM | POA: Insufficient documentation

## 2023-06-13 DIAGNOSIS — R1010 Upper abdominal pain, unspecified: Secondary | ICD-10-CM | POA: Diagnosis present

## 2023-06-13 LAB — CBC
HCT: 47.6 % (ref 39.0–52.0)
Hemoglobin: 16.6 g/dL (ref 13.0–17.0)
MCH: 30.9 pg (ref 26.0–34.0)
MCHC: 34.9 g/dL (ref 30.0–36.0)
MCV: 88.6 fL (ref 80.0–100.0)
Platelets: 352 10*3/uL (ref 150–400)
RBC: 5.37 MIL/uL (ref 4.22–5.81)
RDW: 13.3 % (ref 11.5–15.5)
WBC: 10.5 10*3/uL (ref 4.0–10.5)
nRBC: 0 % (ref 0.0–0.2)

## 2023-06-13 LAB — LIPASE, BLOOD: Lipase: 27 U/L (ref 11–51)

## 2023-06-13 LAB — COMPREHENSIVE METABOLIC PANEL WITH GFR
ALT: 19 U/L (ref 0–44)
AST: 22 U/L (ref 15–41)
Albumin: 4.7 g/dL (ref 3.5–5.0)
Alkaline Phosphatase: 74 U/L (ref 38–126)
Anion gap: 14 (ref 5–15)
BUN: 9 mg/dL (ref 6–20)
CO2: 25 mmol/L (ref 22–32)
Calcium: 9.8 mg/dL (ref 8.9–10.3)
Chloride: 103 mmol/L (ref 98–111)
Creatinine, Ser: 0.9 mg/dL (ref 0.61–1.24)
GFR, Estimated: >60 mL/min (ref >60)
Glucose, Bld: 87 mg/dL (ref 70–99)
Potassium: 3.6 mmol/L (ref 3.5–5.1)
Sodium: 142 mmol/L (ref 135–145)
Total Bilirubin: 0.6 mg/dL (ref 0.0–1.2)
Total Protein: 7.4 g/dL (ref 6.5–8.1)

## 2023-06-13 MED ORDER — ONDANSETRON 8 MG PO TBDP: 8.0000 mg | ORAL_TABLET | Freq: Three times a day (TID) | ORAL | 0 refills | Status: DC | PRN

## 2023-06-13 MED ORDER — HYDROCODONE-ACETAMINOPHEN 5-325 MG PO TABS: 1.0000 | ORAL_TABLET | Freq: Four times a day (QID) | ORAL | 0 refills | Status: DC | PRN

## 2023-06-13 MED ORDER — HALOPERIDOL LACTATE 5 MG/ML IJ SOLN
2.0000 mg | Freq: Once | INTRAMUSCULAR | Status: AC
  Administered 2023-06-13: 2 mg via INTRAVENOUS
  Filled 2023-06-13: qty 1

## 2023-06-13 MED ORDER — HYDROMORPHONE HCL 1 MG/ML IJ SOLN
1.0000 mg | Freq: Once | INTRAMUSCULAR | Status: AC
  Administered 2023-06-13: 1 mg via INTRAVENOUS
  Filled 2023-06-13: qty 1

## 2023-06-13 MED ORDER — ONDANSETRON HCL 4 MG/2ML IJ SOLN
4.0000 mg | Freq: Once | INTRAMUSCULAR | Status: AC
  Administered 2023-06-13: 4 mg via INTRAVENOUS
  Filled 2023-06-13: qty 2

## 2023-06-13 MED ORDER — KETOROLAC TROMETHAMINE 15 MG/ML IJ SOLN
15.0000 mg | Freq: Once | INTRAMUSCULAR | Status: AC
  Administered 2023-06-13: 15 mg via INTRAVENOUS
  Filled 2023-06-13: qty 1

## 2023-06-13 NOTE — ED Notes (Signed)
Went over d/c paperwork at this time with patient. Pt had no questions, comments or concerns after review and verbally understood them.

## 2023-06-13 NOTE — ED Provider Notes (Signed)
 [place] EMERGENCY DEPARTMENT AT [facility] Provider Note   CSN: [id number] Arrival date & time: 06/13/23  0001     History  Chief Complaint  Patient presents with   Abdominal Pain    Bryan Edwards is a 28 y.o. male.  The history is provided by the patient and a parent.  Patient with history of pancreatitis and alcohol use disorder presents with upper abdominal pain.  He reports the pain started earlier tonight is in his epigastric region and rates into his back.  He reports it is triggering shortness of breath.  No chest pain.  He reports nausea without vomiting.  No recent change in his bowel movements, no bloody or black stools.  This feels similar to prior episodes of pancreatitis.  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 Authorizing Provider  HYDROcodone-acetaminophen  (NORCO/VICODIN) 5-325 MG tablet Take 1 tablet by mouth every 6 (six) hours as needed. 06/13/23  Yes Eldon Greenland, MD  ondansetron  (ZOFRAN -ODT) 8 MG disintegrating tablet Take 1 tablet (8 mg total) by mouth every 8 (eight) hours as needed for vomiting or nausea. 06/13/23  Yes Eldon Greenland, MD  pantoprazole  (PROTONIX ) 40 MG tablet Take 1 tablet (40 mg total) by mouth daily for 15 days. 06/03/23 06/18/23  Arrien, Curlee Doss, MD      Allergies    Other, Amoxicillin, Bee pollen, and Penicillins    Review of Systems   Review of Systems  Constitutional:  Negative for fever.  Respiratory:  Positive for shortness of breath.   Cardiovascular:  Negative for chest pain.  Gastrointestinal:  Positive for abdominal pain. Negative for blood in stool and vomiting.    Physical Exam Updated Vital Signs BP 116/74   Pulse 95   Temp 97.8 F (36.6 C) (Oral)   Resp 18   Ht 1.778 m (5'  10")   Wt 66 kg   SpO2 97%   BMI 20.88 kg/m  Physical Exam CONSTITUTIONAL: Well developed/well nourished, uncomfortable appearing HEAD: Normocephalic/atraumatic EYES: EOMI/PERRL, no icterus ENMT: Mucous membranes moist NECK: supple no meningeal signs CV: S1/S2 noted, no murmurs/rubs/gallops noted LUNGS: Lungs are clear to auscultation bilaterally, no apparent distress ABDOMEN: soft, mild epigastric tenderness, no rebound or guarding, bowel sounds noted throughout abdomen GU:no cva tenderness NEURO: Pt is awake/alert/appropriate, moves all extremitiesx4.  No facial droop.   EXTREMITIES: pulses normal/equal, full ROM SKIN: warm, color normal PSYCH: Anxious ED Results / Procedures / Treatments   Labs (all labs ordered are listed, but only abnormal results are displayed) Labs Reviewed  LIPASE, BLOOD  COMPREHENSIVE METABOLIC PANEL WITH GFR  CBC  URINALYSIS, ROUTINE W REFLEX MICROSCOPIC    EKG EKG Interpretation Date/Time:  Saturday June 13 2023 00:37:24 EDT Ventricular Rate:  107 PR Interval:  159 QRS Duration:  99 QT Interval:  328 QTC Calculation: 438 R Axis:   86  Text Interpretation: Sinus tachycardia Prominent P waves, nondiagnostic Probable inferior infarct, old No significant change since last tracing Confirmed by Eldon Greenland ([id number]) on 06/13/2023 12:44:05 AM  Radiology No results found.  Procedures Procedures    Medications Ordered in ED Medications  ondansetron  (ZOFRAN ) injection 4 mg (4 mg Intravenous Given 06/13/23 0055)  ketorolac (TORADOL) 15 MG/ML injection 15 mg (15 mg Intravenous  Given 06/13/23 0055)  HYDROmorphone  (DILAUDID ) injection 1 mg (1 mg Intravenous Given 06/13/23 0055)  haloperidol lactate (HALDOL) injection 2 mg (2 mg Intravenous Given 06/13/23 0209)    ED Course/ Medical Decision Making/ A&P Clinical Course as of 06/13/23 0318  Sat Jun 13, 2023  0126 Overall labs are reassuring.  Patient is feeling improved.  Lipase is unremarkable.   We will start oral fluids [DW]  0318 Overall patient is feeling much improved.  No vomiting.  Pain is controlled. Nurse had documented episodes of hypoxia but this is likely happening when sleeping as this is not reproduced when I evaluate the patient.  Patient was sleeping off oxygen on my reassessment and his pulse ox was above 95%.  He is in no distress and no shortness of breath is reported at this time [DW]  0318 Short course of antiemetics and analgesics provided and he will be referred to gastroenterology [DW]    Clinical Course User Index [DW] Eldon Greenland, MD                                 Medical Decision Making Amount and/or Complexity of Data Reviewed Labs: ordered. ECG/medicine tests: ordered.  Risk Prescription drug management.   This patient presents to the ED for concern of abdominal pain, this involves an extensive number of treatment options, and is a complaint that carries with it a high risk of complications and morbidity.  The differential diagnosis includes but is not limited to cholecystitis, cholelithiasis, pancreatitis, gastritis, peptic ulcer disease, appendicitis, bowel obstruction, bowel perforation, diverticulitis, AAA, ischemic bowel    Comorbidities that complicate the patient evaluation: Patient's presentation is complicated by their history of pancreatitis  Social Determinants of Health: Patient's  alcohol use disorder   increases the complexity of managing their presentation  Additional history obtained: Additional history obtained from family Records reviewed previous admission documents  Lab Tests: I Ordered, and personally interpreted labs.  The pertinent results include: Labs overall reassuring  Cardiac Monitoring: The patient was maintained on a cardiac monitor.  I personally viewed and interpreted the cardiac monitor which showed an underlying rhythm of:  sinus rhythm  Medicines ordered and prescription drug management: I ordered  medication including Dilaudid  and Toradol for pain Reevaluation of the patient after these medicines showed that the patient    improved  Test Considered: I considered CT abdomen pelvis, but since he is improving with multiple imaging modalities previously, will defer at this time   Reevaluation: After the interventions noted above, I reevaluated the patient and found that they have :improved  Complexity of problems addressed: Patient's presentation is most consistent with  acute presentation with potential threat to life or bodily function  Disposition: After consideration of the diagnostic results and the patient's response to treatment,  I feel that the patent would benefit from discharge   .           Final Clinical Impression(s) / ED Diagnoses Final diagnoses:  Alcohol-induced acute pancreatitis without infection or necrosis    Rx / DC Orders ED Discharge Orders          Ordered    HYDROcodone-acetaminophen  (NORCO/VICODIN) 5-325 MG tablet  Every 6 hours PRN        06/13/23 0306    ondansetron  (ZOFRAN -ODT) 8 MG disintegrating tablet  Every 8 hours PRN        06/13/23 0306  Eldon Greenland, MD 06/13/23 [phone]

## 2023-06-13 NOTE — ED Triage Notes (Addendum)
 Pt with c/o abdominal pain that radiates to back. States he believes he is having a "pancreatitis flare". Last ETOH use was yesterday.

## 2023-06-13 NOTE — ED Notes (Signed)
Round check is complete at this time. Pt is resting with no s/s of distress.

## 2023-06-18 ENCOUNTER — Emergency Department (HOSPITAL_COMMUNITY)
Admission: EM | Admit: 2023-06-18 | Discharge: 2023-06-19 | Disposition: A | Discharge status: Home / Self Care | Attending: Emergency Medicine | Admitting: Emergency Medicine

## 2023-06-18 ENCOUNTER — Encounter (HOSPITAL_COMMUNITY): Payer: Self-pay

## 2023-06-18 ENCOUNTER — Other Ambulatory Visit: Payer: Self-pay

## 2023-06-18 DIAGNOSIS — M549 Dorsalgia, unspecified: Secondary | ICD-10-CM | POA: Diagnosis not present

## 2023-06-18 DIAGNOSIS — K2920 Alcoholic gastritis without bleeding: Secondary | ICD-10-CM | POA: Diagnosis not present

## 2023-06-18 DIAGNOSIS — F17210 Nicotine dependence, cigarettes, uncomplicated: Secondary | ICD-10-CM | POA: Diagnosis not present

## 2023-06-18 DIAGNOSIS — R1013 Epigastric pain: Secondary | ICD-10-CM | POA: Diagnosis present

## 2023-06-18 DIAGNOSIS — Y908 Blood alcohol level of 240 mg/100 ml or more: Secondary | ICD-10-CM | POA: Diagnosis not present

## 2023-06-18 LAB — CBC
HCT: 45.5 % (ref 39.0–52.0)
Hemoglobin: 16 g/dL (ref 13.0–17.0)
MCH: 31.7 pg (ref 26.0–34.0)
MCHC: 35.2 g/dL (ref 30.0–36.0)
MCV: 90.1 fL (ref 80.0–100.0)
Platelets: 260 10*3/uL (ref 150–400)
RBC: 5.05 MIL/uL (ref 4.22–5.81)
RDW: 12.8 % (ref 11.5–15.5)
WBC: 9.9 10*3/uL (ref 4.0–10.5)
nRBC: 0 % (ref 0.0–0.2)

## 2023-06-18 LAB — URINALYSIS, ROUTINE W REFLEX MICROSCOPIC
Bilirubin Urine: NEGATIVE
Glucose, UA: NEGATIVE mg/dL
Hgb urine dipstick: NEGATIVE
Ketones, ur: NEGATIVE mg/dL
Leukocytes,Ua: NEGATIVE
Nitrite: NEGATIVE
Protein, ur: NEGATIVE mg/dL
Specific Gravity, Urine: 1.002 — ABNORMAL LOW (ref 1.005–1.030)
pH: 6 (ref 5.0–8.0)

## 2023-06-18 LAB — COMPREHENSIVE METABOLIC PANEL WITH GFR
ALT: 18 U/L (ref 0–44)
AST: 23 U/L (ref 15–41)
Albumin: 4.7 g/dL (ref 3.5–5.0)
Alkaline Phosphatase: 75 U/L (ref 38–126)
Anion gap: 11 (ref 5–15)
BUN: 12 mg/dL (ref 6–20)
CO2: 23 mmol/L (ref 22–32)
Calcium: 9.5 mg/dL (ref 8.9–10.3)
Chloride: 108 mmol/L (ref 98–111)
Creatinine, Ser: 0.78 mg/dL (ref 0.61–1.24)
GFR, Estimated: >60 mL/min (ref >60)
Glucose, Bld: 95 mg/dL (ref 70–99)
Potassium: 3.5 mmol/L (ref 3.5–5.1)
Sodium: 142 mmol/L (ref 135–145)
Total Bilirubin: 0.3 mg/dL (ref 0.0–1.2)
Total Protein: 7.1 g/dL (ref 6.5–8.1)

## 2023-06-18 LAB — LIPASE, BLOOD: Lipase: 38 U/L (ref 11–51)

## 2023-06-18 NOTE — ED Triage Notes (Signed)
 Pt c/o abdominal pain. Hx of alcoholic pancreatis. Last ETOH was last night.

## 2023-06-19 ENCOUNTER — Emergency Department (HOSPITAL_COMMUNITY)
Admission: EM | Admit: 2023-06-19 | Discharge: 2023-06-20 | Disposition: A | Discharge status: Home / Self Care | Source: Home / Self Care | Attending: Emergency Medicine | Admitting: Emergency Medicine

## 2023-06-19 ENCOUNTER — Encounter (HOSPITAL_COMMUNITY): Payer: Self-pay

## 2023-06-19 DIAGNOSIS — K2920 Alcoholic gastritis without bleeding: Secondary | ICD-10-CM | POA: Insufficient documentation

## 2023-06-19 DIAGNOSIS — M549 Dorsalgia, unspecified: Secondary | ICD-10-CM | POA: Insufficient documentation

## 2023-06-19 DIAGNOSIS — Y908 Blood alcohol level of 240 mg/100 ml or more: Secondary | ICD-10-CM | POA: Insufficient documentation

## 2023-06-19 LAB — CBC WITH DIFFERENTIAL/PLATELET
Abs Immature Granulocytes: 0.02 10*3/uL (ref 0.00–0.07)
Basophils Absolute: 0.1 10*3/uL (ref 0.0–0.1)
Basophils Relative: 1 %
Eosinophils Absolute: 0.3 10*3/uL (ref 0.0–0.5)
Eosinophils Relative: 3 %
HCT: 43.1 % (ref 39.0–52.0)
Hemoglobin: 15.1 g/dL (ref 13.0–17.0)
Immature Granulocytes: 0 %
Lymphocytes Relative: 40 %
Lymphs Abs: 3.5 10*3/uL (ref 0.7–4.0)
MCH: 31.9 pg (ref 26.0–34.0)
MCHC: 35 g/dL (ref 30.0–36.0)
MCV: 90.9 fL (ref 80.0–100.0)
Monocytes Absolute: 0.6 10*3/uL (ref 0.1–1.0)
Monocytes Relative: 6 %
Neutro Abs: 4.4 10*3/uL (ref 1.7–7.7)
Neutrophils Relative %: 50 %
Platelets: 234 10*3/uL (ref 150–400)
RBC: 4.74 MIL/uL (ref 4.22–5.81)
RDW: 13.1 % (ref 11.5–15.5)
WBC: 8.9 10*3/uL (ref 4.0–10.5)
nRBC: 0 % (ref 0.0–0.2)

## 2023-06-19 LAB — ETHANOL: Alcohol, Ethyl (B): 356 mg/dL (ref <15)

## 2023-06-19 LAB — LIPASE, BLOOD: Lipase: 41 U/L (ref 11–51)

## 2023-06-19 MED ORDER — SODIUM CHLORIDE 0.9 % IV BOLUS
1000.0000 mL | Freq: Once | INTRAVENOUS | Status: AC
  Administered 2023-06-19: 1000 mL via INTRAVENOUS

## 2023-06-19 MED ORDER — ONDANSETRON HCL 4 MG/2ML IJ SOLN
4.0000 mg | Freq: Once | INTRAMUSCULAR | Status: AC
  Administered 2023-06-19: 4 mg via INTRAVENOUS
  Filled 2023-06-19: qty 2

## 2023-06-19 MED ORDER — SUCRALFATE 1 GM/10ML PO SUSP
1.0000 g | Freq: Three times a day (TID) | ORAL | Status: DC
  Filled 2023-06-19: qty 10

## 2023-06-19 MED ORDER — HYDROMORPHONE HCL 1 MG/ML IJ SOLN
1.0000 mg | Freq: Once | INTRAMUSCULAR | Status: AC
  Administered 2023-06-19: 1 mg via INTRAVENOUS
  Filled 2023-06-19: qty 1

## 2023-06-19 MED ORDER — HYDROMORPHONE HCL 1 MG/ML IJ SOLN
1.0000 mg | Freq: Once | INTRAMUSCULAR | Status: AC
  Administered 2023-06-19: 1 mg via INTRAMUSCULAR
  Filled 2023-06-19: qty 1

## 2023-06-19 NOTE — ED Provider Notes (Signed)
 [facility]-EMERGENCY DEPT [facility] Emergency Department Provider Note MRN:  [id number]  Arrival date & time: 06/19/23     Chief Complaint   Abdominal Pain   History of Present Illness   Bryan Edwards is a 28 y.o. year-old male with a history of pancreatitis presenting to the ED with chief complaint of abdominal pain.  Patient thinks his pancreatitis is back, drink alcohol again and now having epigastric pain with radiation to the back.  No fever.  No vomiting, some nausea, no diarrhea or constipation.  Review of Systems  A thorough review of systems was obtained and all systems are negative except as noted in the HPI and PMH.   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 No pertinent surgical history.  History reviewed. No pertinent family history.  Social History   Socioeconomic History   Marital status: Single    Spouse name: Not on file   Number of children: Not on file   Years of education: Not on file   Highest education level: Not on file  Occupational History   Not on file  Tobacco Use   Smoking status: Every Day    Current packs/day: 1.00    Types: Cigarettes   Smokeless tobacco: Not on file  Vaping Use   Vaping status: Never Used  Substance and Sexual Activity   Alcohol use: Yes    Comment: few times weekly   Drug use: No   Sexual activity: Not on file  Other Topics Concern   Not on file  Social History Narrative   Not on file   Social Drivers of Health   Financial Resource Strain: Not on file  Food Insecurity: No Food Insecurity (05/28/2023)   Hunger Vital Sign    Worried About Running Out of Food in the Last Year: Never true    Ran Out of Food in the Last Year: Never true  Transportation Needs: No Transportation Needs (05/28/2023)   PRAPARE - Administrator, Civil Service (Medical): No    Lack of  Transportation (Non-Medical): No  Physical Activity: Not on file  Stress: Not on file  Social Connections: Not on file  Intimate Partner Violence: Not At Risk (05/28/2023)   Humiliation, Afraid, Rape, and Kick questionnaire    Fear of Current or Ex-Partner: No    Emotionally Abused: No    Physically Abused: No    Sexually Abused: No     Physical Exam   Vitals:   06/18/23 2228 06/19/23 0200  BP: (!) 135/98 121/78  Pulse: 93 89  Resp: 18 16  Temp: 97.6 F (36.4 C) 98 F (36.7 C)  SpO2: 94% 96%    CONSTITUTIONAL: Well-appearing, NAD NEURO/PSYCH:  Alert and oriented x 3, no focal deficits EYES:  eyes equal and reactive ENT/NECK:  no LAD, no JVD CARDIO: Regular rate, well-perfused, normal S1 and S2 PULM:  CTAB no wheezing or rhonchi GI/GU:  non-distended, non-tender MSK/SPINE:  No gross deformities, no edema SKIN:  no rash, atraumatic   *Additional and/or pertinent findings included in MDM below  Diagnostic and Interventional Summary    EKG Interpretation Date/Time:    Ventricular Rate:    PR Interval:    QRS Duration:    QT Interval:    QTC Calculation:   R Axis:      Text Interpretation:  Labs Reviewed  URINALYSIS, ROUTINE W REFLEX MICROSCOPIC - Abnormal; Notable for the following components:      Result Value   Color, Urine COLORLESS (*)    Specific Gravity, Urine 1.002 (*)    All other components within normal limits  LIPASE, BLOOD  COMPREHENSIVE METABOLIC PANEL WITH GFR  CBC    No orders to display    Medications  sodium chloride  0.9 % bolus 1,000 mL (0 mLs Intravenous Stopped 06/19/23 0230)  HYDROmorphone  (DILAUDID ) injection 1 mg (1 mg Intravenous Given 06/19/23 0144)  ondansetron  (ZOFRAN ) injection 4 mg (4 mg Intravenous Given 06/19/23 0138)     Procedures  /  Critical Care Procedures  ED Course and Medical Decision Making  Initial Impression and Ddx Question of acute on chronic pancreatitis, cannot seem to stop drinking.  Vitals  reassuring, abdomen with mild to moderate epigastric tenderness.  Past medical/surgical history that increases complexity of ED encounter: Pancreatitis  Interpretation of Diagnostics I personally reviewed the Laboratory Testing and my interpretation is as follows: No significant blood count or electrolyte disturbance.    Patient Reassessment and Ultimate Disposition/Management     Patient resting comfortably for a few hours after first round of medication, no vital sign abnormalities, no indication for further testing or admission, appropriate for discharge.  Patient management required discussion with the following services or consulting groups:  None  Complexity of Problems Addressed Acute illness or injury that poses threat of life of bodily function  Additional Data Reviewed and Analyzed Further history obtained from: Recent discharge summary  Additional Factors Impacting ED Encounter Risk Consideration of hospitalization  Merrick Abe. Harless Lien, MD [facility] Emergency Medicine [facility] [email or web address]  Final Clinical Impressions(s) / ED Diagnoses     ICD-10-CM   1. Epigastric pain  R10.13       ED Discharge Orders     None        Discharge Instructions Discussed with and Provided to Patient:     Discharge Instructions      You were evaluated in the Emergency Department and after careful evaluation, we did not find any emergent condition requiring admission or further testing in the hospital.  Your exam/testing today is overall reassuring.  Stop drinking alcohol, follow-up with your regular doctor.  Please return to the Emergency Department if you experience any worsening of your condition.   Thank you for allowing us  to be a part of your care.       Edson Graces, MD 06/19/23 [phone]

## 2023-06-19 NOTE — ED Notes (Signed)
 Pt states he still feels nauseous and has abd pain and is concerned about going home with continued pain. MD notified and to bedside to speak with pt.

## 2023-06-19 NOTE — ED Provider Notes (Signed)
 [place] EMERGENCY DEPARTMENT AT [facility] Provider Note   CSN: [id number] Arrival date & time: 06/19/23  2143     History {Add pertinent medical, surgical, social history, OB history to HPI:1} Chief Complaint  Patient presents with   Abdominal Pain   Back Pain    Bryan Edwards is a 28 y.o. male.  HPI     This is a 28 year old male with history of alcohol induced pancreatitis who presents with abdominal and back pain.  Patient reports he went for a nap and woke up having pain.  Pain is similar to the pain he was having less than 24 hours ago and is consistent with his known pancreatitis.  He did continue to drink when he was discharged.  Recent admission for the same.  Reports nausea without vomiting.  States the pain is worse than it was 24 hours ago.   Home Medications Prior to Admission medications   Medication Sig Start Date End Date Taking? Authorizing Provider  HYDROcodone -acetaminophen  (NORCO/VICODIN) 5-325 MG tablet Take 1 tablet by mouth every 6 (six) hours as needed. 06/13/23   Eldon Greenland, MD  ondansetron  (ZOFRAN -ODT) 8 MG disintegrating tablet Take 1 tablet (8 mg total) by mouth every 8 (eight) hours as needed for vomiting or nausea. 06/13/23   Eldon Greenland, MD  pantoprazole  (PROTONIX ) 40 MG tablet Take 1 tablet (40 mg total) by mouth daily for 15 days. 06/03/23 06/18/23  Arrien, Curlee Doss, MD      Allergies    Other, Amoxicillin, Bee pollen, and Penicillins    Review of Systems   Review of Systems  Respiratory:  Negative for shortness of breath.   Cardiovascular:  Negative for chest pain.  Gastrointestinal:  Positive for abdominal pain and nausea. Negative for constipation, diarrhea and vomiting.  All other systems reviewed and are negative.   Physical Exam Updated Vital Signs BP (!) 133/95   Pulse 92   Ht 1.778 m (5\' 10" )   Wt 66 kg   SpO2 96%   BMI 20.88 kg/m  Physical Exam Vitals and nursing note reviewed.  Constitutional:       Appearance: He is well-developed.  HENT:     Head: Normocephalic and atraumatic.  Eyes:     Pupils: Pupils are equal, round, and reactive to light.  Cardiovascular:     Rate and Rhythm: Normal rate and regular rhythm.     Heart sounds: Normal heart sounds. No murmur heard. Pulmonary:     Effort: Pulmonary effort is normal. No respiratory distress.     Breath sounds: Normal breath sounds. No wheezing.  Abdominal:     General: Bowel sounds are normal.     Palpations: Abdomen is soft.     Tenderness: There is abdominal tenderness in the epigastric area. There is no rebound.  Musculoskeletal:     Cervical back: Neck supple.  Lymphadenopathy:     Cervical: No cervical adenopathy.  Skin:    General: Skin is warm and dry.  Neurological:     Mental Status: He is alert and oriented to person, place, and time.  Psychiatric:        Mood and Affect: Mood normal.     ED Results / Procedures / Treatments   Labs (all labs ordered are listed, but only abnormal results are displayed) Labs Reviewed  COMPREHENSIVE METABOLIC PANEL WITH GFR  ETHANOL  LIPASE, BLOOD  CBC WITH DIFFERENTIAL/PLATELET    EKG None  Radiology No results found.  Procedures Procedures  {Document  cardiac monitor, telemetry assessment procedure when appropriate:1}  Medications Ordered in ED Medications  sucralfate  (CARAFATE ) 1 GM/10ML suspension 1 g (has no administration in time range)  ondansetron  (ZOFRAN ) injection 4 mg (has no administration in time range)    ED Course/ Medical Decision Making/ A&P   {   Click here for ABCD2, HEART and other calculatorsREFRESH Note before signing :1}                              Medical Decision Making Risk Prescription drug management.   ***  {Document critical care time when appropriate:1} {Document review of labs and clinical decision tools ie heart score, Chads2Vasc2 etc:1}  {Document your independent review of radiology images, and any outside  records:1} {Document your discussion with family members, caretakers, and with consultants:1} {Document social determinants of health affecting pt's care:1} {Document your decision making why or why not admission, treatments were needed:1} Final Clinical Impression(s) / ED Diagnoses Final diagnoses:  None    Rx / DC Orders ED Discharge Orders     None

## 2023-06-19 NOTE — ED Notes (Signed)
Ambulatory to tx room  

## 2023-06-19 NOTE — ED Notes (Signed)
 Date and time results received: 06/19/23 2328 (use smartphrase ".now" to insert current time)  Test: Ethanol Critical Value: 356  Name of Provider Notified: Horton  Orders Received? Or Actions Taken?: N/A

## 2023-06-19 NOTE — ED Notes (Signed)
 Pt states he feels comfortable with DC after additional pain meds given. Pt Dc'd via wheelchair with mom as transport home.

## 2023-06-19 NOTE — Discharge Instructions (Signed)
 You were evaluated in the Emergency Department and after careful evaluation, we did not find any emergent condition requiring admission or further testing in the hospital.  Your exam/testing today is overall reassuring.  Stop drinking alcohol, follow-up with your regular doctor.  Please return to the Emergency Department if you experience any worsening of your condition.   Thank you for allowing us  to be a part of your care.

## 2023-06-19 NOTE — ED Notes (Signed)
 Pt instructed multiple times by this RN to call for ride as he is up for DC. Pt stated that he will call his mom. Pt visually seen standing in hallway and falling to ground, pt did not hit head or lose consciousness. Pt stated he got dizzy when he stood up. Dr Harless Lien notified and okay with pt being discharged at this time with no further follow up.

## 2023-06-19 NOTE — ED Notes (Signed)
 ED Provider at bedside.

## 2023-06-19 NOTE — ED Triage Notes (Signed)
 Pt stated that he took a nap earlier today and when he woke up, he was having extreme back and abd pain. Pt poor historian

## 2023-06-20 LAB — COMPREHENSIVE METABOLIC PANEL WITH GFR
ALT: 17 U/L (ref 0–44)
AST: 21 U/L (ref 15–41)
Albumin: 4.2 g/dL (ref 3.5–5.0)
Alkaline Phosphatase: 70 U/L (ref 38–126)
Anion gap: 12 (ref 5–15)
BUN: 10 mg/dL (ref 6–20)
CO2: 22 mmol/L (ref 22–32)
Calcium: 8.8 mg/dL — ABNORMAL LOW (ref 8.9–10.3)
Chloride: 109 mmol/L (ref 98–111)
Creatinine, Ser: 0.65 mg/dL (ref 0.61–1.24)
GFR, Estimated: >60 mL/min
Glucose, Bld: 114 mg/dL — ABNORMAL HIGH (ref 70–99)
Potassium: 3.6 mmol/L (ref 3.5–5.1)
Sodium: 143 mmol/L (ref 135–145)
Total Bilirubin: 0.3 mg/dL (ref 0.0–1.2)
Total Protein: 6.4 g/dL — ABNORMAL LOW (ref 6.5–8.1)

## 2023-06-20 MED ORDER — SUCRALFATE 1 G PO TABS: 1.0000 g | ORAL_TABLET | Freq: Three times a day (TID) | ORAL | 0 refills | Status: DC

## 2023-06-20 NOTE — Discharge Instructions (Signed)
 You were seen today for ongoing abdominal pain.  This is likely related to your ongoing alcohol use and abuse.  See resources provided for rehab and detox.  Take Carafate  as needed for pain.

## 2023-06-21 ENCOUNTER — Encounter (HOSPITAL_COMMUNITY): Payer: Self-pay | Admitting: Emergency Medicine

## 2023-06-21 ENCOUNTER — Other Ambulatory Visit: Payer: Self-pay

## 2023-06-21 ENCOUNTER — Emergency Department (HOSPITAL_COMMUNITY)

## 2023-06-21 ENCOUNTER — Emergency Department (HOSPITAL_COMMUNITY)
Admission: EM | Admit: 2023-06-21 | Discharge: 2023-06-21 | Disposition: A | Discharge status: Home / Self Care | Attending: Emergency Medicine | Admitting: Emergency Medicine

## 2023-06-21 DIAGNOSIS — K2920 Alcoholic gastritis without bleeding: Secondary | ICD-10-CM

## 2023-06-21 DIAGNOSIS — R112 Nausea with vomiting, unspecified: Secondary | ICD-10-CM | POA: Diagnosis not present

## 2023-06-21 DIAGNOSIS — R1013 Epigastric pain: Secondary | ICD-10-CM | POA: Insufficient documentation

## 2023-06-21 LAB — URINALYSIS, ROUTINE W REFLEX MICROSCOPIC
Bilirubin Urine: NEGATIVE
Glucose, UA: NEGATIVE mg/dL
Hgb urine dipstick: NEGATIVE
Ketones, ur: NEGATIVE mg/dL
Leukocytes,Ua: NEGATIVE
Nitrite: NEGATIVE
Protein, ur: NEGATIVE mg/dL
Specific Gravity, Urine: 1.023 (ref 1.005–1.030)
pH: 7 (ref 5.0–8.0)

## 2023-06-21 LAB — CBC WITH DIFFERENTIAL/PLATELET
Abs Immature Granulocytes: 0.01 10*3/uL (ref 0.00–0.07)
Basophils Absolute: 0.1 10*3/uL (ref 0.0–0.1)
Basophils Relative: 1 %
Eosinophils Absolute: 0.2 10*3/uL (ref 0.0–0.5)
Eosinophils Relative: 2 %
HCT: 44.9 % (ref 39.0–52.0)
Hemoglobin: 15.8 g/dL (ref 13.0–17.0)
Immature Granulocytes: 0 %
Lymphocytes Relative: 36 %
Lymphs Abs: 2.5 10*3/uL (ref 0.7–4.0)
MCH: 31.6 pg (ref 26.0–34.0)
MCHC: 35.2 g/dL (ref 30.0–36.0)
MCV: 89.8 fL (ref 80.0–100.0)
Monocytes Absolute: 0.5 10*3/uL (ref 0.1–1.0)
Monocytes Relative: 7 %
Neutro Abs: 3.8 10*3/uL (ref 1.7–7.7)
Neutrophils Relative %: 54 %
Platelets: 251 10*3/uL (ref 150–400)
RBC: 5 MIL/uL (ref 4.22–5.81)
RDW: 13.2 % (ref 11.5–15.5)
WBC: 7 10*3/uL (ref 4.0–10.5)
nRBC: 0 % (ref 0.0–0.2)

## 2023-06-21 LAB — COMPREHENSIVE METABOLIC PANEL WITH GFR
ALT: 16 U/L (ref 0–44)
AST: 27 U/L (ref 15–41)
Albumin: 4.3 g/dL (ref 3.5–5.0)
Alkaline Phosphatase: 88 U/L (ref 38–126)
Anion gap: 11 (ref 5–15)
BUN: 10 mg/dL (ref 6–20)
CO2: 27 mmol/L (ref 22–32)
Calcium: 8.5 mg/dL — ABNORMAL LOW (ref 8.9–10.3)
Chloride: 104 mmol/L (ref 98–111)
Creatinine, Ser: 0.87 mg/dL (ref 0.61–1.24)
GFR, Estimated: >60 mL/min (ref >60)
Glucose, Bld: 109 mg/dL — ABNORMAL HIGH (ref 70–99)
Potassium: 3.5 mmol/L (ref 3.5–5.1)
Sodium: 142 mmol/L (ref 135–145)
Total Bilirubin: 0.5 mg/dL (ref 0.0–1.2)
Total Protein: 6.8 g/dL (ref 6.5–8.1)

## 2023-06-21 LAB — RAPID URINE DRUG SCREEN, HOSP PERFORMED
Amphetamines: NOT DETECTED
Barbiturates: NOT DETECTED
Benzodiazepines: NOT DETECTED
Cocaine: NOT DETECTED
Opiates: NOT DETECTED
Tetrahydrocannabinol: NOT DETECTED

## 2023-06-21 LAB — ETHANOL: Alcohol, Ethyl (B): 316 mg/dL (ref <15)

## 2023-06-21 LAB — LIPASE, BLOOD: Lipase: 33 U/L (ref 11–51)

## 2023-06-21 LAB — TROPONIN I (HIGH SENSITIVITY): Troponin I (High Sensitivity): 3 ng/L (ref <18)

## 2023-06-21 MED ORDER — LACTATED RINGERS IV BOLUS
1000.0000 mL | Freq: Once | INTRAVENOUS | Status: AC
  Administered 2023-06-21: 1000 mL via INTRAVENOUS

## 2023-06-21 MED ORDER — DICYCLOMINE HCL 20 MG PO TABS: 20.0000 mg | ORAL_TABLET | Freq: Two times a day (BID) | ORAL | 0 refills | Status: DC

## 2023-06-21 MED ORDER — HYDROMORPHONE HCL 1 MG/ML IJ SOLN
1.0000 mg | Freq: Once | INTRAMUSCULAR | Status: AC
  Administered 2023-06-21: 1 mg via INTRAVENOUS
  Filled 2023-06-21: qty 1

## 2023-06-21 MED ORDER — ONDANSETRON HCL 4 MG/2ML IJ SOLN
4.0000 mg | Freq: Once | INTRAMUSCULAR | Status: AC
  Administered 2023-06-21: 4 mg via INTRAVENOUS
  Filled 2023-06-21: qty 2

## 2023-06-21 MED ORDER — FAMOTIDINE 20 MG PO TABS: 20.0000 mg | ORAL_TABLET | Freq: Two times a day (BID) | ORAL | 0 refills | Status: DC

## 2023-06-21 MED ORDER — PANTOPRAZOLE SODIUM 40 MG IV SOLR
40.0000 mg | Freq: Once | INTRAVENOUS | Status: AC
  Administered 2023-06-21: 40 mg via INTRAVENOUS
  Filled 2023-06-21: qty 10

## 2023-06-21 MED ORDER — ONDANSETRON 4 MG PO TBDP: 4.0000 mg | ORAL_TABLET | Freq: Three times a day (TID) | ORAL | 0 refills | Status: DC | PRN

## 2023-06-21 MED ORDER — IOHEXOL 300 MG/ML  SOLN
100.0000 mL | Freq: Once | INTRAMUSCULAR | Status: AC | PRN
  Administered 2023-06-21: 100 mL via INTRAVENOUS

## 2023-06-21 NOTE — ED Notes (Signed)
 Pt/family received d/c paperwork at this time. After going over the paperwork any questions, comments, or concerns were answered to the best of this nurse's knowledge. The pt/family verbally acknowledged the teachings/instructions.

## 2023-06-21 NOTE — ED Provider Notes (Signed)
 [place] EMERGENCY DEPARTMENT AT [facility] Provider Note   CSN: [id number] Arrival date & time: 06/21/23  1639     History  Chief Complaint  Patient presents with   Chest Pain   Abdominal Pain    Bryan Edwards is a 28 y.o. male.  Patient is a 28 year old male with a past medical history of pancreatitis and alcohol abuse who presents to the emergency department with a chief complaint of vomiting, epigastric abdominal pain which has been ongoing for the past few days.  He was recently evaluated in the emergency department and discharged home.  Patient notes that he has continued to drink alcohol.  He denies any associated dizziness, lightheadedness or syncope.  He has had no associated fever or chills.  He does admit to associated chest discomfort.   Chest Pain Associated symptoms: abdominal pain   Abdominal Pain Associated symptoms: chest pain        Home Medications Prior to Admission medications   Medication Sig Start Date End Date Taking? Authorizing Provider  HYDROcodone -acetaminophen  (NORCO/VICODIN) 5-325 MG tablet Take 1 tablet by mouth every 6 (six) hours as needed. 06/13/23   Eldon Greenland, MD  ondansetron  (ZOFRAN -ODT) 8 MG disintegrating tablet Take 1 tablet (8 mg total) by mouth every 8 (eight) hours as needed for vomiting or nausea. 06/13/23   Eldon Greenland, MD  pantoprazole  (PROTONIX ) 40 MG tablet Take 1 tablet (40 mg total) by mouth daily for 15 days. 06/03/23 06/18/23  Arrien, Mauricio Daniel, MD  sucralfate  (CARAFATE ) 1 g tablet Take 1 tablet (1 g total) by mouth 4 (four) times daily -  with meals and at bedtime. 06/20/23   Horton, Vonzella Guernsey, MD      Allergies    Other, Amoxicillin, Bee pollen, and Penicillins    Review of Systems   Review of Systems  Cardiovascular:  Positive for chest pain.  Gastrointestinal:  Positive for abdominal pain.  All other systems reviewed and are negative.   Physical Exam Updated Vital Signs BP (!) 134/101 (BP  Location: Left Arm)   Pulse 92   Temp 98.7 F (37.1 C) (Oral)   Resp (!) 9   Ht 5\' 10"  (1.778 m)   Wt 59 kg   SpO2 93%   BMI 18.65 kg/m  Physical Exam Vitals and nursing note reviewed.  Constitutional:      Appearance: Normal appearance.  HENT:     Head: Normocephalic and atraumatic.     Nose: Nose normal.     Mouth/Throat:     Mouth: Mucous membranes are moist.  Eyes:     Extraocular Movements: Extraocular movements intact.     Conjunctiva/sclera: Conjunctivae normal.     Pupils: Pupils are equal, round, and reactive to light.  Cardiovascular:     Rate and Rhythm: Normal rate and regular rhythm.     Pulses: Normal pulses.     Heart sounds: Normal heart sounds.  Pulmonary:     Effort: Pulmonary effort is normal. No tachypnea.     Breath sounds: Normal breath sounds. No decreased breath sounds, wheezing, rhonchi or rales.  Chest:     Chest wall: No tenderness.  Abdominal:     General: Abdomen is flat. Bowel sounds are normal.     Palpations: Abdomen is soft. There is no mass.     Comments: Tender to palpation diffusely with guarding  Musculoskeletal:        General: Normal range of motion.     Cervical back: Normal range  of motion and neck supple.     Right lower leg: No edema.     Left lower leg: No edema.  Skin:    General: Skin is warm and dry.     Findings: No rash.  Neurological:     General: No focal deficit present.     Mental Status: He is alert and oriented to person, place, and time. Mental status is at baseline.  Psychiatric:        Mood and Affect: Mood normal.        Behavior: Behavior normal.        Thought Content: Thought content normal.        Judgment: Judgment normal.     ED Results / Procedures / Treatments   Labs (all labs ordered are listed, but only abnormal results are displayed) Labs Reviewed  COMPREHENSIVE METABOLIC PANEL WITH GFR - Abnormal; Notable for the following components:      Result Value   Glucose, Bld 109 (*)    Calcium  8.5 (*)    All other components within normal limits  ETHANOL - Abnormal; Notable for the following components:   Alcohol, Ethyl (B) 316 (*)    All other components within normal limits  LIPASE, BLOOD  CBC WITH DIFFERENTIAL/PLATELET  URINALYSIS, ROUTINE W REFLEX MICROSCOPIC  RAPID URINE DRUG SCREEN, HOSP PERFORMED  TROPONIN I (HIGH SENSITIVITY)    EKG None  Radiology No results found.  Procedures Procedures    Medications Ordered in ED Medications  HYDROmorphone  (DILAUDID ) injection 1 mg (1 mg Intravenous Given 06/21/23 1805)  ondansetron  (ZOFRAN ) injection 4 mg (4 mg Intravenous Given 06/21/23 1806)  lactated ringers  bolus 1,000 mL (1,000 mLs Intravenous New Bag/Given 06/21/23 1804)  pantoprazole  (PROTONIX ) injection 40 mg (40 mg Intravenous Given 06/21/23 1805)  iohexol  (OMNIPAQUE ) 300 MG/ML solution 100 mL (100 mLs Intravenous Contrast Given 06/21/23 1814)    ED Course/ Medical Decision Making/ A&P                                 Medical Decision Making Amount and/or Complexity of Data Reviewed Labs: ordered. Radiology: ordered.  Risk Prescription drug management.   This patient presents to the ED for concern of abdominal pain, nausea and vomiting differential diagnosis includes pancreatitis, gastritis, acute viral syndrome, gastroenteritis    Additional history obtained:  Additional history obtained from medical records External records from outside source obtained and reviewed including records   Lab Tests:  I Ordered, and personally interpreted labs.  The pertinent results include: No leukocytosis, no anemia, elevated alcohol level, unremarkable urinalysis, normal kidney function liver function, normal electrolytes   Imaging Studies ordered:  I ordered imaging studies including CT scan of abdomen and pelvis I independently visualized and interpreted imaging which showed no acute surgical process I agree with the radiologist interpretation   Medicines  ordered and prescription drug management:  I ordered medication including Bentyl, Pepcid, Zofran  for nausea and vomiting, gastritis Reevaluation of the patient after these medicines showed that the patient improved I have reviewed the patients home medicines and have made adjustments as needed   Problem List / ED Course:  Patient is doing much better at this time and is stable for discharge home.  He has had no further nausea or vomiting in the emergency department.  CT scan of the abdomen and pelvis demonstrated no signs of acute surgical process.  Patient was able to urinate after  coming back from CT scan.  Patient has no clinical indication of urinary retention at this point.  Patient has no indication for acute pancreatitis.  He is tolerating p.o. intake without difficulty at this time.  Will continue treatment for gastritis and recommend close follow-up with gastroenterology.  Educated patient on the importance of alcohol cessation.  Patient was evaluated by 10 physician who is in agreement to plan.  Strict turn precautions were provided for any new or worsening symptoms.  Patient voiced understanding and had no additional questions.   Social Determinants of Health:  None           Final Clinical Impression(s) / ED Diagnoses Final diagnoses:  None    Rx / DC Orders ED Discharge Orders     None         Lashawndra Lampkins D, PA-C 06/21/23 [place]    Guadalupe Lee, MD 06/21/23 2039

## 2023-06-21 NOTE — ED Triage Notes (Addendum)
 Pt states he has epigastric pain, emesis and nausea. Hx of pancreatitis. Poor historian.

## 2023-06-21 NOTE — Discharge Instructions (Signed)
 Please follow-up closely with gastroenterology on an outpatient basis.  Please decrease your alcohol intake as this is most likely the cause of your symptoms.  Return to emergency department immediately for any new or worsening symptoms.

## 2023-08-01 ENCOUNTER — Emergency Department (HOSPITAL_COMMUNITY)
Admission: EM | Admit: 2023-08-01 | Discharge: 2023-08-02 | Disposition: A | Discharge status: Home / Self Care | Source: Home / Self Care | Attending: Emergency Medicine | Admitting: Emergency Medicine

## 2023-08-01 ENCOUNTER — Encounter (HOSPITAL_COMMUNITY): Payer: Self-pay

## 2023-08-01 ENCOUNTER — Other Ambulatory Visit: Payer: Self-pay

## 2023-08-01 DIAGNOSIS — F17210 Nicotine dependence, cigarettes, uncomplicated: Secondary | ICD-10-CM | POA: Insufficient documentation

## 2023-08-01 DIAGNOSIS — R1013 Epigastric pain: Secondary | ICD-10-CM | POA: Insufficient documentation

## 2023-08-01 DIAGNOSIS — R1110 Vomiting, unspecified: Secondary | ICD-10-CM | POA: Insufficient documentation

## 2023-08-01 LAB — CBC
HCT: 48.3 % (ref 39.0–52.0)
Hemoglobin: 16.8 g/dL (ref 13.0–17.0)
MCH: 31.7 pg (ref 26.0–34.0)
MCHC: 34.8 g/dL (ref 30.0–36.0)
MCV: 91.1 fL (ref 80.0–100.0)
Platelets: 196 10*3/uL (ref 150–400)
RBC: 5.3 MIL/uL (ref 4.22–5.81)
RDW: 12.8 % (ref 11.5–15.5)
WBC: 8.8 10*3/uL (ref 4.0–10.5)
nRBC: 0 % (ref 0.0–0.2)

## 2023-08-01 LAB — COMPREHENSIVE METABOLIC PANEL WITH GFR
ALT: 29 U/L (ref 0–44)
AST: 36 U/L (ref 15–41)
Albumin: 5 g/dL (ref 3.5–5.0)
Alkaline Phosphatase: 86 U/L (ref 38–126)
Anion gap: 17 — ABNORMAL HIGH (ref 5–15)
BUN: 9 mg/dL (ref 6–20)
CO2: 22 mmol/L (ref 22–32)
Calcium: 9.6 mg/dL (ref 8.9–10.3)
Chloride: 99 mmol/L (ref 98–111)
Creatinine, Ser: 0.54 mg/dL — ABNORMAL LOW (ref 0.61–1.24)
GFR, Estimated: >60 mL/min (ref >60)
Glucose, Bld: 113 mg/dL — ABNORMAL HIGH (ref 70–99)
Potassium: 3.5 mmol/L (ref 3.5–5.1)
Sodium: 138 mmol/L (ref 135–145)
Total Bilirubin: 0.8 mg/dL (ref 0.0–1.2)
Total Protein: 7.7 g/dL (ref 6.5–8.1)

## 2023-08-01 LAB — URINALYSIS, ROUTINE W REFLEX MICROSCOPIC
Bilirubin Urine: NEGATIVE
Glucose, UA: NEGATIVE mg/dL
Hgb urine dipstick: NEGATIVE
Ketones, ur: NEGATIVE mg/dL
Leukocytes,Ua: NEGATIVE
Nitrite: NEGATIVE
Protein, ur: NEGATIVE mg/dL
Specific Gravity, Urine: 1.003 — ABNORMAL LOW (ref 1.005–1.030)
pH: 6 (ref 5.0–8.0)

## 2023-08-01 LAB — LIPASE, BLOOD: Lipase: 153 U/L — ABNORMAL HIGH (ref 11–51)

## 2023-08-01 MED ORDER — MORPHINE SULFATE (PF) 4 MG/ML IV SOLN
4.0000 mg | Freq: Once | INTRAVENOUS | Status: AC
  Filled 2023-08-01: qty 1

## 2023-08-01 MED ORDER — SODIUM CHLORIDE 0.9 % IV BOLUS: 1000.0000 mL | Freq: Once | INTRAVENOUS | Status: AC

## 2023-08-01 MED ORDER — PANTOPRAZOLE SODIUM 40 MG IV SOLR
40.0000 mg | Freq: Once | INTRAVENOUS | Status: AC
  Administered 2023-08-01: 40 mg via INTRAVENOUS
  Filled 2023-08-01: qty 10

## 2023-08-01 MED ORDER — ONDANSETRON 4 MG PO TBDP
4.0000 mg | ORAL_TABLET | Freq: Once | ORAL | Status: AC | PRN
  Filled 2023-08-01: qty 1

## 2023-08-01 NOTE — ED Triage Notes (Signed)
 Pt stated that he has been having stomach pain, emesis an nausea since Monday

## 2023-08-02 ENCOUNTER — Inpatient Hospital Stay (HOSPITAL_COMMUNITY)
Admission: EM | Admit: 2023-08-02 | Discharge: 2023-08-07 | DRG: 440 | Disposition: A | Discharge status: Home / Self Care | Attending: Family Medicine | Admitting: Family Medicine

## 2023-08-02 ENCOUNTER — Emergency Department (HOSPITAL_COMMUNITY)

## 2023-08-02 ENCOUNTER — Encounter (HOSPITAL_COMMUNITY): Payer: Self-pay

## 2023-08-02 ENCOUNTER — Other Ambulatory Visit: Payer: Self-pay

## 2023-08-02 DIAGNOSIS — E876 Hypokalemia: Secondary | ICD-10-CM | POA: Diagnosis not present

## 2023-08-02 DIAGNOSIS — K2920 Alcoholic gastritis without bleeding: Secondary | ICD-10-CM | POA: Diagnosis present

## 2023-08-02 DIAGNOSIS — Z91030 Bee allergy status: Secondary | ICD-10-CM

## 2023-08-02 DIAGNOSIS — Z86718 Personal history of other venous thrombosis and embolism: Secondary | ICD-10-CM

## 2023-08-02 DIAGNOSIS — Z880 Allergy status to penicillin: Secondary | ICD-10-CM | POA: Diagnosis not present

## 2023-08-02 DIAGNOSIS — R112 Nausea with vomiting, unspecified: Secondary | ICD-10-CM | POA: Insufficient documentation

## 2023-08-02 DIAGNOSIS — D696 Thrombocytopenia, unspecified: Secondary | ICD-10-CM | POA: Diagnosis present

## 2023-08-02 DIAGNOSIS — Z79899 Other long term (current) drug therapy: Secondary | ICD-10-CM

## 2023-08-02 DIAGNOSIS — Z7901 Long term (current) use of anticoagulants: Secondary | ICD-10-CM | POA: Diagnosis not present

## 2023-08-02 DIAGNOSIS — F1090 Alcohol use, unspecified, uncomplicated: Secondary | ICD-10-CM | POA: Diagnosis not present

## 2023-08-02 DIAGNOSIS — E872 Acidosis, unspecified: Secondary | ICD-10-CM | POA: Diagnosis not present

## 2023-08-02 DIAGNOSIS — K859 Acute pancreatitis without necrosis or infection, unspecified: Secondary | ICD-10-CM | POA: Diagnosis not present

## 2023-08-02 DIAGNOSIS — K852 Alcohol induced acute pancreatitis without necrosis or infection: Secondary | ICD-10-CM | POA: Diagnosis present

## 2023-08-02 DIAGNOSIS — K838 Other specified diseases of biliary tract: Secondary | ICD-10-CM | POA: Diagnosis present

## 2023-08-02 DIAGNOSIS — R1011 Right upper quadrant pain: Secondary | ICD-10-CM | POA: Diagnosis present

## 2023-08-02 DIAGNOSIS — Z720 Tobacco use: Secondary | ICD-10-CM | POA: Diagnosis present

## 2023-08-02 DIAGNOSIS — K760 Fatty (change of) liver, not elsewhere classified: Secondary | ICD-10-CM | POA: Diagnosis present

## 2023-08-02 DIAGNOSIS — Z881 Allergy status to other antibiotic agents status: Secondary | ICD-10-CM | POA: Diagnosis not present

## 2023-08-02 DIAGNOSIS — F17210 Nicotine dependence, cigarettes, uncomplicated: Secondary | ICD-10-CM | POA: Diagnosis present

## 2023-08-02 DIAGNOSIS — R748 Abnormal levels of other serum enzymes: Secondary | ICD-10-CM | POA: Diagnosis present

## 2023-08-02 DIAGNOSIS — F1010 Alcohol abuse, uncomplicated: Secondary | ICD-10-CM | POA: Diagnosis present

## 2023-08-02 DIAGNOSIS — Z716 Tobacco abuse counseling: Secondary | ICD-10-CM

## 2023-08-02 LAB — COMPREHENSIVE METABOLIC PANEL WITH GFR
ALT: 26 U/L (ref 0–44)
AST: 33 U/L (ref 15–41)
Albumin: 5 g/dL (ref 3.5–5.0)
Alkaline Phosphatase: 74 U/L (ref 38–126)
Anion gap: 13 (ref 5–15)
BUN: 7 mg/dL (ref 6–20)
CO2: 26 mmol/L (ref 22–32)
Calcium: 9.9 mg/dL (ref 8.9–10.3)
Chloride: 98 mmol/L (ref 98–111)
Creatinine, Ser: 0.55 mg/dL — ABNORMAL LOW (ref 0.61–1.24)
GFR, Estimated: >60 mL/min (ref >60)
Glucose, Bld: 87 mg/dL (ref 70–99)
Potassium: 3.5 mmol/L (ref 3.5–5.1)
Sodium: 137 mmol/L (ref 135–145)
Total Bilirubin: 1.4 mg/dL — ABNORMAL HIGH (ref 0.0–1.2)
Total Protein: 7.4 g/dL (ref 6.5–8.1)

## 2023-08-02 LAB — URINALYSIS, ROUTINE W REFLEX MICROSCOPIC
Bacteria, UA: NONE SEEN
Bilirubin Urine: NEGATIVE
Glucose, UA: NEGATIVE mg/dL
Hgb urine dipstick: NEGATIVE
Ketones, ur: NEGATIVE mg/dL
Nitrite: NEGATIVE
Protein, ur: NEGATIVE mg/dL
Specific Gravity, Urine: 1.002 — ABNORMAL LOW (ref 1.005–1.030)
WBC, UA: >50 WBC/hpf (ref 0–5)
pH: 7 (ref 5.0–8.0)

## 2023-08-02 LAB — CBC WITH DIFFERENTIAL/PLATELET
Abs Immature Granulocytes: 0.04 10*3/uL (ref 0.00–0.07)
Basophils Absolute: 0 10*3/uL (ref 0.0–0.1)
Basophils Relative: 0 %
Eosinophils Absolute: 0.1 10*3/uL (ref 0.0–0.5)
Eosinophils Relative: 1 %
HCT: 44.8 % (ref 39.0–52.0)
Hemoglobin: 16.1 g/dL (ref 13.0–17.0)
Immature Granulocytes: 0 %
Lymphocytes Relative: 20 %
Lymphs Abs: 2 10*3/uL (ref 0.7–4.0)
MCH: 32.5 pg (ref 26.0–34.0)
MCHC: 35.9 g/dL (ref 30.0–36.0)
MCV: 90.5 fL (ref 80.0–100.0)
Monocytes Absolute: 0.8 10*3/uL (ref 0.1–1.0)
Monocytes Relative: 8 %
Neutro Abs: 7 10*3/uL (ref 1.7–7.7)
Neutrophils Relative %: 71 %
Platelets: 162 10*3/uL (ref 150–400)
RBC: 4.95 MIL/uL (ref 4.22–5.81)
RDW: 12.6 % (ref 11.5–15.5)
WBC: 9.9 10*3/uL (ref 4.0–10.5)
nRBC: 0 % (ref 0.0–0.2)

## 2023-08-02 MED ORDER — MORPHINE SULFATE (PF) 4 MG/ML IV SOLN
4.0000 mg | Freq: Once | INTRAVENOUS | Status: AC
  Administered 2023-08-02: 4 mg via INTRAVENOUS
  Filled 2023-08-02: qty 1

## 2023-08-02 MED ORDER — PANTOPRAZOLE SODIUM 40 MG IV SOLR
40.0000 mg | Freq: Once | INTRAVENOUS | Status: AC
  Administered 2023-08-02: 40 mg via INTRAVENOUS
  Filled 2023-08-02: qty 10

## 2023-08-02 MED ORDER — SUCRALFATE 1 G PO TABS: 1.0000 g | ORAL_TABLET | Freq: Four times a day (QID) | ORAL | 0 refills | Status: DC | PRN

## 2023-08-02 MED ORDER — OMEPRAZOLE 20 MG PO CPDR: 20.0000 mg | DELAYED_RELEASE_CAPSULE | Freq: Every day | ORAL | 1 refills | Status: DC

## 2023-08-02 MED ORDER — LACTATED RINGERS IV BOLUS
1000.0000 mL | Freq: Once | INTRAVENOUS | Status: AC
  Administered 2023-08-02: 1000 mL via INTRAVENOUS

## 2023-08-02 MED ORDER — HYDROMORPHONE HCL 1 MG/ML IJ SOLN
1.0000 mg | Freq: Once | INTRAMUSCULAR | Status: AC
  Administered 2023-08-02: 1 mg via INTRAVENOUS
  Filled 2023-08-02: qty 1

## 2023-08-02 MED ORDER — OXYCODONE HCL 5 MG PO TABS
5.0000 mg | ORAL_TABLET | Freq: Once | ORAL | Status: AC
  Administered 2023-08-02: 5 mg via ORAL
  Filled 2023-08-02: qty 1

## 2023-08-02 MED ORDER — ONDANSETRON HCL 4 MG/2ML IJ SOLN
4.0000 mg | Freq: Once | INTRAMUSCULAR | Status: AC
  Administered 2023-08-02: 4 mg via INTRAVENOUS
  Filled 2023-08-02: qty 2

## 2023-08-02 MED ORDER — CHLORDIAZEPOXIDE HCL 25 MG PO CAPS: ORAL_CAPSULE | ORAL | 0 refills | Status: DC

## 2023-08-02 MED ORDER — IOHEXOL 300 MG/ML  SOLN
100.0000 mL | Freq: Once | INTRAMUSCULAR | Status: AC | PRN
  Administered 2023-08-02: 100 mL via INTRAVENOUS

## 2023-08-02 MED ORDER — HYDROMORPHONE HCL 1 MG/ML IJ SOLN
1.0000 mg | Freq: Once | INTRAMUSCULAR | Status: AC | PRN
  Administered 2023-08-02: 1 mg via INTRAVENOUS
  Filled 2023-08-02: qty 1

## 2023-08-02 NOTE — ED Triage Notes (Signed)
 Pt here for abd pain through to back, says feels like pancreatitis, pt with hx of same, pt reports last time he drank was Friday, pt says he was here last night for same, CT scan didn't show anything and he has not picked up meds yet.

## 2023-08-02 NOTE — Discharge Instructions (Addendum)
 You were evaluated in the Emergency Department and after careful evaluation, we did not find any emergent condition requiring admission or further testing in the hospital.  Your exam/testing today is overall reassuring.  Symptoms likely due to acid reflux triggered by alcohol use.  We discussed the importance of quitting alcohol.  Use the Librium  to detox at home as we discussed.  Use the omeprazole daily to prevent pain.  Use the Carafate  throughout the day for more immediate relief.  Please return to the Emergency Department if you experience any worsening of your condition.   Thank you for allowing us  to be a part of your care.

## 2023-08-02 NOTE — ED Provider Notes (Signed)
 [place] EMERGENCY DEPARTMENT AT [facility] Provider Note   CSN: [id number] Arrival date & time: 08/02/23  1948     Patient presents with: Abdominal Pain   Bryan Edwards is a 28 y.o. male. With history of alcohol abuse and alcoholic pancreatitis.  Presents ER for evaluation of worsening abdominal pain.  Was yesterday for the same.  He was drinking heavily on Friday night, this is the last intake of alcohol he reports.  He was seen in the ER yesterday and have slightly elevated lipase, normal CT and was sent home with medications.  He reports he is having worsening pain with nausea and vomiting today.  Pain radiates into his back and feels like his usual pancreatitis, no fevers or chills.   {Add pertinent medical, surgical, social history, OB history to HPI:32947}  Abdominal Pain      Prior to Admission medications   Medication Sig Start Date End Date Taking? Authorizing Provider  chlordiazePOXIDE  (LIBRIUM ) 25 MG capsule 50mg  PO TID x 1D, then 25-50mg  PO BID X 1D, then 25-50mg  PO QD X 1D 08/02/23   Edson Graces, MD  dicyclomine  (BENTYL ) 20 MG tablet Take 1 tablet (20 mg total) by mouth 2 (two) times daily. 06/21/23   Roselynn Connors, PA-C  famotidine  (PEPCID ) 20 MG tablet Take 1 tablet (20 mg total) by mouth 2 (two) times daily. 06/21/23   Roselynn Connors, PA-C  HYDROcodone -acetaminophen  (NORCO/VICODIN) 5-325 MG tablet Take 1 tablet by mouth every 6 (six) hours as needed. 06/13/23   Eldon Greenland, MD  omeprazole (PRILOSEC) 20 MG capsule Take 1 capsule (20 mg total) by mouth daily. 08/02/23   Edson Graces, MD  ondansetron  (ZOFRAN -ODT) 4 MG disintegrating tablet Take 1 tablet (4 mg total) by mouth every 8 (eight) hours as needed for nausea or vomiting. 06/21/23   Pearletha Bouche D, PA-C  sucralfate  (CARAFATE ) 1 g tablet Take 1 tablet (1 g total) by mouth 4 (four) times daily as needed. 08/02/23   Edson Graces, MD    Allergies: Other, Amoxicillin, Bee pollen,  and Penicillins    Review of Systems  Gastrointestinal:  Positive for abdominal pain.    Updated Vital Signs BP (!) 136/102   Pulse 62   Temp 97.9 F (36.6 C) (Oral)   Resp 18   Ht 5' 10 (1.778 m)   Wt 59 kg   SpO2 99%   BMI 18.65 kg/m   Physical Exam Vitals and nursing note reviewed.  Constitutional:      General: He is not in acute distress.    Appearance: He is well-developed.  HENT:     Head: Normocephalic and atraumatic.   Eyes:     Conjunctiva/sclera: Conjunctivae normal.    Cardiovascular:     Rate and Rhythm: Normal rate and regular rhythm.     Heart sounds: No murmur heard. Pulmonary:     Effort: Pulmonary effort is normal. No respiratory distress.     Breath sounds: Normal breath sounds.  Abdominal:     Palpations: Abdomen is soft.     Tenderness: There is no abdominal tenderness.   Musculoskeletal:        General: No swelling.     Cervical back: Neck supple.   Skin:    General: Skin is warm and dry.     Capillary Refill: Capillary refill takes less than 2 seconds.   Neurological:     Mental Status: He is alert.   Psychiatric:  Mood and Affect: Mood normal.    (all labs ordered are listed, but only abnormal results are displayed) Labs Reviewed  COMPREHENSIVE METABOLIC PANEL WITH GFR - Abnormal; Notable for the following components:      Result Value   Creatinine, Ser 0.55 (*)    Total Bilirubin 1.4 (*)    All other components within normal limits  LIPASE, BLOOD - Abnormal; Notable for the following components:   Lipase 700 (*)    All other components within normal limits  URINALYSIS, ROUTINE W REFLEX MICROSCOPIC - Abnormal; Notable for the following components:   Color, Urine COLORLESS (*)    Specific Gravity, Urine 1.002 (*)    Leukocytes,Ua SMALL (*)    All other components within normal limits  CBC WITH DIFFERENTIAL/PLATELET    EKG: None  Radiology: CT ABDOMEN PELVIS W CONTRAST Result Date: 08/02/2023 CLINICAL DATA:   Abdominal pain with nausea and vomiting. EXAM: CT ABDOMEN AND PELVIS WITH CONTRAST TECHNIQUE: Multidetector CT imaging of the abdomen and pelvis was performed using the standard protocol following bolus administration of intravenous contrast. RADIATION DOSE REDUCTION: This exam was performed according to the departmental dose-optimization program which includes automated exposure control, adjustment of the mA and/or kV according to patient size and/or use of iterative reconstruction technique. CONTRAST:  OMNIPAQUE  IOHEXOL  300 MG/ML  SOLN COMPARISON:  Jun 21, 2023 FINDINGS: Lower chest: No acute abnormality. Hepatobiliary: There is diffuse fatty infiltration of the liver parenchyma. No focal liver abnormality is seen. No gallstones, gallbladder wall thickening, or biliary dilatation. Pancreas: Unremarkable. No pancreatic ductal dilatation or surrounding inflammatory changes. Spleen: Normal in size without focal abnormality. Adrenals/Urinary Tract: Adrenal glands are unremarkable. Kidneys are normal in size, without renal calculi or hydronephrosis. A stable subcentimeter cyst is seen within the posterior aspect of the mid right kidney. The urinary bladder is markedly distended and is otherwise unremarkable. Stomach/Bowel: Stomach is within normal limits. Appendix appears normal. No evidence of bowel wall thickening, distention, or inflammatory changes. Vascular/Lymphatic: No significant vascular findings are present. No enlarged abdominal or pelvic lymph nodes. Reproductive: Prostate is unremarkable. Other: No abdominal wall hernia or abnormality. No abdominopelvic ascites. Musculoskeletal: No acute or significant osseous findings. IMPRESSION: 1. Hepatic steatosis. 2. No acute or active process within the abdomen or pelvis. Electronically Signed   By: Virgle Grime M.D.   On: 08/02/2023 00:38    {Document cardiac monitor, telemetry assessment procedure when appropriate:32947} Procedures   Medications  Ordered in the ED  ondansetron  (ZOFRAN ) injection 4 mg (4 mg Intravenous Given 08/02/23 2110)  morphine  (PF) 4 MG/ML injection 4 mg (4 mg Intravenous Given 08/02/23 2110)  lactated ringers  bolus 1,000 mL (1,000 mLs Intravenous New Bag/Given 08/02/23 2110)  pantoprazole  (PROTONIX ) injection 40 mg (40 mg Intravenous Given 08/02/23 2111)  HYDROmorphone  (DILAUDID ) injection 1 mg (1 mg Intravenous Given 08/02/23 2247)    Clinical Course as of 08/02/23 2343  Sun Aug 02, 2023  2336 With history of alcohol abuse and alcoholic pancreatitis.  Presents ER for evaluation of worsening abdominal pain.  Was yesterday for the same.  He was drinking heavily on Friday night, this is the last intake of alcohol he reports.  He was seen in the ER yesterday and have slightly elevated lipase, normal CT and was sent home with medications.  He reports he is having worsening pain with nausea and vomiting today.  Pain radiates into his back and feels like his usual pancreatitis, no fevers or chills. [CB]    Clinical Course  User Index [CB] Baxter Limber A, PA-C   {Click here for ABCD2, HEART and other calculators REFRESH Note before signing:1}                              Medical Decision Making Amount and/or Complexity of Data Reviewed Labs: ordered.  Risk Prescription drug management. Decision regarding hospitalization.   ***  {Document critical care time when appropriate  Document review of labs and clinical decision tools ie CHADS2VASC2, etc  Document your independent review of radiology images and any outside records  Document your discussion with family members, caretakers and with consultants  Document social determinants of health affecting pt's care  Document your decision making why or why not admission, treatments were needed:32947:::1}   Final diagnoses:  Alcohol-induced acute pancreatitis, unspecified complication status    ED Discharge Orders     None

## 2023-08-02 NOTE — ED Provider Notes (Signed)
 AP-EMERGENCY DEPT [facility] Emergency Department Provider Note MRN:  [id number]  Arrival date & time: 08/02/23     Chief Complaint   Abdominal Pain   History of Present Illness   Bryan Edwards is a 28 y.o. year-old male with a history of alcohol use disorder presenting to the ED with chief complaint of abdominal pain.  Epigastric abdominal pain with radiation to the right upper quadrant, right shoulder blade.  Associated with vomiting.  Possibly vomiting blood or something red.  Possibly having dark stool recently.  Review of Systems  A thorough review of systems was obtained and all systems are negative except as noted in the HPI and PMH.   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 No pertinent surgical history.  History reviewed. No pertinent family history.  Social History   Socioeconomic History   Marital status: Single    Spouse name: Not on file   Number of children: Not on file   Years of education: Not on file   Highest education level: Not on file  Occupational History   Not on file  Tobacco Use   Smoking status: Every Day    Current packs/day: 1.00    Types: Cigarettes   Smokeless tobacco: Not on file  Vaping Use   Vaping status: Never Used  Substance and Sexual Activity   Alcohol use: Yes    Comment: few times weekly   Drug use: No   Sexual activity: Not on file  Other Topics Concern   Not on file  Social History Narrative   Not on file   Social Drivers of Health   Financial Resource Strain: Not on file  Food Insecurity: No Food Insecurity (05/28/2023)   Hunger Vital Sign    Worried About Running Out of Food in the Last Year: Never true    Ran Out of Food in the Last Year: Never true  Transportation Needs: No Transportation Needs (05/28/2023)   PRAPARE - Administrator, Civil Service (Medical): No     Lack of Transportation (Non-Medical): No  Physical Activity: Not on file  Stress: Not on file  Social Connections: Not on file  Intimate Partner Violence: Not At Risk (05/28/2023)   Humiliation, Afraid, Rape, and Kick questionnaire    Fear of Current or Ex-Partner: No    Emotionally Abused: No    Physically Abused: No    Sexually Abused: No     Physical Exam   Vitals:   08/02/23 0230 08/02/23 0300  BP:    Pulse: 67 82  Resp:    Temp:    SpO2: 90% 98%    CONSTITUTIONAL: Well-appearing, NAD NEURO/PSYCH:  Alert and oriented x 3, no focal deficits EYES:  eyes equal and reactive ENT/NECK:  no LAD, no JVD CARDIO: Regular rate, well-perfused, normal S1 and S2 PULM:  CTAB no wheezing or rhonchi GI/GU:  non-distended, non-tender MSK/SPINE:  No gross deformities, no edema SKIN:  no rash, atraumatic   *Additional and/or pertinent findings included in MDM below  Diagnostic and Interventional Summary    EKG Interpretation Date/Time:    Ventricular Rate:    PR Interval:    QRS Duration:    QT Interval:    QTC Calculation:   R Axis:      Text Interpretation:  Labs Reviewed  LIPASE, BLOOD - Abnormal; Notable for the following components:      Result Value   Lipase 153 (*)    All other components within normal limits  COMPREHENSIVE METABOLIC PANEL WITH GFR - Abnormal; Notable for the following components:   Glucose, Bld 113 (*)    Creatinine, Ser 0.54 (*)    Anion gap 17 (*)    All other components within normal limits  URINALYSIS, ROUTINE W REFLEX MICROSCOPIC - Abnormal; Notable for the following components:   Color, Urine STRAW (*)    Specific Gravity, Urine 1.003 (*)    All other components within normal limits  CBC    CT ABDOMEN PELVIS W CONTRAST  Final Result      Medications  oxyCODONE  (Oxy IR/ROXICODONE ) immediate release tablet 5 mg (has no administration in time range)  ondansetron  (ZOFRAN -ODT) disintegrating tablet 4 mg (4 mg Oral Given 08/01/23  2210)  morphine  (PF) 4 MG/ML injection 4 mg (4 mg Intravenous Given 08/01/23 2347)  sodium chloride  0.9 % bolus 1,000 mL (0 mLs Intravenous Stopped 08/02/23 0112)  pantoprazole  (PROTONIX ) injection 40 mg (40 mg Intravenous Given 08/01/23 2346)  HYDROmorphone  (DILAUDID ) injection 1 mg (1 mg Intravenous Given 08/02/23 0018)  iohexol  (OMNIPAQUE ) 300 MG/ML solution 100 mL (100 mLs Intravenous Contrast Given 08/02/23 0026)     Procedures  /  Critical Care Procedures  ED Course and Medical Decision Making  Initial Impression and Ddx Patient continues to drink alcohol and continues to present with abdominal pain, question alcoholic gastritis versus pancreatitis.  Less likely gallbladder pathology.  Patient requesting CT scan, requesting Dilaudid .  Past medical/surgical history that increases complexity of ED encounter: Alcoholic pancreatitis  Interpretation of Diagnostics I personally reviewed the Laboratory Testing and my interpretation is as follows: No significant blood count or electrolyte disturbance.  Mild lipase elevation  CT unremarkable  Patient Reassessment and Ultimate Disposition/Management     Patient feeling better, we long discussion about how he needs to stop alcohol.  He agrees to try, no indication for further testing or admission, appropriate for discharge.  Patient management required discussion with the following services or consulting groups:  None  Complexity of Problems Addressed Acute illness or injury that poses threat of life of bodily function  Additional Data Reviewed and Analyzed Further history obtained from: Further history from spouse/family member  Additional Factors Impacting ED Encounter Risk Use of parenteral controlled substances and Consideration of hospitalization  Merrick Abe. Harless Lien, MD [facility] Emergency Medicine [facility] [email or web address]  Final Clinical Impressions(s) / ED Diagnoses     ICD-10-CM   1. Epigastric pain   R10.13       ED Discharge Orders          Ordered    chlordiazePOXIDE  (LIBRIUM ) 25 MG capsule        08/02/23 0314    omeprazole (PRILOSEC) 20 MG capsule  Daily        08/02/23 0315    sucralfate  (CARAFATE ) 1 g tablet  4 times daily PRN        08/02/23 0315             Discharge Instructions Discussed with and Provided to Patient:     Discharge Instructions      You were evaluated in the Emergency Department and after careful evaluation, we did not find any emergent condition requiring admission or further testing in the hospital.  Your exam/testing today is overall reassuring.  Symptoms likely due to  acid reflux triggered by alcohol use.  We discussed the importance of quitting alcohol.  Use the Librium  to detox at home as we discussed.  Use the omeprazole daily to prevent pain.  Use the Carafate  throughout the day for more immediate relief.  Please return to the Emergency Department if you experience any worsening of your condition.   Thank you for allowing us  to be a part of your care.       Edson Graces, MD 08/02/23 [phone]

## 2023-08-02 NOTE — H&P (Signed)
 History and Physical    Patient: Bryan Edwards WNU:[id number] DOB: Jun 13, 1995 DOA: 08/02/2023 DOS: the patient was seen and examined on 08/03/2023 PCP: Pcp, No  Patient coming from: Home  Chief Complaint:  Chief Complaint  Patient presents with   Abdominal Pain   HPI: Bryan Edwards is a 28 y.o. male with medical history significant of f  heavy alcohol abuse, tobacco abuse and alcoholic pancreatitis who presents to the emergency department due to 1 day onset of epigastric abdominal pain in the epigastric area with radiation to the back.  Patient states that last alcohol intake was on Friday (beer and few shots), he started to complain of abdominal pain on Saturday in the morning which worsened as the day progressed, so he presented to the ED yesterday during which lipase level was 153.  Patient endorsed vomiting PTA  Patient was admitted from 4/9 to 4/15 due to acute recurrent pancreatitis He was also admitted from 2/27 to 3/2 due to acute alcoholic pancreatitis   ED Course: In the emergency department, BP was 156/110, other vital signs were within normal range.  Workup in the ED showed normal CBC and BMP, urinalysis was normal, lipase was 700. CT abdomen and pelvis with contrast showed hepatic steatosis.  No acute or active process within the abdomen or pelvis. He was treated with IV hydration, IV Dilaudid  and morphine  was given.  Zofran  was also provided.  TR[facility] was asked to admit patient.  Review of Systems: Review of systems as noted in the HPI. All other systems reviewed and are negative.   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 No pertinent surgical history.  Social History:  reports that he has been smoking cigarettes. He does not have any smokeless tobacco history on file. He reports current alcohol use. He reports that he does not use drugs.   Allergies  Allergen Reactions    Other Other (See Comments)    Had a bad reaction to a shot in the stomach that was a blood thinner Sore R-thigh turned black and blue from shot   Amoxicillin Other (See Comments)    Makes tongue and throat hurt, dries it out   Bee Pollen Other (See Comments)    Seasonal allergies   Penicillins Swelling    History reviewed. No pertinent family history.   Prior to Admission medications   Medication Sig Start Date End Date Taking? Authorizing Provider  chlordiazePOXIDE  (LIBRIUM ) 25 MG capsule 50mg  PO TID x 1D, then 25-50mg  PO BID X 1D, then 25-50mg  PO QD X 1D 08/02/23   Edson Graces, MD  dicyclomine  (BENTYL ) 20 MG tablet Take 1 tablet (20 mg total) by mouth 2 (two) times daily. 06/21/23   Roselynn Connors, PA-C  famotidine  (PEPCID ) 20 MG tablet Take 1 tablet (20 mg total) by mouth 2 (two) times daily. 06/21/23   Roselynn Connors, PA-C  HYDROcodone -acetaminophen  (NORCO/VICODIN) 5-325 MG tablet Take 1 tablet by mouth every 6 (six) hours as needed. 06/13/23   Eldon Greenland, MD  omeprazole (PRILOSEC) 20 MG capsule Take 1 capsule (20 mg total) by mouth daily. 08/02/23   Edson Graces, MD  ondansetron  (ZOFRAN -ODT) 4 MG disintegrating tablet Take 1 tablet (4 mg total) by mouth every 8 (eight) hours as needed for nausea or vomiting. 06/21/23   Pearletha Bouche D, PA-C  sucralfate  (CARAFATE ) 1 g tablet Take 1 tablet (  1 g total) by mouth 4 (four) times daily as needed. 08/02/23   Edson Graces, MD    Physical Exam: BP (!) 147/114   Pulse 67   Temp 98.9 F (37.2 C) (Oral)   Resp 18   Ht 5' 10 (1.778 m)   Wt 64.2 kg   SpO2 100%   BMI 20.31 kg/m   General: 28 y.o. year-old male well developed well nourished in no acute distress.  Alert and oriented x3. HEENT: NCAT, EOMI Neck: Supple, trachea medial Cardiovascular: Regular rate and rhythm with no rubs or gallops.  No thyromegaly or JVD noted.  No lower extremity edema. 2/4 pulses in all 4 extremities. Respiratory: Clear to  auscultation with no wheezes or rales. Good inspiratory effort. Abdomen: Soft, tender to palpation without guarding in the epigastric area.  Nondistended with normal bowel sounds x4 quadrants. Muskuloskeletal: No cyanosis, clubbing or edema noted bilaterally Neuro: CN II-XII intact, strength 5/5 x 4, sensation, reflexes intact Skin: No ulcerative lesions noted or rashes Psychiatry: Judgement and insight appear normal. Mood is appropriate for condition and setting          Labs on Admission:  Basic Metabolic Panel: Recent Labs  Lab 08/01/23 2211 08/02/23 2116  NA 138 137  K 3.5 3.5  CL 99 98  CO2 22 26  GLUCOSE 113* 87  BUN 9 7  CREATININE 0.54* 0.55*  CALCIUM 9.6 9.9   Liver Function Tests: Recent Labs  Lab 08/01/23 2211 08/02/23 2116  AST 36 33  ALT 29 26  ALKPHOS 86 74  BILITOT 0.8 1.4*  PROT 7.7 7.4  ALBUMIN 5.0 5.0   Recent Labs  Lab 08/01/23 2211 08/02/23 2116  LIPASE 153* 700*   No results for input(s): AMMONIA in the last 168 hours. CBC: Recent Labs  Lab 08/01/23 2211 08/02/23 2116  WBC 8.8 9.9  NEUTROABS  --  7.0  HGB 16.8 16.1  HCT 48.3 44.8  MCV 91.1 90.5  PLT 196 162   Cardiac Enzymes: No results for input(s): CKTOTAL, CKMB, CKMBINDEX, TROPONINI in the last 168 hours.  BNP (last 3 results) No results for input(s): BNP in the last 8760 hours.  ProBNP (last 3 results) No results for input(s): PROBNP in the last 8760 hours.  CBG: No results for input(s): GLUCAP in the last 168 hours.  Radiological Exams on Admission: CT ABDOMEN PELVIS W CONTRAST Result Date: 08/02/2023 CLINICAL DATA:  Abdominal pain with nausea and vomiting. EXAM: CT ABDOMEN AND PELVIS WITH CONTRAST TECHNIQUE: Multidetector CT imaging of the abdomen and pelvis was performed using the standard protocol following bolus administration of intravenous contrast. RADIATION DOSE REDUCTION: This exam was performed according to the departmental dose-optimization  program which includes automated exposure control, adjustment of the mA and/or kV according to patient size and/or use of iterative reconstruction technique. CONTRAST:  OMNIPAQUE  IOHEXOL  300 MG/ML  SOLN COMPARISON:  Jun 21, 2023 FINDINGS: Lower chest: No acute abnormality. Hepatobiliary: There is diffuse fatty infiltration of the liver parenchyma. No focal liver abnormality is seen. No gallstones, gallbladder wall thickening, or biliary dilatation. Pancreas: Unremarkable. No pancreatic ductal dilatation or surrounding inflammatory changes. Spleen: Normal in size without focal abnormality. Adrenals/Urinary Tract: Adrenal glands are unremarkable. Kidneys are normal in size, without renal calculi or hydronephrosis. A stable subcentimeter cyst is seen within the posterior aspect of the mid right kidney. The urinary bladder is markedly distended and is otherwise unremarkable. Stomach/Bowel: Stomach is within normal limits. Appendix appears normal. No evidence of  bowel wall thickening, distention, or inflammatory changes. Vascular/Lymphatic: No significant vascular findings are present. No enlarged abdominal or pelvic lymph nodes. Reproductive: Prostate is unremarkable. Other: No abdominal wall hernia or abnormality. No abdominopelvic ascites. Musculoskeletal: No acute or significant osseous findings. IMPRESSION: 1. Hepatic steatosis. 2. No acute or active process within the abdomen or pelvis. Electronically Signed   By: Virgle Grime M.D.   On: 08/02/2023 00:38    EKG: I independently viewed the EKG done and my findings are as followed: EKG was not done in the ED  Assessment/Plan Present on Admission:  Acute recurrent pancreatitis  Alcohol abuse  Tobacco abuse  Principal Problem:   Acute recurrent pancreatitis Active Problems:   Alcohol abuse   Tobacco abuse   Nausea & vomiting  Acute recurrent pancreatitis Patient complained of abdominal pain typical of prior pancreatic attacks Lipase was  elevated at 700 Continue IV Zofran  p.r.n Continue IV Dilaudid  0.5 mg every 3 hours p.r.n for pain Continue Protonix  Continue IV LR at 100ml/Hr Continue full liquid diet with plan to advance diet as tolerated RUQ U/S in the morning to investigate biliary etiology (gallstone and bile duct dilatation)  Nausea and vomiting Continue Zofran  as needed  Alcohol abuse No signs of acute withdrawal.  Continue to monitor patient for alcohol withdrawal and consider starting him on CIWA protocol Patient was counseled on alcohol abuse cessation   Tobacco abuse He was advised on tobacco abuse cessation Nicotine  patch will be provided  DVT prophylaxis: Lovenox    Code Status: Full code  Family Communication: Mother at bedside (all questions answered to satisfaction)  Consults: None  Severity of Illness: The appropriate patient status for this patient is INPATIENT. Inpatient status is judged to be reasonable and necessary in order to provide the required intensity of service to ensure the patient's safety. The patient's presenting symptoms, physical exam findings, and initial radiographic and laboratory data in the context of their chronic comorbidities is felt to place them at high risk for further clinical deterioration. Furthermore, it is not anticipated that the patient will be medically stable for discharge from the hospital within 2 midnights of admission.   * I certify that at the point of admission it is my clinical judgment that the patient will require inpatient hospital care spanning beyond 2 midnights from the point of admission due to high intensity of service, high risk for further deterioration and high frequency of surveillance required.*  Author: Emelly Wurtz, DO 08/03/2023 2:50 AM  For on call review [email or web address].

## 2023-08-03 ENCOUNTER — Inpatient Hospital Stay (HOSPITAL_COMMUNITY)

## 2023-08-03 ENCOUNTER — Encounter (HOSPITAL_COMMUNITY): Payer: Self-pay | Admitting: Internal Medicine

## 2023-08-03 DIAGNOSIS — R112 Nausea with vomiting, unspecified: Secondary | ICD-10-CM | POA: Insufficient documentation

## 2023-08-03 DIAGNOSIS — K859 Acute pancreatitis without necrosis or infection, unspecified: Secondary | ICD-10-CM | POA: Diagnosis not present

## 2023-08-03 LAB — COMPREHENSIVE METABOLIC PANEL WITH GFR
ALT: 21 U/L (ref 0–44)
AST: 26 U/L (ref 15–41)
Albumin: 4.4 g/dL (ref 3.5–5.0)
Alkaline Phosphatase: 72 U/L (ref 38–126)
Anion gap: 11 (ref 5–15)
BUN: 7 mg/dL (ref 6–20)
CO2: 29 mmol/L (ref 22–32)
Calcium: 9.5 mg/dL (ref 8.9–10.3)
Chloride: 97 mmol/L — ABNORMAL LOW (ref 98–111)
Creatinine, Ser: 0.62 mg/dL (ref 0.61–1.24)
GFR, Estimated: >60 mL/min (ref >60)
Glucose, Bld: 81 mg/dL (ref 70–99)
Potassium: 3.2 mmol/L — ABNORMAL LOW (ref 3.5–5.1)
Sodium: 137 mmol/L (ref 135–145)
Total Bilirubin: 1.7 mg/dL — ABNORMAL HIGH (ref 0.0–1.2)
Total Protein: 6.8 g/dL (ref 6.5–8.1)

## 2023-08-03 LAB — CBC
HCT: 45.5 % (ref 39.0–52.0)
Hemoglobin: 15.5 g/dL (ref 13.0–17.0)
MCH: 31.8 pg (ref 26.0–34.0)
MCHC: 34.1 g/dL (ref 30.0–36.0)
MCV: 93.4 fL (ref 80.0–100.0)
Platelets: 142 10*3/uL — ABNORMAL LOW (ref 150–400)
RBC: 4.87 MIL/uL (ref 4.22–5.81)
RDW: 12.6 % (ref 11.5–15.5)
WBC: 7.9 10*3/uL (ref 4.0–10.5)
nRBC: 0 % (ref 0.0–0.2)

## 2023-08-03 LAB — LIPASE, BLOOD: Lipase: 700 U/L — ABNORMAL HIGH (ref 11–51)

## 2023-08-03 LAB — PHOSPHORUS: Phosphorus: 3.5 mg/dL (ref 2.5–4.6)

## 2023-08-03 LAB — MAGNESIUM: Magnesium: 1.8 mg/dL (ref 1.7–2.4)

## 2023-08-03 MED ORDER — ONDANSETRON HCL 4 MG PO TABS
4.0000 mg | ORAL_TABLET | Freq: Four times a day (QID) | ORAL | Status: DC | PRN
  Administered 2023-08-06 – 2023-08-07 (×3): 4 mg via ORAL
  Filled 2023-08-03 (×3): qty 1

## 2023-08-03 MED ORDER — NICOTINE 21 MG/24HR TD PT24
21.0000 mg | MEDICATED_PATCH | Freq: Every day | TRANSDERMAL | Status: DC
  Administered 2023-08-03 – 2023-08-07 (×5): 21 mg via TRANSDERMAL
  Filled 2023-08-03 (×5): qty 1

## 2023-08-03 MED ORDER — HYDRALAZINE HCL 20 MG/ML IJ SOLN: 10.0000 mg | Freq: Four times a day (QID) | INTRAMUSCULAR | Status: DC | PRN

## 2023-08-03 MED ORDER — PANTOPRAZOLE SODIUM 40 MG PO TBEC
40.0000 mg | DELAYED_RELEASE_TABLET | Freq: Every day | ORAL | Status: DC
  Administered 2023-08-03 – 2023-08-06 (×4): 40 mg via ORAL
  Filled 2023-08-03 (×4): qty 1

## 2023-08-03 MED ORDER — ACETAMINOPHEN 325 MG PO TABS: 650.0000 mg | ORAL_TABLET | Freq: Four times a day (QID) | ORAL | Status: DC | PRN

## 2023-08-03 MED ORDER — HYDROMORPHONE HCL 1 MG/ML IJ SOLN
0.5000 mg | INTRAMUSCULAR | Status: DC | PRN
  Administered 2023-08-03 (×3): 0.5 mg via INTRAVENOUS
  Filled 2023-08-03 (×3): qty 0.5

## 2023-08-03 MED ORDER — ONDANSETRON HCL 4 MG/2ML IJ SOLN
4.0000 mg | Freq: Four times a day (QID) | INTRAMUSCULAR | Status: DC | PRN
Start: 2023-08-03 — End: 2023-08-06
  Administered 2023-08-03 – 2023-08-06 (×10): 4 mg via INTRAVENOUS
  Filled 2023-08-03 (×10): qty 2

## 2023-08-03 MED ORDER — HYDROMORPHONE HCL 1 MG/ML IJ SOLN
0.5000 mg | INTRAMUSCULAR | Status: DC | PRN
  Administered 2023-08-03 (×2): 1 mg via INTRAVENOUS
  Administered 2023-08-03: 0.5 mg via INTRAVENOUS
  Administered 2023-08-03 – 2023-08-04 (×3): 1 mg via INTRAVENOUS
  Administered 2023-08-04: 0.5 mg via INTRAVENOUS
  Administered 2023-08-04 – 2023-08-05 (×7): 1 mg via INTRAVENOUS
  Administered 2023-08-05: 0.5 mg via INTRAVENOUS
  Administered 2023-08-06 (×3): 1 mg via INTRAVENOUS
  Filled 2023-08-03 (×6): qty 1
  Filled 2023-08-03: qty 0.5
  Filled 2023-08-03 (×2): qty 1
  Filled 2023-08-03: qty 0.5
  Filled 2023-08-03 (×8): qty 1

## 2023-08-03 MED ORDER — LACTATED RINGERS IV SOLN: INTRAVENOUS | Status: AC

## 2023-08-03 MED ORDER — POTASSIUM CHLORIDE CRYS ER 20 MEQ PO TBCR
40.0000 meq | EXTENDED_RELEASE_TABLET | Freq: Once | ORAL | Status: AC
  Administered 2023-08-03: 40 meq via ORAL
  Filled 2023-08-03: qty 2

## 2023-08-03 MED ORDER — ENOXAPARIN SODIUM 40 MG/0.4ML IJ SOSY
40.0000 mg | PREFILLED_SYRINGE | INTRAMUSCULAR | Status: DC
  Administered 2023-08-03 – 2023-08-06 (×4): 40 mg via SUBCUTANEOUS
  Filled 2023-08-03 (×4): qty 0.4

## 2023-08-03 MED ORDER — ACETAMINOPHEN 650 MG RE SUPP: 650.0000 mg | Freq: Four times a day (QID) | RECTAL | Status: DC | PRN

## 2023-08-03 NOTE — Discharge Instructions (Signed)

## 2023-08-03 NOTE — Plan of Care (Signed)
  Problem: Education: Goal: Knowledge of General Education information will improve Description: Including pain rating scale, medication(s)/side effects and non-pharmacologic comfort measures Outcome: Progressing   Problem: Pain Managment: Goal: General experience of comfort will improve and/or be controlled Outcome: Not Met (add Reason) Note: Patient needing dilaudid  Q3.

## 2023-08-03 NOTE — Progress Notes (Signed)
 PROGRESS NOTE  Bryan Edwards  UXL:[id number] DOB: 05/24/1995 DOA: 08/02/2023 PCP: Pcp, No  Consultants  Brief Narrative: 28 y.o. male with a PMH significant for heavy alcohol abuse, tobacco abuse and alcoholic pancreatitis who presents to the emergency department due to 1 day onset of epigastric abdominal pain in the epigastric area with radiation to the back.  Patient states that last alcohol intake was on Friday (beer and few shots), he started to complain of abdominal pain on Saturday in the morning which worsened as the day progressed, so he presented to the ED yesterday during which lipase level was 153.  Patient endorsed vomiting PTA   On note, patient was admitted from 4/9 to 4/15 and 2/27 - 3/2 due to acute recurrent alcoholic pancreatitis.   Assessment & Plan: Acute recurrent pancreatitis: Working dx based on abdominal pain typical of prior pancreatic attacks and elevated lipase to 700.   Still with some nausa/abd pain this AM.  Initially comfortable appearing but longer he spoke to me he became somewhat diaphoretic.  Otherwise no signs of distress.  Able to tolerate liquid diet thus far.  Initially tried grits but became nauseated and switched to liquid  Continue IV Zofran  p.r.n Continue IV Dilaudid  0.5-1 mg every 3 hours p.r.n for pain Continue Protonix  Continue IV LR at 128ml/Hr RUQ U/S pending to investigate biliary etiology (gallstone and bile duct dilatation)   Nausea and vomiting Continue Zofran  as needed   Alcohol abuse No signs of acute withdrawal.  Sweating as above, but from pain.  No tremulousness, others appeared comfortable Continue to monitor patient for alcohol withdrawal and consider starting him on CIWA protocol Patient was counseled on alcohol abuse cessation   Tobacco abuse He was advised on tobacco abuse cessation Nicotine  patch will be provided  Hypokalemia: - replaced this AM  Hyperbilirubinemia:  - RUQ US  pending  Thrombocytopenia: - mild,  trend    DVT prophylaxis:  enoxaparin  (LOVENOX ) injection 40 mg Start: 08/03/23 1000 SCDs Start: 08/03/23 0038  Code Status:   Code Status: Full Code Level of care: Med-Surg Status is: Inpatient Dispo:  pending resolution pancreatitis   Consults called: none   Subjective: Still c/o abd pain, better than at home but still present.  Tried grits but with increased nausea.  No vomiting since admission.    Objective: Vitals:   08/03/23 0159 08/03/23 0411 08/03/23 0502 08/03/23 0812  BP: (!) 147/114 (!) 135/92 (!) 144/96 (!) 160/116  Pulse: 67 69 62 75  Resp:  18    Temp:  97.8 F (36.6 C)    TempSrc:  Oral    SpO2:  99%  99%  Weight:      Height:        Intake/Output Summary (Last 24 hours) at 08/03/2023 0944 Last data filed at 08/03/2023 0348 Gross per 24 hour  Intake 1061.82 ml  Output --  Net 1061.82 ml   Filed Weights   08/02/23 2011 08/03/23 0003  Weight: 59 kg 64.2 kg   Body mass index is 20.31 kg/m.  Gen: 28 y.o. male in no apparent distress.  Nontoxic Pulm: Non-labored breathing.  Clear to auscultation bilaterally.  CV: Regular rate and rhythm. No murmur, rub, or gallop. No JVD GI: Abdomen soft, ND.  TTP epigastrum and BL upper quadrants, good BS Ext: Warm, no deformities, no pedal edema Skin: No rashes, lesions no ulcers Neuro: Alert and oriented. No focal neurological deficits. Psych: Calm  Judgement and insight appear normal. Mood & affect appropriate.  I have personally reviewed the following labs and images: CBC: Recent Labs  Lab 08/01/23 2211 08/02/23 2116 08/03/23 0312  WBC 8.8 9.9 7.9  NEUTROABS  --  7.0  --   HGB 16.8 16.1 15.5  HCT 48.3 44.8 45.5  MCV 91.1 90.5 93.4  PLT 196 162 142*   BMP &GFR Recent Labs  Lab 08/01/23 2211 08/02/23 2116 08/03/23 0312  NA 138 137 137  K 3.5 3.5 3.2*  CL 99 98 97*  CO2 22 26 29   GLUCOSE 113* 87 81  BUN 9 7 7   CREATININE 0.54* 0.55* 0.62  CALCIUM 9.6 9.9 9.5  MG  --   --  1.8  PHOS  --    --  3.5   Estimated Creatinine Clearance: 123.7 mL/min (by C-G formula based on SCr of 0.62 mg/dL). Liver & Pancreas: Recent Labs  Lab 08/01/23 2211 08/02/23 2116 08/03/23 0312  AST 36 33 26  ALT 29 26 21   ALKPHOS 86 74 72  BILITOT 0.8 1.4* 1.7*  PROT 7.7 7.4 6.8  ALBUMIN 5.0 5.0 4.4   Recent Labs  Lab 08/01/23 2211 08/02/23 2116  LIPASE 153* 700*   No results for input(s): AMMONIA in the last 168 hours. Diabetic: No results for input(s): HGBA1C in the last 72 hours. No results for input(s): GLUCAP in the last 168 hours. Cardiac Enzymes: No results for input(s): CKTOTAL, CKMB, CKMBINDEX, TROPONINI in the last 168 hours. No results for input(s): PROBNP in the last 8760 hours. Coagulation Profile: No results for input(s): INR, PROTIME in the last 168 hours. Thyroid Function Tests: No results for input(s): TSH, T4TOTAL, FREET4, T3FREE, THYROIDAB in the last 72 hours. Lipid Profile: No results for input(s): CHOL, HDL, LDLCALC, TRIG, CHOLHDL, LDLDIRECT in the last 72 hours. Anemia Panel: No results for input(s): VITAMINB12, FOLATE, FERRITIN, TIBC, IRON, RETICCTPCT in the last 72 hours. Urine analysis:    Component Value Date/Time   COLORURINE COLORLESS (A) 08/02/2023 2116   APPEARANCEUR CLEAR 08/02/2023 2116   LABSPEC 1.002 (L) 08/02/2023 2116   PHURINE 7.0 08/02/2023 2116   GLUCOSEU NEGATIVE 08/02/2023 2116   HGBUR NEGATIVE 08/02/2023 2116   BILIRUBINUR NEGATIVE 08/02/2023 2116   KETONESUR NEGATIVE 08/02/2023 2116   PROTEINUR NEGATIVE 08/02/2023 2116   NITRITE NEGATIVE 08/02/2023 2116   LEUKOCYTESUR SMALL (A) 08/02/2023 2116   Sepsis Labs: Invalid input(s): PROCALCITONIN, LACTICIDVEN  Microbiology: No results found for this or any previous visit (from the past 240 hours).  Radiology Studies: No results found.  Scheduled Meds:  enoxaparin  (LOVENOX ) injection  40 mg Subcutaneous Q24H   nicotine   21 mg  Transdermal Daily   pantoprazole   40 mg Oral Daily   Continuous Infusions:  lactated ringers  100 mL/hr at 08/03/23 0308     LOS: 1 day   35 minutes with more than 50% spent in reviewing records, counseling patient/family and coordinating care.  Trenton Frock, MD [facility] [email or web address] 08/03/2023, 9:44 AM

## 2023-08-03 NOTE — Plan of Care (Signed)

## 2023-08-03 NOTE — TOC Initial Note (Signed)
 Transition of Care [facility]) - Initial/Assessment Note    Patient Details  Name: Bryan Edwards MRN: [id number] Date of Birth: [birth year]  Transition [facility]) CM/SW Contact:    Grandville Lax, LCSWA Phone Number: 08/03/2023, 9:09 AM  Clinical Narrative:                 Pt is high risk for readmission. CSW notes per chart review that pt has no PCP listed, PCP list added to AVS for pt to review at D/C. Substance use resources added to AVS for pt to review. Pt has insurance as is able to get meds from pharmacy as needed. TOC to follow.   Expected Discharge Plan: Home/Self Care Barriers to Discharge: No Barriers Identified   Patient Goals and CMS Choice Patient states their goals for this hospitalization and ongoing recovery are:: return home CMS Medicare.gov Compare Post Acute Care list provided to:: Patient Choice offered to / list presented to : Patient      Expected Discharge Plan and Services In-house Referral: Clinical Social Work Discharge Planning Services: CM Consult   Living arrangements for the past 2 months: Single Family Home                                      Prior Living Arrangements/Services Living arrangements for the past 2 months: Single Family Home Lives with:: Self Patient language and need for interpreter reviewed:: Yes Do you feel safe going back to the place where you live?: Yes      Need for Family Participation in Patient Care: Yes (Comment) Care giver support system in place?: Yes (comment)   Criminal Activity/Legal Involvement Pertinent to Current Situation/Hospitalization: No - Comment as needed  Activities of Daily Living   ADL Screening (condition at time of admission) Independently performs ADLs?: Yes (appropriate for developmental age) Is the patient deaf or have difficulty hearing?: No Does the patient have difficulty seeing, even when wearing glasses/contacts?: No Does the patient have difficulty concentrating, remembering,  or making decisions?: No  Permission Sought/Granted                  Emotional Assessment Appearance:: Appears stated age Attitude/Demeanor/Rapport: Engaged Affect (typically observed): Accepting Orientation: : Oriented to Self, Oriented to Place, Oriented to  Time, Oriented to Situation Alcohol / Substance Use: Not Applicable Psych Involvement: No (comment)  Admission diagnosis:  Pancreatitis [K85.90] Alcohol-induced acute pancreatitis, unspecified complication status [K85.20] Patient Active Problem List   Diagnosis Date Noted   Nausea & vomiting 08/03/2023   Pancreatitis 08/02/2023   Acute recurrent pancreatitis 05/28/2023   Hypokalemia 05/28/2023   Abdominal pain, epigastric 04/18/2023   Intussusception (HCC) 04/17/2023   Alcohol abuse 04/17/2023   Acute alcoholic pancreatitis 04/16/2023   Alcohol withdrawal (HCC) 01/09/2023   Tobacco abuse 01/09/2023   PCP:  Pcp, No Pharmacy:   [id number] - [place], [place] - [address] AT [address] [place] [address] Phone: [phone] Fax: [phone]  [id number] - [place], [place] - [address] AT [facility] [address] [place] [address] Phone: [phone] Fax: [phone]     Social Drivers of Health (SDOH) Social History: SDOH Screenings   Food Insecurity: No Food Insecurity (08/03/2023)  Housing: Low Risk  (08/03/2023)  Transportation Needs: No Transportation Needs (08/03/2023)  Utilities: Not At Risk (08/03/2023)  Tobacco Use: High Risk (08/03/2023)   SDOH Interventions:     Readmission Risk Interventions    08/03/2023    9:08 AM  Readmission Risk Prevention Plan  Transportation Screening Complete  HRI or Home Care Consult Complete  Social Work Consult for Recovery Care Planning/Counseling Complete  Palliative Care Screening Not Applicable  Medication Review Oceanographer) Complete

## 2023-08-03 NOTE — Plan of Care (Signed)
  Problem: Education: Goal: Knowledge of General Education information will improve Description: Including pain rating scale, medication(s)/side effects and non-pharmacologic comfort measures 08/03/2023 0622 by Austin Leff, RN Outcome: Progressing 08/03/2023 0622 by Austin Leff, RN Outcome: Progressing   Problem: Clinical Measurements: Goal: Ability to maintain clinical measurements within normal limits will improve 08/03/2023 0622 by Austin Leff, RN Outcome: Progressing 08/03/2023 0622 by Austin Leff, RN Outcome: Progressing

## 2023-08-04 ENCOUNTER — Inpatient Hospital Stay (HOSPITAL_COMMUNITY)

## 2023-08-04 DIAGNOSIS — E872 Acidosis, unspecified: Secondary | ICD-10-CM

## 2023-08-04 DIAGNOSIS — K838 Other specified diseases of biliary tract: Secondary | ICD-10-CM

## 2023-08-04 DIAGNOSIS — R748 Abnormal levels of other serum enzymes: Secondary | ICD-10-CM

## 2023-08-04 DIAGNOSIS — F17210 Nicotine dependence, cigarettes, uncomplicated: Secondary | ICD-10-CM

## 2023-08-04 DIAGNOSIS — K859 Acute pancreatitis without necrosis or infection, unspecified: Secondary | ICD-10-CM | POA: Diagnosis not present

## 2023-08-04 DIAGNOSIS — F1090 Alcohol use, unspecified, uncomplicated: Secondary | ICD-10-CM

## 2023-08-04 DIAGNOSIS — K760 Fatty (change of) liver, not elsewhere classified: Secondary | ICD-10-CM

## 2023-08-04 LAB — CBC
HCT: 44.5 % (ref 39.0–52.0)
Hemoglobin: 15.5 g/dL (ref 13.0–17.0)
MCH: 32.7 pg (ref 26.0–34.0)
MCHC: 34.8 g/dL (ref 30.0–36.0)
MCV: 93.9 fL (ref 80.0–100.0)
Platelets: 128 10*3/uL — ABNORMAL LOW (ref 150–400)
RBC: 4.74 MIL/uL (ref 4.22–5.81)
RDW: 12.2 % (ref 11.5–15.5)
WBC: 7.7 10*3/uL (ref 4.0–10.5)
nRBC: 0 % (ref 0.0–0.2)

## 2023-08-04 LAB — BASIC METABOLIC PANEL WITH GFR
Anion gap: 13 (ref 5–15)
BUN: 6 mg/dL (ref 6–20)
CO2: 28 mmol/L (ref 22–32)
Calcium: 9.5 mg/dL (ref 8.9–10.3)
Chloride: 94 mmol/L — ABNORMAL LOW (ref 98–111)
Creatinine, Ser: 0.69 mg/dL (ref 0.61–1.24)
GFR, Estimated: >60 mL/min (ref >60)
Glucose, Bld: 74 mg/dL (ref 70–99)
Potassium: 4 mmol/L (ref 3.5–5.1)
Sodium: 135 mmol/L (ref 135–145)

## 2023-08-04 MED ORDER — GADOBUTROL 1 MMOL/ML IV SOLN
6.5000 mL | Freq: Once | INTRAVENOUS | Status: AC | PRN
  Administered 2023-08-04: 6.5 mL via INTRAVENOUS

## 2023-08-04 NOTE — Plan of Care (Signed)

## 2023-08-04 NOTE — Progress Notes (Signed)
 PROGRESS NOTE  Bryan Edwards  NFA:[id number] DOB: [birth year] DOA: 08/02/2023 PCP: Pcp, No  Consultants  Brief Narrative: 28 y.o. male with a PMH significant for heavy alcohol abuse, tobacco abuse and alcoholic pancreatitis who presents to the emergency department due to 1 day onset of epigastric abdominal pain in the epigastric area with radiation to the back.  Patient states that last alcohol intake was on Friday (beer and few shots), he started to complain of abdominal pain on Saturday in the morning which worsened as the day progressed, so he presented to the ED yesterday during which lipase level was 153.  Patient endorsed vomiting PTA   On note, patient was admitted from 4/9 to 4/15 and 2/27 - 3/2 due to acute recurrent alcoholic pancreatitis.   Assessment & Plan: Acute recurrent pancreatitis: Still with some pain today.   A little better but not much.  Tried grits this AM, but pain worsened again.  RUQ from yesterday revealed dilated gallbladder and dilated CBD with + Murphy's sign on sonography. Working dx has been EtOH induced pancreatitis, but he reports cutting back and now with RUQ US  findings, will pursue work-up for possible gallstone induce.   HIDA vs MRCP, will reach out to GI to ask for next best steps and consultation.  Continue pain control and anti-emetics.     Nausea and vomiting Continue Zofran  as needed   Alcohol abuse No signs of acute withdrawal.  Reports cutting back.   See pancreatitis above.     Tobacco abuse He was advised on tobacco abuse cessation Nicotine  patch will be provided  Hypokalemia: - resolved  Hyperbilirubinemia:  - will trend, RUQ showed dilated gallbladder and CBD  Thrombocytopenia: - downtrending.  On lovenox , will trend.      DVT prophylaxis:  enoxaparin  (LOVENOX ) injection 40 mg Start: 08/03/23 1000 SCDs Start: 08/03/23 0038  Code Status:   Code Status: Full Code Level of care: Med-Surg Status is: Inpatient Dispo:  pending  resolution pancreatitis  Consults called: none   Subjective: Still c/o abd pain, about the same as yesterday.  Tried grits this AM, but pain resumed.  No vomiting.      Objective: Vitals:   08/03/23 1229 08/03/23 1933 08/04/23 0404 08/04/23 0811  BP: (!) 138/99 (!) 138/97 (!) 130/99 (!) 160/124  Pulse: 61 77 64 66  Resp: 16 16 16    Temp: 98 F (36.7 C) 98 F (36.7 C) 98.4 F (36.9 C)   TempSrc: Oral Oral Oral   SpO2: 98% 99% 98% 100%  Weight:      Height:       No intake or output data in the 24 hours ending 08/04/23 1239  Filed Weights   08/02/23 2011 08/03/23 0003  Weight: 59 kg 64.2 kg   Body mass index is 20.31 kg/m.  Gen: 28 y.o. male in no apparent distress.  Nontoxic Pulm: Non-labored breathing.  Clear to auscultation bilaterally.  CV: Regular rate and rhythm. No murmur, rub, or gallop. No JVD GI: Abdomen soft, ND.  Still notably TTP epigastrum and BL upper quadrants, good BS Ext: Warm, no deformities, no pedal edema Skin: No rashes, lesions no ulcers Neuro: Alert and oriented. No focal neurological deficits. Psych: Calm  Judgement and insight appear normal. Mood & affect appropriate.     I have personally reviewed the following labs and images: CBC: Recent Labs  Lab 08/01/23 2211 08/02/23 2116 08/03/23 0312 08/04/23 0418  WBC 8.8 9.9 7.9 7.7  NEUTROABS  --  7.0  --   --  HGB 16.8 16.1 15.5 15.5  HCT 48.3 44.8 45.5 44.5  MCV 91.1 90.5 93.4 93.9  PLT 196 162 142* 128*   BMP &GFR Recent Labs  Lab 08/01/23 2211 08/02/23 2116 08/03/23 0312 08/04/23 0418  NA 138 137 137 135  K 3.5 3.5 3.2* 4.0  CL 99 98 97* 94*  CO2 22 26 29 28   GLUCOSE 113* 87 81 74  BUN 9 7 7 6   CREATININE 0.54* 0.55* 0.62 0.69  CALCIUM 9.6 9.9 9.5 9.5  MG  --   --  1.8  --   PHOS  --   --  3.5  --    Estimated Creatinine Clearance: 123.7 mL/min (by C-G formula based on SCr of 0.69 mg/dL). Liver & Pancreas: Recent Labs  Lab 08/01/23 2211 08/02/23 2116 08/03/23 0312   AST 36 33 26  ALT 29 26 21   ALKPHOS 86 74 72  BILITOT 0.8 1.4* 1.7*  PROT 7.7 7.4 6.8  ALBUMIN 5.0 5.0 4.4   Recent Labs  Lab 08/01/23 2211 08/02/23 2116  LIPASE 153* 700*   No results for input(s): AMMONIA in the last 168 hours. Diabetic: No results for input(s): HGBA1C in the last 72 hours. No results for input(s): GLUCAP in the last 168 hours. Cardiac Enzymes: No results for input(s): CKTOTAL, CKMB, CKMBINDEX, TROPONINI in the last 168 hours. No results for input(s): PROBNP in the last 8760 hours. Coagulation Profile: No results for input(s): INR, PROTIME in the last 168 hours. Thyroid Function Tests: No results for input(s): TSH, T4TOTAL, FREET4, T3FREE, THYROIDAB in the last 72 hours. Lipid Profile: No results for input(s): CHOL, HDL, LDLCALC, TRIG, CHOLHDL, LDLDIRECT in the last 72 hours. Anemia Panel: No results for input(s): VITAMINB12, FOLATE, FERRITIN, TIBC, IRON, RETICCTPCT in the last 72 hours. Urine analysis:    Component Value Date/Time   COLORURINE COLORLESS (A) 08/02/2023 2116   APPEARANCEUR CLEAR 08/02/2023 2116   LABSPEC 1.002 (L) 08/02/2023 2116   PHURINE 7.0 08/02/2023 2116   GLUCOSEU NEGATIVE 08/02/2023 2116   HGBUR NEGATIVE 08/02/2023 2116   BILIRUBINUR NEGATIVE 08/02/2023 2116   KETONESUR NEGATIVE 08/02/2023 2116   PROTEINUR NEGATIVE 08/02/2023 2116   NITRITE NEGATIVE 08/02/2023 2116   LEUKOCYTESUR SMALL (A) 08/02/2023 2116   Sepsis Labs: Invalid input(s): PROCALCITONIN, LACTICIDVEN  Microbiology: No results found for this or any previous visit (from the past 240 hours).  Radiology Studies: No results found.  Scheduled Meds:  enoxaparin  (LOVENOX ) injection  40 mg Subcutaneous Q24H   nicotine   21 mg Transdermal Daily   pantoprazole   40 mg Oral Daily   Continuous Infusions:     LOS: 2 days   35 minutes with more than 50% spent in reviewing records, counseling patient/family  and coordinating care.  Trenton Frock, MD [facility] [email or web address] 08/04/2023, 12:39 PM

## 2023-08-04 NOTE — Progress Notes (Deleted)
 Pt refused lab work. Stated she felt she did not need her labs taken and we can use the blood she gave from yesterday to figure out what is wrong with her. Educated pt on the purpose of the labs, pt cont'd to refuse.

## 2023-08-04 NOTE — Consult Note (Signed)
 @LOGO @   Referring Provider: [facility] Primary Care Physician:  Pcp, No Primary Gastroenterologist:  Dr. Riley Cheadle  Date of Admission: 08/02/23 Date of Consultation: 08/04/23  Reason for Consultation:  Pancreatitis, CBD dilation  HPI:  Bryan Edwards is a 28 y.o. 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   He was found to have lipase of 700, normal LFTs, mildly elevated bilirubin of 1.4.  CBC within normal limits.  CT with unremarkable pancreas, hepatic steatosis, normal gallbladder and biliary tree.  He was admitted with recurrent pancreatitis and given IV fluids, IV antiemetics and analgesics.  RUQ ultrasound completed yesterday showing mildly dilated CBD of 7 mm and dilated gallbladder, but no wall thickening, adjacent fluid, or stones.  GI consulted for further evaluation.   Consult: Patient reports intermittent abdominal pain starting Monday a week ago, but kept getting progressively worse. Started epigastric but began radiating to RUQ and to his right flank/back.   Pain about 9/10 without pain medications.   Associated intermittent nausea/vomiting.   Some mixed black stool last week. Possibly slight blood in emesis, but may have been from his gums.   Prior to this, would have intermittent very short-lived epigastric abdominal pain.   Since admission and receiving pain medications and antiemetics, he is tolerating a clear liquid diet as long as he takes it slow. Pain is managed.   No alcohol since last Friday. States he stopped for a little while after his initial diagnosis of pancreatitis, then started drinking intermittently again. For the last 6 months may drink every day for a week, then nothing for a week or 2.  Would be a few beer and some shots, but sometimes heavy drinking >10 beer and >10 shots of  liquor per day.   Smokes 1-2 packs a day.   No illicit drug use.   Rare NSAID use. Maybe a few times a year.   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 No pertinent surgical history.  Prior to Admission medications   Medication Sig Start Date End Date Taking? Authorizing Provider  ondansetron  (ZOFRAN -ODT) 4 MG disintegrating tablet Take 1 tablet (4 mg total) by mouth every 8 (eight) hours as needed for nausea or vomiting. 06/21/23  Yes Pearletha Bouche D, PA-C  chlordiazePOXIDE  (LIBRIUM ) 25 MG capsule 50mg  PO TID x 1D, then 25-50mg  PO BID X 1D, then 25-50mg  PO QD X 1D 08/02/23   Edson Graces, MD  dicyclomine  (BENTYL ) 20 MG tablet Take 1 tablet (20 mg total) by mouth 2 (two) times daily. Patient not taking: Reported on 08/03/2023 06/21/23   Roselynn Connors, PA-C  omeprazole (PRILOSEC) 20 MG capsule Take 1 capsule (20 mg total) by mouth daily. 08/02/23   Edson Graces, MD  sucralfate  (CARAFATE ) 1 g tablet Take 1 tablet (1 g total) by mouth 4 (four) times daily as needed. Patient not taking: Reported on 08/03/2023 08/02/23   Edson Graces, MD    Current Facility-Administered Medications  Medication Dose Route Frequency Provider Last Rate Last Admin   acetaminophen  (TYLENOL ) tablet 650 mg  650 mg Oral Q6H PRN Adefeso, Oladapo, DO       Or   acetaminophen  (TYLENOL ) suppository 650 mg  650 mg  Rectal Q6H PRN Adefeso, Oladapo, DO       enoxaparin  (LOVENOX ) injection 40 mg  40 mg Subcutaneous Q24H Adefeso, Oladapo, DO   40 mg at 08/04/23 0816   hydrALAZINE  (APRESOLINE ) injection 10 mg  10 mg Intravenous Q6H PRN Adefeso, Oladapo, DO       HYDROmorphone  (DILAUDID ) injection 0.5-1 mg  0.5-1 mg Intravenous Q3H PRN Walden, Jeffrey H, MD   1 mg at 08/04/23 1251   nicotine  (NICODERM CQ  - dosed in mg/24 hours) patch 21 mg  21 mg Transdermal Daily Adefeso, Oladapo, DO   21 mg at 08/04/23 0816    ondansetron  (ZOFRAN ) tablet 4 mg  4 mg Oral Q6H PRN Adefeso, Oladapo, DO       Or   ondansetron  (ZOFRAN ) injection 4 mg  4 mg Intravenous Q6H PRN Adefeso, Oladapo, DO   4 mg at 08/04/23 8295   pantoprazole  (PROTONIX ) EC tablet 40 mg  40 mg Oral Daily Adefeso, Oladapo, DO   40 mg at 08/04/23 0816    Allergies as of 08/02/2023 - Review Complete 08/02/2023  Allergen Reaction Noted   Other Other (See Comments) 05/28/2023   Amoxicillin Other (See Comments) 10/25/2011   Bee pollen Other (See Comments) 01/09/2023   Penicillins Swelling 01/09/2023    History reviewed. No pertinent family history.  Social History   Socioeconomic History   Marital status: Single    Spouse name: Not on file   Number of children: Not on file   Years of education: Not on file   Highest education level: Not on file  Occupational History   Not on file  Tobacco Use   Smoking status: Every Day    Current packs/day: 1.00    Types: Cigarettes   Smokeless tobacco: Not on file  Vaping Use   Vaping status: Never Used  Substance and Sexual Activity   Alcohol use: Yes    Comment: few times weekly   Drug use: No   Sexual activity: Not on file  Other Topics Concern   Not on file  Social History Narrative   Not on file   Social Drivers of Health   Financial Resource Strain: Not on file  Food Insecurity: No Food Insecurity (08/03/2023)   Hunger Vital Sign    Worried About Running Out of Food in the Last Year: Never true    Ran Out of Food in the Last Year: Never true  Transportation Needs: No Transportation Needs (08/03/2023)   PRAPARE - Administrator, Civil Service (Medical): No    Lack of Transportation (Non-Medical): No  Physical Activity: Not on file  Stress: Not on file  Social Connections: Not on file  Intimate Partner Violence: Unknown (08/03/2023)   Humiliation, Afraid, Rape, and Kick questionnaire    Fear of Current or Ex-Partner: Not on file    Emotionally Abused: Not on file     Physically Abused: No    Sexually Abused: No    Review of Systems: Gen: Denies fever, chills, cold or flulike symptoms, presyncope, syncope. CV: Denies chest pain, heart palpitations. Resp: Denies shortness of breath, cough. GI: See HPI GU : Denies urinary burning, urinary frequency, urinary incontinence.  MS: Denies joint pain. Derm: Denies rash. Psych: Denies depression, anxiety. Heme: See HPI  Physical Exam: Vital signs in last 24 hours: Temp:  [98 F (36.7 C)-98.4 F (36.9 C)] 98.4 F (36.9 C) (06/17 0404) Pulse Rate:  [64-77] 66 (06/17 0811) Resp:  [16] 16 (06/17 0404) BP: (130-160)/(97-124) 160/124 (  06/17 0811) SpO2:  [98 %-100 %] 100 % (06/17 0811) Last BM Date : 08/03/23 General:   Alert,  Well-developed, well-nourished, pleasant and cooperative in NAD Head:  Normocephalic and atraumatic. Eyes:  Sclera clear, no icterus.   Conjunctiva pink. Ears:  Normal auditory acuity. Nose:  No deformity, discharge,  or lesions. Mouth:  No deformity or lesions, dentition normal. Neck:  Supple; no masses or thyromegaly. Lungs:  Clear throughout to auscultation.   No wheezes, crackles, or rhonchi. No acute distress. Heart:  Regular rate and rhythm; no murmurs, clicks, rubs,  or gallops. Abdomen:  Soft, nontender and nondistended. No masses, hepatosplenomegaly or hernias noted. Normal bowel sounds, without guarding, and without rebound.   Rectal:  Deferred until time of colonoscopy.   Msk:  Symmetrical without gross deformities. Normal posture. Pulses:  Normal pulses noted. Extremities:  Without clubbing or edema. Neurologic:  Alert and  oriented x4;  grossly normal neurologically. Skin:  Intact without significant lesions or rashes. Cervical Nodes:  No significant cervical adenopathy. Psych:  Alert and cooperative. Normal mood and affect.  Intake/Output from previous day: No intake/output data recorded. Intake/Output this shift: No intake/output data recorded.  Lab  Results: Recent Labs    08/02/23 2116 08/03/23 0312 08/04/23 0418  WBC 9.9 7.9 7.7  HGB 16.1 15.5 15.5  HCT 44.8 45.5 44.5  PLT 162 142* 128*   BMET Recent Labs    08/02/23 2116 08/03/23 0312 08/04/23 0418  NA 137 137 135  K 3.5 3.2* 4.0  CL 98 97* 94*  CO2 26 29 28   GLUCOSE 87 81 74  BUN 7 7 6   CREATININE 0.55* 0.62 0.69  CALCIUM 9.9 9.5 9.5   LFT Recent Labs    08/01/23 2211 08/02/23 2116 08/03/23 0312  PROT 7.7 7.4 6.8  ALBUMIN 5.0 5.0 4.4  AST 36 33 26  ALT 29 26 21   ALKPHOS 86 74 72  BILITOT 0.8 1.4* 1.7*   Studies/Results: US  Abdomen Limited Result Date: 08/03/2023 CLINICAL DATA:  Pancreatitis EXAM: ULTRASOUND ABDOMEN LIMITED RIGHT UPPER QUADRANT COMPARISON:  Ultrasound 05/28/2023 and older. CT 08/02/2023 and older. FINDINGS: Gallbladder: Dilated gallbladder. No wall thickening, adjacent fluid or shadowing stones. There are sock for does report pain when scanning the gallbladder. Common bile duct: Diameter: 7 mm, dilated Liver: Echogenic hepatic parenchyma consistent with fatty liver infiltration. The portal vein is patent on color Doppler imaging with normal direction of blood flow towards the liver. Other: None. IMPRESSION: Fatty liver infiltration. Dilated gallbladder without stones. However there is a dilated common duct and the sonographer per reports pain when scanning the gallbladder. With these findings would recommend further evaluation such as HIDA scan or MRI. Electronically Signed   By: Adrianna Horde M.D.   On: 08/03/2023 11:16    Impression: 28 y.o. 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   Acute pancreatitis/CBD dilation:  Acute pancreatitis diagnosed based on elevated lipase of 700 and classic symptoms of epigastric pain, nausea, and vomiting in  conjunction with ongoing ETOH use. Etiology of pancreatitis likely secondary to ongoing ETOH use, but considering RUQ pain radiating to his back and mild CBD dilation of 7 mm on ultrasound, will rule out biliary process with MRI/MRCP though I have low suspicion for this in the setting of normal LFTs.  His bilirubin was  very mildly elevated at 1.7 yesterday.  We will repeat HFP to ensure LFTs have remained normal and fractionate bilirubin.  Clinically, patient is improving slowly. Tolerating liquid diet. Pain managed with analgesics. No complicating features on CT.    Plan: HFP MRI/MRCP to evaluate biliary dilation. Continue clear liquid diet. Continue IV antiemetics and analgesics as needed Counseled on the importance of strict alcohol abstinence. Counseled on smoking cessation.   LOS: 2 days    08/04/2023, 1:43 PM   Shana Daring, [facility]

## 2023-08-04 NOTE — Plan of Care (Signed)
  Problem: Clinical Measurements: Goal: Will remain free from infection Outcome: Progressing Goal: Cardiovascular complication will be avoided Outcome: Progressing   Problem: Activity: Goal: Risk for activity intolerance will decrease Outcome: Progressing   Problem: Coping: Goal: Level of anxiety will decrease Outcome: Progressing   Problem: Elimination: Goal: Will not experience complications related to urinary retention Outcome: Progressing   Problem: Pain Managment: Goal: General experience of comfort will improve and/or be controlled Outcome: Progressing   Problem: Safety: Goal: Ability to remain free from injury will improve Outcome: Progressing   Problem: Skin Integrity: Goal: Risk for impaired skin integrity will decrease Outcome: Progressing

## 2023-08-05 DIAGNOSIS — K859 Acute pancreatitis without necrosis or infection, unspecified: Secondary | ICD-10-CM | POA: Diagnosis not present

## 2023-08-05 MED ORDER — POLYETHYLENE GLYCOL 3350 17 G PO PACK: 17.0000 g | PACK | Freq: Two times a day (BID) | ORAL | Status: DC | PRN

## 2023-08-05 NOTE — Progress Notes (Signed)
 This RN attests to student documentation.  Timmothy Baranowski V. Ligaya Cormier, MSN-RN Clinical Instructor/Nursing Faculty [facility]

## 2023-08-05 NOTE — Progress Notes (Signed)
 Subjective: Sates pain had been between a 9 and 10, was 8/10 this mornings. He ordered lunch, states he is unsure how it will go eating solids but he is going to try it. He was still having to do smaller sips of liquids thus far. He had some nausea this morning that went away but returned after he ate grits and drank some milk. No BMs since Sunday morning.   Objective: Vital signs in last 24 hours: Temp:  [97.4 F (36.3 C)-97.8 F (36.6 C)] 97.8 F (36.6 C) (06/18 0745) Pulse Rate:  [48-68] 68 (06/18 0745) Resp:  [16-18] 18 (06/18 0323) BP: (120-132)/(84-91) 124/89 (06/18 0745) SpO2:  [100 %] 100 % (06/18 0745) Last BM Date : 08/03/23 General:   Alert and oriented, pleasant Head:  Normocephalic and atraumatic. Eyes:  No icterus, sclera clear. Conjuctiva pink.  Mouth:  Without lesions, mucosa pink and moist.  Lungs: Clear to auscultation bilaterally, without wheezing, rales, or rhonchi.  Abdomen:  Bowel sounds present, soft, non-distended. No HSM or hernias noted. No rebound or guarding. No masses appreciated. +TTP of epigastric region.  Neurologic:  Alert and  oriented x4;  grossly normal neurologically. Cervical Nodes:  No significant cervical adenopathy. Psych:  Alert and cooperative. Normal mood and affect.  Intake/Output from previous day: 06/17 0701 - 06/18 0700 In: 480 [P.O.:480] Out: -  Intake/Output this shift: Total I/O In: 460 [P.O.:460] Out: -   Lab Results: Recent Labs    08/02/23 2116 08/03/23 0312 08/04/23 0418  WBC 9.9 7.9 7.7  HGB 16.1 15.5 15.5  HCT 44.8 45.5 44.5  PLT 162 142* 128*   BMET Recent Labs    08/02/23 2116 08/03/23 0312 08/04/23 0418  NA 137 137 135  K 3.5 3.2* 4.0  CL 98 97* 94*  CO2 26 29 28   GLUCOSE 87 81 74  BUN 7 7 6   CREATININE 0.55* 0.62 0.69  CALCIUM 9.9 9.5 9.5   LFT Recent Labs    08/02/23 2116 08/03/23 0312  PROT 7.4 6.8  ALBUMIN 5.0 4.4  AST 33 26  ALT 26 21  ALKPHOS 74 72  BILITOT 1.4* 1.7*     Studies/Results: MR 3D Recon At Scanner Result Date: 08/04/2023 EXAM: MRCP WITH AND WITHOUT IV CONTRAST 08/04/2023 06:42:43 PM TECHNIQUE: Multisequence, multiplanar magnetic resonance images of the abdomen with and without intravenous contrast. MRCP sequences were performed. COMPARISON: CT abdomen/pelvis dated 08/02/2023. CLINICAL HISTORY: Pancreatitis. FINDINGS: LIVER: Liver is within normal limits. No hepatic steatosis. GALLBLADDER AND BILIARY SYSTEM: Gallbladder is unremarkable. No intrahepatic or extrahepatic ductal dilation. No choledocholithiasis. SPLEEN: Unremarkable. PANCREAS/PANCREATIC DUCT: No peripancreatic fluid or inflammatory changes in this patient with acute pancreatitis. Visualized pancreas is otherwise unremarkable. No pancreatic ductal dilatation or divisum. ADRENAL GLANDS: Unremarkable. KIDNEYS: Unremarkable. LYMPH NODES: No enlarged abdominal lymph nodes. VASULATURE: Unremarkable. PERITONEUM: No ascites. ABDOMINAL WALL: No hernia. No mass. BOWEL: Normal appendix (series 4/image 34). Grossly unremarkable. No bowel obstruction. BONES: No acute abnormality or worrisome osseous lesion. SOFT TISSUES: Unremarkable. MISCELLANEOUS: Unremarkable. IMPRESSION: 1. No acute findings of pancreatitis. 2. Negative MR abdomen. Electronically signed by: Zadie Herter MD 08/04/2023 07:32 PM EDT RP Workstation: [id number]   MR ABDOMEN MRCP W WO CONTAST Result Date: 08/04/2023 EXAM: MRCP WITH AND WITHOUT IV CONTRAST 08/04/2023 06:42:43 PM TECHNIQUE: Multisequence, multiplanar magnetic resonance images of the abdomen with and without intravenous contrast. MRCP sequences were performed. COMPARISON: CT abdomen/pelvis dated 08/02/2023. CLINICAL HISTORY: Pancreatitis. FINDINGS: LIVER: Liver is within normal limits. No hepatic steatosis. GALLBLADDER AND BILIARY  SYSTEM: Gallbladder is unremarkable. No intrahepatic or extrahepatic ductal dilation. No choledocholithiasis. SPLEEN: Unremarkable.  PANCREAS/PANCREATIC DUCT: No peripancreatic fluid or inflammatory changes in this patient with acute pancreatitis. Visualized pancreas is otherwise unremarkable. No pancreatic ductal dilatation or divisum. ADRENAL GLANDS: Unremarkable. KIDNEYS: Unremarkable. LYMPH NODES: No enlarged abdominal lymph nodes. VASULATURE: Unremarkable. PERITONEUM: No ascites. ABDOMINAL WALL: No hernia. No mass. BOWEL: Normal appendix (series 4/image 34). Grossly unremarkable. No bowel obstruction. BONES: No acute abnormality or worrisome osseous lesion. SOFT TISSUES: Unremarkable. MISCELLANEOUS: Unremarkable. IMPRESSION: 1. No acute findings of pancreatitis. 2. Negative MR abdomen. Electronically signed by: Zadie Herter MD 08/04/2023 07:32 PM EDT RP Workstation: [id number]   US  Abdomen Limited Result Date: 08/03/2023 CLINICAL DATA:  Pancreatitis EXAM: ULTRASOUND ABDOMEN LIMITED RIGHT UPPER QUADRANT COMPARISON:  Ultrasound 05/28/2023 and older. CT 08/02/2023 and older. FINDINGS: Gallbladder: Dilated gallbladder. No wall thickening, adjacent fluid or shadowing stones. There are sock for does report pain when scanning the gallbladder. Common bile duct: Diameter: 7 mm, dilated Liver: Echogenic hepatic parenchyma consistent with fatty liver infiltration. The portal vein is patent on color Doppler imaging with normal direction of blood flow towards the liver. Other: None. IMPRESSION: Fatty liver infiltration. Dilated gallbladder without stones. However there is a dilated common duct and the sonographer per reports pain when scanning the gallbladder. With these findings would recommend further evaluation such as HIDA scan or MRI. Electronically Signed   By: Adrianna Horde M.D.   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 GI was consulted due to concern of possible biliary obstruction.   Acute pancreatitis: -Lipase 700 on admission -Epigastric pain, nausea, vomiting -Reports ongoing ETOH (regular daily drinking x6 months) -smoker, 1-2 PPD -CT A/P: hepatic steatosis -RUQ US  Fatty liver infiltration. Dilated gallbladder without stones, dilated common duct and the sonographer,reports pain when scanning the gallbladder -MRCP due to CBD dilation: negative -Last LFTs on 6/16 normal other than T bili 1.7 -WBC remains WNL at 7.7 -repeat LFTs today  Reassuringly, patient is improving clinically, tolerating liquid diet. Diet was advanced to soft by hospitalist in hopes of possible discharge if tolerated, Will see how he does with this. May very well need to remain on liquids for a few more days, pending clinical course    Plan: -Repeat CMP -Continue anti emetics, analgesics as needed -Strict ETOH avoidance -Smoking cessation  -trial of soft diet today, step back down to liquids if not tolerating    LOS: 3 days    08/05/2023, 8:42 AM   Ronnika Collett L. Tess Potts, MSN, APRN, AGNP-C Adult-Gerontology Nurse Practitioner [facility]

## 2023-08-05 NOTE — Progress Notes (Signed)
 PROGRESS NOTE  Bryan Edwards  ZOX:[id number] DOB: [birth year] DOA: 08/02/2023 PCP: Pcp, No  Consultants  Brief Narrative: 28 y.o. male with a PMH significant for heavy alcohol abuse, tobacco abuse and alcoholic pancreatitis who presents to the emergency department due to 1 day onset of epigastric abdominal pain in the epigastric area with radiation to the back.  Patient states that last alcohol intake was on Friday (beer and few shots), he started to complain of abdominal pain on Saturday in the morning which worsened as the day progressed, so he presented to the ED yesterday during which lipase level was 153.  Patient endorsed vomiting PTA   On note, patient was admitted from 4/9 to 4/15 and 2/27 - 3/2 due to acute recurrent alcoholic pancreatitis.   Assessment & Plan: Acute recurrent pancreatitis: - Pain better today than has been.  He was eating grits when I was in the room with him.  - GI consulted yesterday due to concerns for biliary cause of pancreatitis - Negative MRCP, making EtOH more likely cause of pancreatitis - continue pain control.  Advance diet to softs.   - If able to tolerate more solid food for lunch, possibly able to DC home this PM.     Nausea and vomiting Continue Zofran  as needed, no further vomiting   Alcohol abuse No signs of acute withdrawal.  Reports cutting back to me, though sounds like based on GI note that EtOH use has remained heavy.   See pancreatitis above.     Tobacco abuse He was advised on tobacco abuse cessation Nicotine  patch will be provided  Hypokalemia: - resolved  Hyperbilirubinemia:  - will trend, RUQ showed dilated gallbladder and CBD  Thrombocytopenia: - downtrending.  On lovenox , will trend.      DVT prophylaxis:  enoxaparin  (LOVENOX ) injection 40 mg Start: 08/03/23 1000 SCDs Start: 08/03/23 0038  Code Status:   Code Status: Full Code Level of care: Med-Surg Status is: Inpatient Dispo:  pending resolution  pancreatitis  Consults called: GI   Subjective: Still c/o abd pain, but better than has been.  Reassured that he didn't have any other intra-abdominal issues on MRCP yesterday.  Hungry.  No vomiting.      Objective: Vitals:   08/04/23 1300 08/04/23 1944 08/05/23 0323 08/05/23 0745  BP: (!) 132/91 (!) 120/91 129/84 124/89  Pulse: 66 66 (!) 48 68  Resp: 16 18 18    Temp: 97.7 F (36.5 C) 97.7 F (36.5 C) (!) 97.4 F (36.3 C) 97.8 F (36.6 C)  TempSrc: Oral Oral Oral Oral  SpO2: 100% 100% 100% 100%  Weight:      Height:        Intake/Output Summary (Last 24 hours) at 08/05/2023 0939 Last data filed at 08/05/2023 0800 Gross per 24 hour  Intake 940 ml  Output --  Net 940 ml    Filed Weights   08/02/23 2011 08/03/23 0003  Weight: 59 kg 64.2 kg   Body mass index is 20.31 kg/m.  Gen: 28 y.o. male in no apparent distress.  Nontoxic Pulm: Non-labored breathing.  Clear to auscultation bilaterally.  CV: Regular rate and rhythm. No murmur, rub, or gallop. No JVD GI: Abdomen soft, ND.  Still TTP epigastrum, but less so than has been prior exams, no guarding or rebound.  Ext: Warm, no deformities, no pedal edema Skin: No rashes, lesions no ulcers Neuro: Alert and oriented. No focal neurological deficits. Psych: Calm  Judgement and insight appear normal. Mood & affect appropriate.  I have personally reviewed the following labs and images: CBC: Recent Labs  Lab 08/01/23 2211 08/02/23 2116 08/03/23 0312 08/04/23 0418  WBC 8.8 9.9 7.9 7.7  NEUTROABS  --  7.0  --   --   HGB 16.8 16.1 15.5 15.5  HCT 48.3 44.8 45.5 44.5  MCV 91.1 90.5 93.4 93.9  PLT 196 162 142* 128*   BMP &GFR Recent Labs  Lab 08/01/23 2211 08/02/23 2116 08/03/23 0312 08/04/23 0418  NA 138 137 137 135  K 3.5 3.5 3.2* 4.0  CL 99 98 97* 94*  CO2 22 26 29 28   GLUCOSE 113* 87 81 74  BUN 9 7 7 6   CREATININE 0.54* 0.55* 0.62 0.69  CALCIUM 9.6 9.9 9.5 9.5  MG  --   --  1.8  --   PHOS  --   --  3.5   --    Estimated Creatinine Clearance: 123.7 mL/min (by C-G formula based on SCr of 0.69 mg/dL). Liver & Pancreas: Recent Labs  Lab 08/01/23 2211 08/02/23 2116 08/03/23 0312  AST 36 33 26  ALT 29 26 21   ALKPHOS 86 74 72  BILITOT 0.8 1.4* 1.7*  PROT 7.7 7.4 6.8  ALBUMIN 5.0 5.0 4.4   Recent Labs  Lab 08/01/23 2211 08/02/23 2116  LIPASE 153* 700*   No results for input(s): AMMONIA in the last 168 hours. Diabetic: No results for input(s): HGBA1C in the last 72 hours. No results for input(s): GLUCAP in the last 168 hours. Cardiac Enzymes: No results for input(s): CKTOTAL, CKMB, CKMBINDEX, TROPONINI in the last 168 hours. No results for input(s): PROBNP in the last 8760 hours. Coagulation Profile: No results for input(s): INR, PROTIME in the last 168 hours. Thyroid Function Tests: No results for input(s): TSH, T4TOTAL, FREET4, T3FREE, THYROIDAB in the last 72 hours. Lipid Profile: No results for input(s): CHOL, HDL, LDLCALC, TRIG, CHOLHDL, LDLDIRECT in the last 72 hours. Anemia Panel: No results for input(s): VITAMINB12, FOLATE, FERRITIN, TIBC, IRON, RETICCTPCT in the last 72 hours. Urine analysis:    Component Value Date/Time   COLORURINE COLORLESS (A) 08/02/2023 2116   APPEARANCEUR CLEAR 08/02/2023 2116   LABSPEC 1.002 (L) 08/02/2023 2116   PHURINE 7.0 08/02/2023 2116   GLUCOSEU NEGATIVE 08/02/2023 2116   HGBUR NEGATIVE 08/02/2023 2116   BILIRUBINUR NEGATIVE 08/02/2023 2116   KETONESUR NEGATIVE 08/02/2023 2116   PROTEINUR NEGATIVE 08/02/2023 2116   NITRITE NEGATIVE 08/02/2023 2116   LEUKOCYTESUR SMALL (A) 08/02/2023 2116   Sepsis Labs: Invalid input(s): PROCALCITONIN, LACTICIDVEN  Microbiology: No results found for this or any previous visit (from the past 240 hours).  Radiology Studies: MR 3D Recon At Scanner Result Date: 08/04/2023 EXAM: MRCP WITH AND WITHOUT IV CONTRAST 08/04/2023 06:42:43 PM  TECHNIQUE: Multisequence, multiplanar magnetic resonance images of the abdomen with and without intravenous contrast. MRCP sequences were performed. COMPARISON: CT abdomen/pelvis dated 08/02/2023. CLINICAL HISTORY: Pancreatitis. FINDINGS: LIVER: Liver is within normal limits. No hepatic steatosis. GALLBLADDER AND BILIARY SYSTEM: Gallbladder is unremarkable. No intrahepatic or extrahepatic ductal dilation. No choledocholithiasis. SPLEEN: Unremarkable. PANCREAS/PANCREATIC DUCT: No peripancreatic fluid or inflammatory changes in this patient with acute pancreatitis. Visualized pancreas is otherwise unremarkable. No pancreatic ductal dilatation or divisum. ADRENAL GLANDS: Unremarkable. KIDNEYS: Unremarkable. LYMPH NODES: No enlarged abdominal lymph nodes. VASULATURE: Unremarkable. PERITONEUM: No ascites. ABDOMINAL WALL: No hernia. No mass. BOWEL: Normal appendix (series 4/image 34). Grossly unremarkable. No bowel obstruction. BONES: No acute abnormality or worrisome osseous lesion. SOFT TISSUES: Unremarkable. MISCELLANEOUS: Unremarkable. IMPRESSION: 1. No acute  findings of pancreatitis. 2. Negative MR abdomen. Electronically signed by: Zadie Herter MD 08/04/2023 07:32 PM EDT RP Workstation: [id number]   MR ABDOMEN MRCP W WO CONTAST Result Date: 08/04/2023 EXAM: MRCP WITH AND WITHOUT IV CONTRAST 08/04/2023 06:42:43 PM TECHNIQUE: Multisequence, multiplanar magnetic resonance images of the abdomen with and without intravenous contrast. MRCP sequences were performed. COMPARISON: CT abdomen/pelvis dated 08/02/2023. CLINICAL HISTORY: Pancreatitis. FINDINGS: LIVER: Liver is within normal limits. No hepatic steatosis. GALLBLADDER AND BILIARY SYSTEM: Gallbladder is unremarkable. No intrahepatic or extrahepatic ductal dilation. No choledocholithiasis. SPLEEN: Unremarkable. PANCREAS/PANCREATIC DUCT: No peripancreatic fluid or inflammatory changes in this patient with acute pancreatitis. Visualized pancreas is otherwise  unremarkable. No pancreatic ductal dilatation or divisum. ADRENAL GLANDS: Unremarkable. KIDNEYS: Unremarkable. LYMPH NODES: No enlarged abdominal lymph nodes. VASULATURE: Unremarkable. PERITONEUM: No ascites. ABDOMINAL WALL: No hernia. No mass. BOWEL: Normal appendix (series 4/image 34). Grossly unremarkable. No bowel obstruction. BONES: No acute abnormality or worrisome osseous lesion. SOFT TISSUES: Unremarkable. MISCELLANEOUS: Unremarkable. IMPRESSION: 1. No acute findings of pancreatitis. 2. Negative MR abdomen. Electronically signed by: Zadie Herter MD 08/04/2023 07:32 PM EDT RP Workstation: [id number]    Scheduled Meds:  enoxaparin  (LOVENOX ) injection  40 mg Subcutaneous Q24H   nicotine   21 mg Transdermal Daily   pantoprazole   40 mg Oral Daily   Continuous Infusions:     LOS: 3 days   35 minutes with more than 50% spent in reviewing records, counseling patient/family and coordinating care.  Trenton Frock, MD [facility] [email or web address] 08/05/2023, 9:39 AM

## 2023-08-05 NOTE — Plan of Care (Signed)

## 2023-08-05 NOTE — Plan of Care (Signed)
  Problem: Clinical Measurements: Goal: Ability to maintain clinical measurements within normal limits will improve Outcome: Progressing Goal: Will remain free from infection Outcome: Progressing Goal: Diagnostic test results will improve Outcome: Progressing   Problem: Activity: Goal: Risk for activity intolerance will decrease Outcome: Progressing   Problem: Coping: Goal: Level of anxiety will decrease Outcome: Progressing   Problem: Elimination: Goal: Will not experience complications related to bowel motility Outcome: Progressing Goal: Will not experience complications related to urinary retention Outcome: Progressing   Problem: Pain Managment: Goal: General experience of comfort will improve and/or be controlled Outcome: Progressing   Problem: Safety: Goal: Ability to remain free from injury will improve Outcome: Progressing   Problem: Skin Integrity: Goal: Risk for impaired skin integrity will decrease Outcome: Progressing

## 2023-08-06 DIAGNOSIS — K859 Acute pancreatitis without necrosis or infection, unspecified: Secondary | ICD-10-CM | POA: Diagnosis not present

## 2023-08-06 LAB — BASIC METABOLIC PANEL WITH GFR
Anion gap: 13 (ref 5–15)
BUN: 6 mg/dL (ref 6–20)
CO2: 25 mmol/L (ref 22–32)
Calcium: 9.2 mg/dL (ref 8.9–10.3)
Chloride: 98 mmol/L (ref 98–111)
Creatinine, Ser: 0.7 mg/dL (ref 0.61–1.24)
GFR, Estimated: >60 mL/min (ref >60)
Glucose, Bld: 79 mg/dL (ref 70–99)
Potassium: 3.5 mmol/L (ref 3.5–5.1)
Sodium: 136 mmol/L (ref 135–145)

## 2023-08-06 LAB — CBC
HCT: 41.7 % (ref 39.0–52.0)
Hemoglobin: 14 g/dL (ref 13.0–17.0)
MCH: 31.3 pg (ref 26.0–34.0)
MCHC: 33.6 g/dL (ref 30.0–36.0)
MCV: 93.1 fL (ref 80.0–100.0)
Platelets: 114 10*3/uL — ABNORMAL LOW (ref 150–400)
RBC: 4.48 MIL/uL (ref 4.22–5.81)
RDW: 11.7 % (ref 11.5–15.5)
WBC: 5.5 10*3/uL (ref 4.0–10.5)
nRBC: 0 % (ref 0.0–0.2)

## 2023-08-06 MED ORDER — LACTATED RINGERS IV SOLN: INTRAVENOUS | Status: AC

## 2023-08-06 MED ORDER — PANTOPRAZOLE SODIUM 40 MG PO TBEC
40.0000 mg | DELAYED_RELEASE_TABLET | Freq: Two times a day (BID) | ORAL | Status: DC
  Administered 2023-08-06 – 2023-08-07 (×2): 40 mg via ORAL
  Filled 2023-08-06 (×2): qty 1

## 2023-08-06 MED ORDER — PNEUMOCOCCAL 20-VAL CONJ VACC 0.5 ML IM SUSY: 0.5000 mL | PREFILLED_SYRINGE | INTRAMUSCULAR | Status: DC

## 2023-08-06 MED ORDER — HYDROMORPHONE HCL 2 MG PO TABS
2.0000 mg | ORAL_TABLET | ORAL | Status: DC | PRN
  Administered 2023-08-06: 2 mg via ORAL
  Filled 2023-08-06: qty 1

## 2023-08-06 MED ORDER — HYDROMORPHONE HCL 1 MG/ML IJ SOLN
0.5000 mg | INTRAMUSCULAR | Status: DC | PRN
  Administered 2023-08-06 (×3): 1 mg via INTRAVENOUS
  Administered 2023-08-07: 0.5 mg via INTRAVENOUS
  Administered 2023-08-07 (×2): 1 mg via INTRAVENOUS
  Administered 2023-08-07: 0.5 mg via INTRAVENOUS
  Filled 2023-08-06: qty 0.5
  Filled 2023-08-06 (×5): qty 1
  Filled 2023-08-06: qty 0.5

## 2023-08-06 NOTE — Progress Notes (Signed)
 PROGRESS NOTE  Bryan Edwards  ZOX:[id number] DOB: Dec 16, 1995 DOA: 08/02/2023 PCP: Pcp, No  Consultants  Brief Narrative: 28 y.o. male with a PMH significant for heavy alcohol abuse, tobacco abuse and alcoholic pancreatitis who presents to the emergency department due to 1 day onset of epigastric abdominal pain in the epigastric area with radiation to the back.  Patient states that last alcohol intake was on Friday (beer and few shots), he started to complain of abdominal pain on Saturday in the morning which worsened as the day progressed, so he presented to the ED yesterday during which lipase level was 153.  Patient endorsed vomiting PTA   On note, patient was admitted from 4/9 to 4/15 and 2/27 - 3/2 due to acute recurrent alcoholic pancreatitis.  Has been slowly improving while in house.    Assessment & Plan: Acute recurrent pancreatitis: - Pain better today than has been.  He had a plate of eggs in front of him but had not yet started eating when I saw him.   - No further nausea.  No vomiting.  Still rates pain as 9-10/10 at it's worst, right before receiving pain medications.  - GI consulted due to concerns for biliary cause of pancreatitis, appreciate input.   - Negative MRCP, making EtOH more likely cause of pancreatitis - continue pain control.  Continue soft diet.   - Plan is to switch to PO pain and nausea meds today.  If able to tolerate them, could DC home later this PM.  If pain recurs, would switch to IV and try again tomorrow.  - ambulate and OOB today   Nausea and vomiting Continue Zofran  as needed, no further vomiting.  Controlled.  Thrombocytopenia: - continues downtrending.  114 today, down from 196 on admit.  251 last month.   - On lovenox , will DC.  Start SCDs as he's not out bed much.  Needs to ambulate.   - Concern of course for HIT, less likely but still possible with lovenox .  Continue to trend platelets.     Alcohol abuse No signs of acute withdrawal.   See  pancreatitis above.     Tobacco abuse He was advised on tobacco abuse cessation Nicotine  patch will be provided  Hypokalemia: - resolved  Hyperbilirubinemia:  - will trend, RUQ showed dilated gallbladder and CBD      DVT prophylaxis:  Place and maintain sequential compression device Start: 08/06/23 0923 SCDs Start: 08/03/23 0038  Code Status:   Code Status: Full Code Level of care: Med-Surg Status is: Inpatient Dispo:  pending resolution pancreatitis  Consults called: GI   Subjective: Still c/o abd pain, better today, longer periods w/out pain but still reports as 9-10/10.  No vomiting.  Only eating a few bites of food at a time.       Objective: Vitals:   08/05/23 2015 08/05/23 2024 08/06/23 0056 08/06/23 0347  BP: (!) 133/96 (!) 133/96 131/87 125/87  Pulse: 61 61 60 61  Resp: 18 18  18   Temp: 98.1 F (36.7 C) 98.1 F (36.7 C)  (!) 97.4 F (36.3 C)  TempSrc: Oral Oral  Oral  SpO2: 100% 100%  99%  Weight:      Height:        Intake/Output Summary (Last 24 hours) at 08/06/2023 0935 Last data filed at 08/06/2023 0354 Gross per 24 hour  Intake 840 ml  Output --  Net 840 ml    Filed Weights   08/02/23 2011 08/03/23 0003  Weight: 59  kg 64.2 kg   Body mass index is 20.31 kg/m.  Gen: 28 y.o. male in no apparent distress.  Nontoxic Pulm: Non-labored breathing.  Clear to auscultation bilaterally.  CV: Regular rate and rhythm. No murmur, rub, or gallop. No JVD GI: Abdomen soft, ND.  Still TTP epigastrum,  no guarding or rebound.  Good BS throughout Ext: Warm, no deformities, no pedal edema Skin: No rashes, lesions no ulcers Neuro: Alert and oriented. No focal neurological deficits. Psych: Calm  Judgement and insight appear normal. Mood & affect appropriate.     I have personally reviewed the following labs and images: CBC: Recent Labs  Lab 08/01/23 2211 08/02/23 2116 08/03/23 0312 08/04/23 0418 08/06/23 0406  WBC 8.8 9.9 7.9 7.7 5.5  NEUTROABS  --   7.0  --   --   --   HGB 16.8 16.1 15.5 15.5 14.0  HCT 48.3 44.8 45.5 44.5 41.7  MCV 91.1 90.5 93.4 93.9 93.1  PLT 196 162 142* 128* 114*   BMP &GFR Recent Labs  Lab 08/01/23 2211 08/02/23 2116 08/03/23 0312 08/04/23 0418 08/06/23 0406  NA 138 137 137 135 136  K 3.5 3.5 3.2* 4.0 3.5  CL 99 98 97* 94* 98  CO2 22 26 29 28 25   GLUCOSE 113* 87 81 74 79  BUN 9 7 7 6 6   CREATININE 0.54* 0.55* 0.62 0.69 0.70  CALCIUM 9.6 9.9 9.5 9.5 9.2  MG  --   --  1.8  --   --   PHOS  --   --  3.5  --   --    Estimated Creatinine Clearance: 123.7 mL/min (by C-G formula based on SCr of 0.7 mg/dL). Liver & Pancreas: Recent Labs  Lab 08/01/23 2211 08/02/23 2116 08/03/23 0312  AST 36 33 26  ALT 29 26 21   ALKPHOS 86 74 72  BILITOT 0.8 1.4* 1.7*  PROT 7.7 7.4 6.8  ALBUMIN 5.0 5.0 4.4   Recent Labs  Lab 08/01/23 2211 08/02/23 2116  LIPASE 153* 700*   No results for input(s): AMMONIA in the last 168 hours. Diabetic: No results for input(s): HGBA1C in the last 72 hours. No results for input(s): GLUCAP in the last 168 hours. Cardiac Enzymes: No results for input(s): CKTOTAL, CKMB, CKMBINDEX, TROPONINI in the last 168 hours. No results for input(s): PROBNP in the last 8760 hours. Coagulation Profile: No results for input(s): INR, PROTIME in the last 168 hours. Thyroid Function Tests: No results for input(s): TSH, T4TOTAL, FREET4, T3FREE, THYROIDAB in the last 72 hours. Lipid Profile: No results for input(s): CHOL, HDL, LDLCALC, TRIG, CHOLHDL, LDLDIRECT in the last 72 hours. Anemia Panel: No results for input(s): VITAMINB12, FOLATE, FERRITIN, TIBC, IRON, RETICCTPCT in the last 72 hours. Urine analysis:    Component Value Date/Time   COLORURINE COLORLESS (A) 08/02/2023 2116   APPEARANCEUR CLEAR 08/02/2023 2116   LABSPEC 1.002 (L) 08/02/2023 2116   PHURINE 7.0 08/02/2023 2116   GLUCOSEU NEGATIVE 08/02/2023 2116   HGBUR NEGATIVE  08/02/2023 2116   BILIRUBINUR NEGATIVE 08/02/2023 2116   KETONESUR NEGATIVE 08/02/2023 2116   PROTEINUR NEGATIVE 08/02/2023 2116   NITRITE NEGATIVE 08/02/2023 2116   LEUKOCYTESUR SMALL (A) 08/02/2023 2116   Sepsis Labs: Invalid input(s): PROCALCITONIN, LACTICIDVEN  Microbiology: No results found for this or any previous visit (from the past 240 hours).  Radiology Studies: No results found.   Scheduled Meds:  nicotine   21 mg Transdermal Daily   pantoprazole   40 mg Oral Daily  Continuous Infusions:     LOS: 4 days   35 minutes with more than 50% spent in reviewing records, counseling patient/family and coordinating care.  Trenton Frock, MD [facility] [email or web address] 08/06/2023, 9:35 AM

## 2023-08-06 NOTE — Progress Notes (Addendum)
 Gastroenterology Progress Note   Referring Provider: No ref. provider found Primary Care Physician:  Pcp, No Primary Gastroenterologist:  Rheba Cedar, MD Patient ID: Bryan Edwards; [id number]; Apr 12, 1995   Subjective:    States he could not get his pain medication on time yesterday afternoon. Made his pain worse. Required more often during the night. Received Dilaudid  1mg  every 3-4 hours over night. States his pain still at least a 8-9. Worse with meals but at least no vomiting. Still with nausea. States he is mostly eating clear liquid and bites of soft food. BM normal today.  Objective:   Vital signs in last 24 hours: Temp:  [97.4 F (36.3 C)-98.1 F (36.7 C)] 97.6 F (36.4 C) (06/19 1212) Pulse Rate:  [56-61] 56 (06/19 1212) Resp:  [17-18] 17 (06/19 1212) BP: (124-133)/(83-96) 124/89 (06/19 1212) SpO2:  [99 %-100 %] 100 % (06/19 1212) Last BM Date : 08/03/23 General:   Alert,  Well-developed, well-nourished, pleasant and cooperative in NAD Head:  Normocephalic and atraumatic. Eyes:  Sclera clear, no icterus.   Abdomen:  Soft,  nondistended. No masses, hepatosplenomegaly or hernias noted. Normal bowel sounds, without guarding, and without rebound.  Mild epigastric/ruq pain. Extremities:  Without clubbing, deformity or edema. Neurologic:  Alert and  oriented x4;  grossly normal neurologically. Skin:  Intact without significant lesions or rashes. Psych:  Alert and cooperative. Normal mood and affect.  Intake/Output from previous day: 06/18 0701 - 06/19 0700 In: 1300 [P.O.:1300] Out: -  Intake/Output this shift: No intake/output data recorded.  Lab Results: CBC Recent Labs    08/04/23 0418 08/06/23 0406  WBC 7.7 5.5  HGB 15.5 14.0  HCT 44.5 41.7  MCV 93.9 93.1  PLT 128* 114*   BMET Recent Labs    08/04/23 0418 08/06/23 0406  NA 135 136  K 4.0 3.5  CL 94* 98  CO2 28 25  GLUCOSE 74 79  BUN 6 6  CREATININE 0.69 0.70  CALCIUM 9.5 9.2   LFTs No results  for input(s): BILITOT, BILIDIR, IBILI, ALKPHOS, AST, ALT, PROT, ALBUMIN in the last 72 hours. No results for input(s): LIPASE in the last 72 hours. PT/INR No results for input(s): LABPROT, INR in the last 72 hours.       Imaging Studies: MR 3D Recon At Scanner Result Date: 08/04/2023 EXAM: MRCP WITH AND WITHOUT IV CONTRAST 08/04/2023 06:42:43 PM TECHNIQUE: Multisequence, multiplanar magnetic resonance images of the abdomen with and without intravenous contrast. MRCP sequences were performed. COMPARISON: CT abdomen/pelvis dated 08/02/2023. CLINICAL HISTORY: Pancreatitis. FINDINGS: LIVER: Liver is within normal limits. No hepatic steatosis. GALLBLADDER AND BILIARY SYSTEM: Gallbladder is unremarkable. No intrahepatic or extrahepatic ductal dilation. No choledocholithiasis. SPLEEN: Unremarkable. PANCREAS/PANCREATIC DUCT: No peripancreatic fluid or inflammatory changes in this patient with acute pancreatitis. Visualized pancreas is otherwise unremarkable. No pancreatic ductal dilatation or divisum. ADRENAL GLANDS: Unremarkable. KIDNEYS: Unremarkable. LYMPH NODES: No enlarged abdominal lymph nodes. VASULATURE: Unremarkable. PERITONEUM: No ascites. ABDOMINAL WALL: No hernia. No mass. BOWEL: Normal appendix (series 4/image 34). Grossly unremarkable. No bowel obstruction. BONES: No acute abnormality or worrisome osseous lesion. SOFT TISSUES: Unremarkable. MISCELLANEOUS: Unremarkable. IMPRESSION: 1. No acute findings of pancreatitis. 2. Negative MR abdomen. Electronically signed by: Zadie Herter MD 08/04/2023 07:32 PM EDT RP Workstation: [id number]   MR ABDOMEN MRCP W WO CONTAST Result Date: 08/04/2023 EXAM: MRCP WITH AND WITHOUT IV CONTRAST 08/04/2023 06:42:43 PM TECHNIQUE: Multisequence, multiplanar magnetic resonance images of the abdomen with and without intravenous contrast. MRCP sequences were performed. COMPARISON: CT abdomen/pelvis  dated 08/02/2023. CLINICAL HISTORY: Pancreatitis.  FINDINGS: LIVER: Liver is within normal limits. No hepatic steatosis. GALLBLADDER AND BILIARY SYSTEM: Gallbladder is unremarkable. No intrahepatic or extrahepatic ductal dilation. No choledocholithiasis. SPLEEN: Unremarkable. PANCREAS/PANCREATIC DUCT: No peripancreatic fluid or inflammatory changes in this patient with acute pancreatitis. Visualized pancreas is otherwise unremarkable. No pancreatic ductal dilatation or divisum. ADRENAL GLANDS: Unremarkable. KIDNEYS: Unremarkable. LYMPH NODES: No enlarged abdominal lymph nodes. VASULATURE: Unremarkable. PERITONEUM: No ascites. ABDOMINAL WALL: No hernia. No mass. BOWEL: Normal appendix (series 4/image 34). Grossly unremarkable. No bowel obstruction. BONES: No acute abnormality or worrisome osseous lesion. SOFT TISSUES: Unremarkable. MISCELLANEOUS: Unremarkable. IMPRESSION: 1. No acute findings of pancreatitis. 2. Negative MR abdomen. Electronically signed by: Zadie Herter MD 08/04/2023 07:32 PM EDT RP Workstation: [id number]   US  Abdomen Limited Result Date: 08/03/2023 CLINICAL DATA:  Pancreatitis EXAM: ULTRASOUND ABDOMEN LIMITED RIGHT UPPER QUADRANT COMPARISON:  Ultrasound 05/28/2023 and older. CT 08/02/2023 and older. FINDINGS: Gallbladder: Dilated gallbladder. No wall thickening, adjacent fluid or shadowing stones. There are sock for does report pain when scanning the gallbladder. Common bile duct: Diameter: 7 mm, dilated Liver: Echogenic hepatic parenchyma consistent with fatty liver infiltration. The portal vein is patent on color Doppler imaging with normal direction of blood flow towards the liver. Other: None. IMPRESSION: Fatty liver infiltration. Dilated gallbladder without stones. However there is a dilated common duct and the sonographer per reports pain when scanning the gallbladder. With these findings would recommend further evaluation such as HIDA scan or MRI. Electronically Signed   By: Adrianna Horde M.D.   On: 08/03/2023 11:16   CT ABDOMEN  PELVIS W CONTRAST Result Date: 08/02/2023 CLINICAL DATA:  Abdominal pain with nausea and vomiting. EXAM: CT ABDOMEN AND PELVIS WITH CONTRAST TECHNIQUE: Multidetector CT imaging of the abdomen and pelvis was performed using the standard protocol following bolus administration of intravenous contrast. RADIATION DOSE REDUCTION: This exam was performed according to the departmental dose-optimization program which includes automated exposure control, adjustment of the mA and/or kV according to patient size and/or use of iterative reconstruction technique. CONTRAST:  OMNIPAQUE  IOHEXOL  300 MG/ML  SOLN COMPARISON:  Jun 21, 2023 FINDINGS: Lower chest: No acute abnormality. Hepatobiliary: There is diffuse fatty infiltration of the liver parenchyma. No focal liver abnormality is seen. No gallstones, gallbladder wall thickening, or biliary dilatation. Pancreas: Unremarkable. No pancreatic ductal dilatation or surrounding inflammatory changes. Spleen: Normal in size without focal abnormality. Adrenals/Urinary Tract: Adrenal glands are unremarkable. Kidneys are normal in size, without renal calculi or hydronephrosis. A stable subcentimeter cyst is seen within the posterior aspect of the mid right kidney. The urinary bladder is markedly distended and is otherwise unremarkable. Stomach/Bowel: Stomach is within normal limits. Appendix appears normal. No evidence of bowel wall thickening, distention, or inflammatory changes. Vascular/Lymphatic: No significant vascular findings are present. No enlarged abdominal or pelvic lymph nodes. Reproductive: Prostate is unremarkable. Other: No abdominal wall hernia or abnormality. No abdominopelvic ascites. Musculoskeletal: No acute or significant osseous findings. IMPRESSION: 1. Hepatic steatosis. 2. No acute or active process within the abdomen or pelvis. Electronically Signed   By: Virgle Grime M.D.   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 GI was consulted due to concern of possible biliary obstruction.    Acute pancreatitis: -Lipase 700 on  admission -Epigastric pain, nausea, vomiting -Reports ongoing ETOH (regular daily drinking x6 months) -smoker, 1-2 PPD -CT A/P: hepatic steatosis -RUQ US  Fatty liver infiltration. Dilated gallbladder without stones, dilated common duct and the sonographer,reports pain when scanning the gallbladder -MRCP due to CBD dilation: negative -Last LFTs on 6/16 normal other than T bili 1.7 -WBC remains WNL at 5.5 -vomiting resolved, endorses increased epigastric pain with bites of solid food, tolerates liquids better -could have underlying gastritis, ulcer disease -transitioned to oral pain medication    Thrombocytopenia: -intermittent, decline during hospitalization -no overt cirrhosis on imaging -?related to Lovenox , last dose today, has been d/c'd      Plan:   Continue supportive measures. Strict ETOH cessation Continue PPI, increase to BID for possible etoh gastritis    LOS: 4 days   Trudie Fuse. Verlie Glisson [facility] [phone] 6/19/20251:38 PM   Addendum: Dr. Alita Irwin requesting LR 200cc/hr due to limited oral intake in setting of pancreatitis. Placed order. Reassess in am.   Trudie Fuse. Verlie Glisson [facility] [phone] 6/19/20253:46 PM

## 2023-08-07 ENCOUNTER — Telehealth: Payer: Self-pay | Admitting: Gastroenterology

## 2023-08-07 LAB — CBC
HCT: 39.3 % (ref 39.0–52.0)
Hemoglobin: 13.1 g/dL (ref 13.0–17.0)
MCH: 31 pg (ref 26.0–34.0)
MCHC: 33.3 g/dL (ref 30.0–36.0)
MCV: 93.1 fL (ref 80.0–100.0)
Platelets: 119 10*3/uL — ABNORMAL LOW (ref 150–400)
RBC: 4.22 MIL/uL (ref 4.22–5.81)
RDW: 11.8 % (ref 11.5–15.5)
WBC: 5.3 10*3/uL (ref 4.0–10.5)
nRBC: 0 % (ref 0.0–0.2)

## 2023-08-07 LAB — BASIC METABOLIC PANEL WITH GFR
Anion gap: 7 (ref 5–15)
BUN: 5 mg/dL — ABNORMAL LOW (ref 6–20)
CO2: 27 mmol/L (ref 22–32)
Calcium: 8.8 mg/dL — ABNORMAL LOW (ref 8.9–10.3)
Chloride: 103 mmol/L (ref 98–111)
Creatinine, Ser: 0.67 mg/dL (ref 0.61–1.24)
GFR, Estimated: >60 mL/min (ref >60)
Glucose, Bld: 76 mg/dL (ref 70–99)
Potassium: 3.5 mmol/L (ref 3.5–5.1)
Sodium: 137 mmol/L (ref 135–145)

## 2023-08-07 MED ORDER — PANTOPRAZOLE SODIUM 40 MG PO TBEC: 40.0000 mg | DELAYED_RELEASE_TABLET | Freq: Two times a day (BID) | ORAL | 1 refills | Status: DC

## 2023-08-07 MED ORDER — OXYCODONE HCL 5 MG PO TABS
5.0000 mg | ORAL_TABLET | ORAL | Status: DC | PRN
  Administered 2023-08-07: 5 mg via ORAL
  Filled 2023-08-07: qty 1

## 2023-08-07 MED ORDER — ONDANSETRON 4 MG PO TBDP: 4.0000 mg | ORAL_TABLET | Freq: Three times a day (TID) | ORAL | 0 refills | Status: DC | PRN

## 2023-08-07 MED ORDER — NICOTINE 21 MG/24HR TD PT24: 21.0000 mg | MEDICATED_PATCH | Freq: Every day | TRANSDERMAL | 0 refills | Status: DC

## 2023-08-07 MED ORDER — SUCRALFATE 1 G PO TABS: 1.0000 g | ORAL_TABLET | Freq: Three times a day (TID) | ORAL | 0 refills | Status: DC

## 2023-08-07 MED ORDER — OXYCODONE HCL 5 MG PO TABS: 5.0000 mg | ORAL_TABLET | Freq: Four times a day (QID) | ORAL | 0 refills | Status: DC | PRN

## 2023-08-07 MED ORDER — ACETAMINOPHEN 325 MG PO TABS: 650.0000 mg | ORAL_TABLET | Freq: Four times a day (QID) | ORAL | Status: DC | PRN

## 2023-08-07 NOTE — Progress Notes (Signed)
 GI signed off yesterday per Dr. Alita Irwin.   Summary of recommendations: Strict ETOH cessation recommended.  Recommend PPI BID for possible ETOH gastritis on d/c at least until follow up in clinic.  We will arrange follow up.  Diet as tolerated.  Avoid NSAIDs Once adequately taking p.o IV fluids can be d/c'd.  Please recall if any abrupt change in symptoms  Julian Obey, MSN, APRN, FNP-BC, AGACNP-BC [facility]

## 2023-08-07 NOTE — Plan of Care (Signed)
   Problem: Education: Goal: Knowledge of General Education information will improve Description Including pain rating scale, medication(s)/side effects and non-pharmacologic comfort measures Outcome: Progressing

## 2023-08-07 NOTE — Plan of Care (Signed)
  Problem: Education: Goal: Knowledge of General Education information will improve Description: Including pain rating scale, medication(s)/side effects and non-pharmacologic comfort measures Outcome: Progressing   Problem: Health Behavior/Discharge Planning: Goal: Ability to manage health-related needs will improve Outcome: Progressing   Problem: Clinical Measurements: Goal: Ability to maintain clinical measurements within normal limits will improve Outcome: Progressing   Problem: Activity: Goal: Risk for activity intolerance will decrease Outcome: Progressing   Problem: Nutrition: Goal: Adequate nutrition will be maintained Outcome: Progressing   Problem: Pain Managment: Goal: General experience of comfort will improve and/or be controlled Outcome: Progressing

## 2023-08-07 NOTE — Telephone Encounter (Signed)
 Please arrange hospital follow up in 3-4 weeks with Elida Grounds, or leslie who seen patient this admission. I have never seen this patient. If no availability can be with Dr. Riley Cheadle.

## 2023-08-07 NOTE — Discharge Summary (Addendum)
 Eryk Beavers, is a 28 y.o. male  DOB September 22, 1995  MRN [id number].  Admission date:  08/02/2023  Admitting Physician  Posey Maier, DO  Discharge Date:  08/07/2023   Primary MD  Pcp, No  Recommendations for primary care physician for things to follow:   1)Avoid ibuprofen /Advil /Aleve/Motrin Josefine Powders/Naproxen/BC powders/Meloxicam/Diclofenac/Indomethacin and other Nonsteroidal anti-inflammatory medications as these will make you more likely to bleed and can cause stomach ulcers, can also cause Kidney problems.   2)Complete Abstinence from alcohol advised--- alcohol rehab program including AA can be beneficial  3)Please Follow up with Gastroenterologist Mitzie Boettcher, NP -- address [address], [address], [place] [address],,[place] Number [phone]     4)Please Note that there has been changes to your medications  Admission Diagnosis  Pancreatitis [K85.90] Alcohol-induced acute pancreatitis, unspecified complication status [K85.20]   Discharge Diagnosis  Pancreatitis [K85.90] Alcohol-induced acute pancreatitis, unspecified complication status [K85.20]    Principal Problem:   Acute recurrent pancreatitis Active Problems:   Alcohol abuse   Tobacco abuse   Nausea & vomiting      Past Medical History:  Diagnosis Date   Anxiety    Heart murmur    Pancreatitis    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 No pertinent surgical history.    HPI  from the history and physical done on the day of admission:    HPI: Naim Murtha is a 28 y.o. male with medical history significant of f  heavy alcohol abuse, tobacco abuse and alcoholic pancreatitis who presents to the emergency department due to 1 day onset of epigastric abdominal pain in the epigastric area with radiation to the back.  Patient states that last alcohol intake was on Friday (beer and few shots), he started to  complain of abdominal pain on Saturday in the morning which worsened as the day progressed, so he presented to the ED yesterday during which lipase level was 153.  Patient endorsed vomiting PTA   Patient was admitted from 4/9 to 4/15 due to acute recurrent pancreatitis He was also admitted from 2/27 to 3/2 due to acute alcoholic pancreatitis     ED Course: In the emergency department, BP was 156/110, other vital signs were within normal range.  Workup in the ED showed normal CBC and BMP, urinalysis was normal, lipase was 700. CT abdomen and pelvis with contrast showed hepatic steatosis.  No acute or active process within the abdomen or pelvis. He was treated with IV hydration, IV Dilaudid  and morphine  was given.  Zofran  was also provided.  [facility] was asked to admit patient.   Review of Systems: Review of systems as noted in the HPI. All other systems reviewed and are negative.     Hospital Course:     Brief Narrative: 28 y.o. male with a PMH significant for heavy alcohol abuse, tobacco abuse and alcoholic pancreatitis who presents to the emergency department due to 1 day onset of epigastric abdominal pain in the epigastric area with radiation to the back.  Patient states  that last alcohol intake was on Friday (beer and few shots), he started to complain of abdominal pain on Saturday in the morning which worsened as the day progressed, so he presented to the ED yesterday during which lipase level was 153.  Patient endorsed vomiting PTA   On note, patient was admitted from 4/9 to 4/15 and 2/27 - 3/2 due to acute recurrent alcoholic pancreatitis.  Has been slowly improving while in house.     Assessment & Plan: Acute recurrent pancreatitis: -No significant abdominal pain ----Eating and drinking well=--tolerating soft/solid diet - No further nausea.  No vomiting.   - GI consulted due to concerns for biliary cause of pancreatitis, appreciate input.   - Negative MRCP, making EtOH more likely  cause of pancreatitis - Ambulating around   Mild acute thrombocytopenia: -HIT less likely -No bleeding concerns -Platelets stable over the last couple days after initially dropping 128 >>14>>119   Alcohol abuse No signs of acute withdrawal.   See pancreatitis above.     Tobacco abuse -Smoking cessation advised -May use over-the-counter nicotine  patch   Hypokalemia: - Normalized after replacement  Hyperbilirubinemia:  - RUQ showed dilated gallbladder and CBD   - Abdominal MRI/MRCP without acute findings  Discharge Condition: stable  Follow UP   Follow-up Information     Carlan, Chelsea L, NP. Schedule an appointment as soon as possible for a visit in 1 month(s).   Specialty: Gastroenterology Contact information: [address] [place] [address] [phone]                  Consults obtained -GI consult appreciated  Diet and Activity recommendation:  As advised  Discharge Instructions    Discharge Instructions     Call MD for:  difficulty breathing, headache or visual disturbances   Complete by: As directed    Call MD for:  persistant dizziness or light-headedness   Complete by: As directed    Call MD for:  persistant nausea and vomiting   Complete by: As directed    Call MD for:  severe uncontrolled pain   Complete by: As directed    Call MD for:  temperature >100.4   Complete by: As directed    Diet general   Complete by: As directed    Discharge instructions   Complete by: As directed    1)Avoid ibuprofen /Advil /Aleve/Motrin /Goody Powders/Naproxen/BC powders/Meloxicam/Diclofenac/Indomethacin and other Nonsteroidal anti-inflammatory medications as these will make you more likely to bleed and can cause stomach ulcers, can also cause Kidney problems.   2)Complete Abstinence from alcohol advised--- alcohol rehab program including AA can be beneficial  3)Please Follow up with Gastroenterologist Mitzie Boettcher, NP -- address [address], [address], [place] [address],,[place] Number [phone]     4)Please Note that there has been changes to your medications   Increase activity slowly   Complete by: As directed          Discharge Medications     Allergies as of 08/07/2023       Reactions   Other Other (See Comments)   Had a bad reaction to a shot in the stomach that was a blood thinner Sore R-thigh turned black and blue from shot   Amoxicillin Other (See Comments)   Makes tongue and throat hurt, dries it out   Bee Pollen Other (See Comments)   Seasonal allergies   Penicillins Swelling        Medication List     STOP taking  these medications    chlordiazePOXIDE  25 MG capsule Commonly known as: LIBRIUM    dicyclomine  20 MG tablet Commonly known as: BENTYL    omeprazole  20 MG capsule Commonly known as: PRILOSEC       TAKE these medications    acetaminophen  325 MG tablet Commonly known as: TYLENOL  Take 2 tablets (650 mg total) by mouth every 6 (six) hours as needed for mild pain (pain score 1-3) or fever (or Fever >/= 101).   nicotine  21 mg/24hr patch Commonly known as: NICODERM CQ  - dosed in mg/24 hours Place 1 patch (21 mg total) onto the skin daily. Start taking on: August 08, 2023   ondansetron  4 MG disintegrating tablet Commonly known as: ZOFRAN -ODT Take 1 tablet (4 mg total) by mouth every 8 (eight) hours as needed for nausea or vomiting.   oxyCODONE  5 MG immediate release tablet Commonly known as: Oxy IR/ROXICODONE  Take 1 tablet (5 mg total) by mouth every 6 (six) hours as needed for moderate pain (pain score 4-6).   pantoprazole  40 MG tablet Commonly known as: PROTONIX  Take 1 tablet (40 mg total) by mouth 2 (two) times daily before a meal.   sucralfate  1 g tablet Commonly known as: Carafate  Take 1 tablet (1 g total) by mouth 4 (four) times daily -  with meals and at bedtime. What changed:  when to take this reasons to take this        Major procedures and  Radiology Reports - PLEASE review detailed and final reports for all details, in brief -   MR 3D Recon At Scanner Result Date: 08/04/2023 EXAM: MRCP WITH AND WITHOUT IV CONTRAST 08/04/2023 06:42:43 PM TECHNIQUE: Multisequence, multiplanar magnetic resonance images of the abdomen with and without intravenous contrast. MRCP sequences were performed. COMPARISON: CT abdomen/pelvis dated 08/02/2023. CLINICAL HISTORY: Pancreatitis. FINDINGS: LIVER: Liver is within normal limits. No hepatic steatosis. GALLBLADDER AND BILIARY SYSTEM: Gallbladder is unremarkable. No intrahepatic or extrahepatic ductal dilation. No choledocholithiasis. SPLEEN: Unremarkable. PANCREAS/PANCREATIC DUCT: No peripancreatic fluid or inflammatory changes in this patient with acute pancreatitis. Visualized pancreas is otherwise unremarkable. No pancreatic ductal dilatation or divisum. ADRENAL GLANDS: Unremarkable. KIDNEYS: Unremarkable. LYMPH NODES: No enlarged abdominal lymph nodes. VASULATURE: Unremarkable. PERITONEUM: No ascites. ABDOMINAL WALL: No hernia. No mass. BOWEL: Normal appendix (series 4/image 34). Grossly unremarkable. No bowel obstruction. BONES: No acute abnormality or worrisome osseous lesion. SOFT TISSUES: Unremarkable. MISCELLANEOUS: Unremarkable. IMPRESSION: 1. No acute findings of pancreatitis. 2. Negative MR abdomen. Electronically signed by: Pinkie Pebbles MD 08/04/2023 07:32 PM EDT RP Workstation: [id number]   MR ABDOMEN MRCP W WO CONTAST Result Date: 08/04/2023 EXAM: MRCP WITH AND WITHOUT IV CONTRAST 08/04/2023 06:42:43 PM TECHNIQUE: Multisequence, multiplanar magnetic resonance images of the abdomen with and without intravenous contrast. MRCP sequences were performed. COMPARISON: CT abdomen/pelvis dated 08/02/2023. CLINICAL HISTORY: Pancreatitis. FINDINGS: LIVER: Liver is within normal limits. No hepatic steatosis. GALLBLADDER AND BILIARY SYSTEM: Gallbladder is unremarkable. No intrahepatic or extrahepatic ductal  dilation. No choledocholithiasis. SPLEEN: Unremarkable. PANCREAS/PANCREATIC DUCT: No peripancreatic fluid or inflammatory changes in this patient with acute pancreatitis. Visualized pancreas is otherwise unremarkable. No pancreatic ductal dilatation or divisum. ADRENAL GLANDS: Unremarkable. KIDNEYS: Unremarkable. LYMPH NODES: No enlarged abdominal lymph nodes. VASULATURE: Unremarkable. PERITONEUM: No ascites. ABDOMINAL WALL: No hernia. No mass. BOWEL: Normal appendix (series 4/image 34). Grossly unremarkable. No bowel obstruction. BONES: No acute abnormality or worrisome osseous lesion. SOFT TISSUES: Unremarkable. MISCELLANEOUS: Unremarkable. IMPRESSION: 1. No acute findings of pancreatitis. 2. Negative MR abdomen. Electronically signed by: Pinkie Pebbles MD 08/04/2023 07:32 PM  EDT RP Workstation: [id number]   US  Abdomen Limited Result Date: 08/03/2023 CLINICAL DATA:  Pancreatitis EXAM: ULTRASOUND ABDOMEN LIMITED RIGHT UPPER QUADRANT COMPARISON:  Ultrasound 05/28/2023 and older. CT 08/02/2023 and older. FINDINGS: Gallbladder: Dilated gallbladder. No wall thickening, adjacent fluid or shadowing stones. There are sock for does report pain when scanning the gallbladder. Common bile duct: Diameter: 7 mm, dilated Liver: Echogenic hepatic parenchyma consistent with fatty liver infiltration. The portal vein is patent on color Doppler imaging with normal direction of blood flow towards the liver. Other: None. IMPRESSION: Fatty liver infiltration. Dilated gallbladder without stones. However there is a dilated common duct and the sonographer per reports pain when scanning the gallbladder. With these findings would recommend further evaluation such as HIDA scan or MRI. Electronically Signed   By: Ranell Bring M.D.   On: 08/03/2023 11:16   CT ABDOMEN PELVIS W CONTRAST Result Date: 08/02/2023 CLINICAL DATA:  Abdominal pain with nausea and vomiting. EXAM: CT ABDOMEN AND PELVIS WITH CONTRAST TECHNIQUE: Multidetector CT  imaging of the abdomen and pelvis was performed using the standard protocol following bolus administration of intravenous contrast. RADIATION DOSE REDUCTION: This exam was performed according to the departmental dose-optimization program which includes automated exposure control, adjustment of the mA and/or kV according to patient size and/or use of iterative reconstruction technique. CONTRAST:  OMNIPAQUE  IOHEXOL  300 MG/ML  SOLN COMPARISON:  Jun 21, 2023 FINDINGS: Lower chest: No acute abnormality. Hepatobiliary: There is diffuse fatty infiltration of the liver parenchyma. No focal liver abnormality is seen. No gallstones, gallbladder wall thickening, or biliary dilatation. Pancreas: Unremarkable. No pancreatic ductal dilatation or surrounding inflammatory changes. Spleen: Normal in size without focal abnormality. Adrenals/Urinary Tract: Adrenal glands are unremarkable. Kidneys are normal in size, without renal calculi or hydronephrosis. A stable subcentimeter cyst is seen within the posterior aspect of the mid right kidney. The urinary bladder is markedly distended and is otherwise unremarkable. Stomach/Bowel: Stomach is within normal limits. Appendix appears normal. No evidence of bowel wall thickening, distention, or inflammatory changes. Vascular/Lymphatic: No significant vascular findings are present. No enlarged abdominal or pelvic lymph nodes. Reproductive: Prostate is unremarkable. Other: No abdominal wall hernia or abnormality. No abdominopelvic ascites. Musculoskeletal: No acute or significant osseous findings. IMPRESSION: 1. Hepatic steatosis. 2. No acute or active process within the abdomen or pelvis. Electronically Signed   By: Suzen Dials M.D.   On: 08/02/2023 00:38    Today   Subjective    Lynwood Mani today has no new complaints No fever  Or chills  - Eating and drinking well, had BM No Nausea, Vomiting or Diarrhea   Patient has been seen and examined prior to discharge    Objective   Blood pressure (!) 135/92, pulse 60, temperature 98.1 F (36.7 C), temperature source Oral, resp. rate 18, height 5' 10 (1.778 m), weight 64.2 kg, SpO2 99%.   Intake/Output Summary (Last 24 hours) at 08/07/2023 1518 Last data filed at 08/07/2023 9167 Gross per 24 hour  Intake 2466.91 ml  Output --  Net 2466.91 ml    Exam Gen:- Awake Alert, no acute distress  HEENT:- [place].AT, No noticeable sclera icterus Neck-Supple Neck,No JVD,.  Lungs-  CTAB , good air movement bilaterally CV- S1, S2 normal, regular Abd-  +ve B.Sounds, Abd Soft, No significant tenderness,    Extremity/Skin:- No  edema,   good pulses, no jaundice Psych-affect is appropriate, oriented x3 Neuro-no new focal deficits, no tremors    Data Review   CBC w Diff:  Lab  Results  Component Value Date   WBC 5.3 08/07/2023   HGB 13.1 08/07/2023   HCT 39.3 08/07/2023   PLT 119 (L) 08/07/2023   LYMPHOPCT 20 08/02/2023   MONOPCT 8 08/02/2023   EOSPCT 1 08/02/2023   BASOPCT 0 08/02/2023    CMP:  Lab Results  Component Value Date   NA 137 08/07/2023   K 3.5 08/07/2023   CL 103 08/07/2023   CO2 27 08/07/2023   BUN 5 (L) 08/07/2023   CREATININE 0.67 08/07/2023   PROT 6.8 08/03/2023   ALBUMIN 4.4 08/03/2023   BILITOT 1.7 (H) 08/03/2023   ALKPHOS 72 08/03/2023   AST 26 08/03/2023   ALT 21 08/03/2023  .  Total Discharge time is about 33 minutes  Rendall Carwin M.D on 08/07/2023 at 3:18 PM  Go to [email or web address] -  for contact info  [facility] - Office  [phone]

## 2023-08-11 ENCOUNTER — Encounter: Payer: Self-pay | Admitting: Gastroenterology

## 2023-08-11 ENCOUNTER — Encounter: Payer: Self-pay | Admitting: *Deleted

## 2023-08-26 ENCOUNTER — Emergency Department (HOSPITAL_COMMUNITY)
Admission: EM | Admit: 2023-08-26 | Discharge: 2023-08-27 | Disposition: A | Discharge status: Home / Self Care | Attending: Emergency Medicine | Admitting: Emergency Medicine

## 2023-08-26 DIAGNOSIS — R338 Other retention of urine: Secondary | ICD-10-CM

## 2023-08-26 DIAGNOSIS — R339 Retention of urine, unspecified: Secondary | ICD-10-CM | POA: Insufficient documentation

## 2023-08-26 DIAGNOSIS — R109 Unspecified abdominal pain: Secondary | ICD-10-CM | POA: Diagnosis present

## 2023-08-26 DIAGNOSIS — K2920 Alcoholic gastritis without bleeding: Secondary | ICD-10-CM | POA: Diagnosis not present

## 2023-08-26 DIAGNOSIS — F1010 Alcohol abuse, uncomplicated: Secondary | ICD-10-CM | POA: Diagnosis not present

## 2023-08-26 DIAGNOSIS — Y908 Blood alcohol level of 240 mg/100 ml or more: Secondary | ICD-10-CM | POA: Diagnosis not present

## 2023-08-26 NOTE — ED Triage Notes (Signed)
 Pt with c/o severe stomach pain and nausea that started last night. Pt states he has a hx of Pancreatitis. Pt comes in with large gatorade and drinking a bottle of water .

## 2023-08-27 ENCOUNTER — Other Ambulatory Visit: Payer: Self-pay

## 2023-08-27 ENCOUNTER — Encounter (HOSPITAL_COMMUNITY): Payer: Self-pay | Admitting: Emergency Medicine

## 2023-08-27 ENCOUNTER — Emergency Department (HOSPITAL_COMMUNITY)

## 2023-08-27 LAB — CBC WITH DIFFERENTIAL/PLATELET
Abs Immature Granulocytes: 0.03 K/uL (ref 0.00–0.07)
Basophils Absolute: 0.1 K/uL (ref 0.0–0.1)
Basophils Relative: 1 %
Eosinophils Absolute: 0.1 K/uL (ref 0.0–0.5)
Eosinophils Relative: 1 %
HCT: 48.3 % (ref 39.0–52.0)
Hemoglobin: 16.9 g/dL (ref 13.0–17.0)
Immature Granulocytes: 0 %
Lymphocytes Relative: 33 %
Lymphs Abs: 4.4 K/uL — ABNORMAL HIGH (ref 0.7–4.0)
MCH: 31 pg (ref 26.0–34.0)
MCHC: 35 g/dL (ref 30.0–36.0)
MCV: 88.6 fL (ref 80.0–100.0)
Monocytes Absolute: 0.8 K/uL (ref 0.1–1.0)
Monocytes Relative: 6 %
Neutro Abs: 7.9 K/uL — ABNORMAL HIGH (ref 1.7–7.7)
Neutrophils Relative %: 59 %
Platelets: 236 K/uL (ref 150–400)
RBC: 5.45 MIL/uL (ref 4.22–5.81)
RDW: 12 % (ref 11.5–15.5)
WBC: 13.3 K/uL — ABNORMAL HIGH (ref 4.0–10.5)
nRBC: 0 % (ref 0.0–0.2)

## 2023-08-27 LAB — COMPREHENSIVE METABOLIC PANEL WITH GFR
ALT: 38 U/L (ref 0–44)
AST: 54 U/L — ABNORMAL HIGH (ref 15–41)
Albumin: 5.1 g/dL — ABNORMAL HIGH (ref 3.5–5.0)
Alkaline Phosphatase: 82 U/L (ref 38–126)
Anion gap: 17 — ABNORMAL HIGH (ref 5–15)
BUN: 10 mg/dL (ref 6–20)
CO2: 22 mmol/L (ref 22–32)
Calcium: 9.5 mg/dL (ref 8.9–10.3)
Chloride: 102 mmol/L (ref 98–111)
Creatinine, Ser: 0.57 mg/dL — ABNORMAL LOW (ref 0.61–1.24)
GFR, Estimated: >60 mL/min (ref >60)
Glucose, Bld: 100 mg/dL — ABNORMAL HIGH (ref 70–99)
Potassium: 3.6 mmol/L (ref 3.5–5.1)
Sodium: 141 mmol/L (ref 135–145)
Total Bilirubin: 0.6 mg/dL (ref 0.0–1.2)
Total Protein: 7.8 g/dL (ref 6.5–8.1)

## 2023-08-27 LAB — URINALYSIS, ROUTINE W REFLEX MICROSCOPIC
Bilirubin Urine: NEGATIVE
Glucose, UA: NEGATIVE mg/dL
Hgb urine dipstick: NEGATIVE
Ketones, ur: NEGATIVE mg/dL
Leukocytes,Ua: NEGATIVE
Nitrite: NEGATIVE
Protein, ur: NEGATIVE mg/dL
Specific Gravity, Urine: 1.008 (ref 1.005–1.030)
pH: 7 (ref 5.0–8.0)

## 2023-08-27 LAB — LIPASE, BLOOD: Lipase: 206 U/L — ABNORMAL HIGH (ref 11–51)

## 2023-08-27 LAB — ETHANOL: Alcohol, Ethyl (B): 338 mg/dL (ref <15)

## 2023-08-27 MED ORDER — SODIUM CHLORIDE 0.9 % IV BOLUS
1000.0000 mL | Freq: Once | INTRAVENOUS | Status: AC
  Administered 2023-08-27: 1000 mL via INTRAVENOUS

## 2023-08-27 MED ORDER — SUCRALFATE 1 G PO TABS
1.0000 g | ORAL_TABLET | Freq: Three times a day (TID) | ORAL | Status: DC
  Administered 2023-08-27: 1 g via ORAL
  Filled 2023-08-27: qty 1

## 2023-08-27 MED ORDER — ONDANSETRON 4 MG PO TBDP: 4.0000 mg | ORAL_TABLET | Freq: Three times a day (TID) | ORAL | 0 refills | Status: DC | PRN

## 2023-08-27 MED ORDER — SUCRALFATE 1 G PO TABS
1.0000 g | ORAL_TABLET | Freq: Three times a day (TID) | ORAL | 0 refills | Status: DC
Start: 2023-08-27 — End: 2023-11-11

## 2023-08-27 MED ORDER — ONDANSETRON HCL 4 MG/2ML IJ SOLN
4.0000 mg | Freq: Once | INTRAMUSCULAR | Status: AC
  Administered 2023-08-27: 4 mg via INTRAVENOUS
  Filled 2023-08-27: qty 2

## 2023-08-27 MED ORDER — MORPHINE SULFATE (PF) 4 MG/ML IV SOLN
4.0000 mg | Freq: Once | INTRAVENOUS | Status: AC
  Administered 2023-08-27: 4 mg via INTRAVENOUS
  Filled 2023-08-27: qty 1

## 2023-08-27 MED ORDER — IOHEXOL 300 MG/ML  SOLN
100.0000 mL | Freq: Once | INTRAMUSCULAR | Status: AC | PRN
Start: 2023-08-27 — End: 2023-08-27
  Administered 2023-08-27: 100 mL via INTRAVENOUS

## 2023-08-27 NOTE — ED Notes (Signed)
 Patient transported to CT

## 2023-08-27 NOTE — Discharge Instructions (Signed)
 You were seen today for abdominal pain.  This is likely related to alcoholic gastritis.  You may have some mild pancreatitis but your CT imaging does not show any significant inflammation.  This is likely all related to your alcohol use.  Take Carafate  and Zofran  as needed.  You were also noted to be retaining urine.  This can be because of multiple reasons.  I advised that you should keep the Foley catheter in place but you declined.  Chronic distention can result in dysfunction of the bladder permanently.  It is important that you follow-up with urology.

## 2023-08-27 NOTE — ED Notes (Signed)
 Patient voided 1,070mL clear yellow urine. Bladder scan result of >854mL. MD made aware

## 2023-08-27 NOTE — ED Provider Notes (Signed)
 [place] [facility] AT [facility] Provider Note   CSN: [id number] Arrival date & time: 08/26/23  2355     Patient presents with: Abdominal Pain and Nausea   Bryan Edwards is a 28 y.o. male.   HPI     This is a 28 year old male with a history of alcohol induced pancreatitis who presents with abdominal pain.  Patient reports severe upper abdominal pain and nausea.  He believes that his flare from his pancreatitis.  Initially stated that he had not had any alcohol but then endorsed having an alcoholic beverage yesterday around noon.  No change in bowels.  No fevers.  Prior to Admission medications   Medication Sig Start Date End Date Taking? Authorizing Provider  ondansetron  (ZOFRAN -ODT) 4 MG disintegrating tablet Take 1 tablet (4 mg total) by mouth every 8 (eight) hours as needed for nausea or vomiting. 08/27/23  Yes Jomel Whittlesey, Charmaine FALCON, MD  sucralfate  (CARAFATE ) 1 g tablet Take 1 tablet (1 g total) by mouth 4 (four) times daily -  with meals and at bedtime. 08/27/23  Yes Marjon Doxtater, Charmaine FALCON, MD  acetaminophen  (TYLENOL ) 325 MG tablet Take 2 tablets (650 mg total) by mouth every 6 (six) hours as needed for mild pain (pain score 1-3) or fever (or Fever >/= 101). 08/07/23   Pearlean Manus, MD  nicotine  (NICODERM CQ  - DOSED IN MG/24 HOURS) 21 mg/24hr patch Place 1 patch (21 mg total) onto the skin daily. 08/08/23   Pearlean Manus, MD  ondansetron  (ZOFRAN -ODT) 4 MG disintegrating tablet Take 1 tablet (4 mg total) by mouth every 8 (eight) hours as needed for nausea or vomiting. 08/07/23   Pearlean Manus, MD  oxyCODONE  (OXY IR/ROXICODONE ) 5 MG immediate release tablet Take 1 tablet (5 mg total) by mouth every 6 (six) hours as needed for moderate pain (pain score 4-6). 08/07/23   Pearlean Manus, MD  pantoprazole  (PROTONIX ) 40 MG tablet Take 1 tablet (40 mg total) by mouth 2 (two) times daily before a meal. 08/07/23   Emokpae, Courage, MD  sucralfate  (CARAFATE ) 1 g tablet Take  1 tablet (1 g total) by mouth 4 (four) times daily -  with meals and at bedtime. 08/07/23   Pearlean Manus, MD    Allergies: Other, Amoxicillin, Bee pollen, and Penicillins    Review of Systems  Constitutional:  Negative for fever.  Respiratory:  Negative for shortness of breath.   Cardiovascular:  Negative for chest pain.  Gastrointestinal:  Positive for abdominal pain and nausea. Negative for diarrhea and vomiting.  All other systems reviewed and are negative.   Updated Vital Signs BP (!) 134/103 (BP Location: Left Arm)   Pulse 85   Temp 98.5 F (36.9 C) (Oral)   Resp 17   Ht 1.778 m (5' 10)   Wt 64 kg   SpO2 96%   BMI 20.24 kg/m   Physical Exam Vitals and nursing note reviewed.  Constitutional:      Appearance: He is well-developed. He is not ill-appearing.  HENT:     Head: Normocephalic and atraumatic.  Eyes:     Pupils: Pupils are equal, round, and reactive to light.  Cardiovascular:     Rate and Rhythm: Normal rate and regular rhythm.     Heart sounds: Normal heart sounds. No murmur heard. Pulmonary:     Effort: Pulmonary effort is normal. No respiratory distress.     Breath sounds: Normal breath sounds. No wheezing.  Abdominal:     General: Bowel sounds are  normal.     Palpations: Abdomen is soft.     Tenderness: There is abdominal tenderness in the epigastric area. There is no guarding or rebound.  Musculoskeletal:     Cervical back: Neck supple.  Lymphadenopathy:     Cervical: No cervical adenopathy.  Skin:    General: Skin is warm and dry.  Neurological:     Mental Status: He is alert and oriented to person, place, and time.  Psychiatric:        Mood and Affect: Mood normal.     (all labs ordered are listed, but only abnormal results are displayed) Labs Reviewed  CBC WITH DIFFERENTIAL/PLATELET - Abnormal; Notable for the following components:      Result Value   WBC 13.3 (*)    Neutro Abs 7.9 (*)    Lymphs Abs 4.4 (*)    All other components  within normal limits  COMPREHENSIVE METABOLIC PANEL WITH GFR - Abnormal; Notable for the following components:   Glucose, Bld 100 (*)    Creatinine, Ser 0.57 (*)    Albumin 5.1 (*)    AST 54 (*)    Anion gap 17 (*)    All other components within normal limits  LIPASE, BLOOD - Abnormal; Notable for the following components:   Lipase 206 (*)    All other components within normal limits  ETHANOL - Abnormal; Notable for the following components:   Alcohol, Ethyl (B) 338 (*)    All other components within normal limits  URINALYSIS, ROUTINE W REFLEX MICROSCOPIC - Abnormal; Notable for the following components:   Color, Urine COLORLESS (*)    All other components within normal limits    EKG: None  Radiology: CT ABDOMEN PELVIS W CONTRAST Result Date: 08/27/2023 CLINICAL DATA:  Abdominal pain, nausea EXAM: CT ABDOMEN AND PELVIS WITH CONTRAST TECHNIQUE: Multidetector CT imaging of the abdomen and pelvis was performed using the standard protocol following bolus administration of intravenous contrast. RADIATION DOSE REDUCTION: This exam was performed according to the departmental dose-optimization program which includes automated exposure control, adjustment of the mA and/or kV according to patient size and/or use of iterative reconstruction technique. CONTRAST:  OMNIPAQUE  IOHEXOL  300 MG/ML  SOLN COMPARISON:  08/02/2023 FINDINGS: Lower chest: No acute abnormality Hepatobiliary: No focal hepatic abnormality. Gallbladder unremarkable. Pancreas: Atrophy of the pancreatic tail. No focal pancreatic abnormality or ductal dilatation. No visible surrounding inflammation. Spleen: No focal abnormality.  Normal size. Adrenals/Urinary Tract: Mild bilateral hydronephrosis. Urinary bladder is markedly distended above the level of the umbilicus. No suspicious focal renal or adrenal abnormality. Stomach/Bowel: Stomach, large and small bowel grossly unremarkable. Vascular/Lymphatic: No evidence of aneurysm or  adenopathy. Reproductive: No visible focal abnormality. Other: No free fluid or free air. Musculoskeletal: No acute bony abnormality. IMPRESSION: Markedly distended urinary bladder with mild bilateral hydronephrosis. Electronically Signed   By: Franky Crease M.D.   On: 08/27/2023 02:29     Procedures   Medications Ordered in the ED  sucralfate  (CARAFATE ) tablet 1 g (1 g Oral Given 08/27/23 0354)  sodium chloride  0.9 % bolus 1,000 mL (0 mLs Intravenous Stopped 08/27/23 0130)  morphine  (PF) 4 MG/ML injection 4 mg (4 mg Intravenous Given 08/27/23 0015)  ondansetron  (ZOFRAN ) injection 4 mg (4 mg Intravenous Given 08/27/23 0014)  iohexol  (OMNIPAQUE ) 300 MG/ML solution 100 mL (100 mLs Intravenous Contrast Given 08/27/23 0216)    Clinical Course as of 08/27/23 0434  Thu Aug 27, 2023  0335 Per nursing, patient urinated greater than 1200 after  CT scan.  Bladder scan indicated an additional at least 800.  Foley catheter was placed with greater than 1300 [CH]  0415 Discussed with patient that I felt his symptoms were likely related to gastritis over pancreatitis.  Regardless both are alcohol related.  Alcohol level greater than 300 suggestive of ongoing chronic alcohol use.  Additionally, noted to have acute urinary retention.  Patient does not have a significant urge to urinate but had greater than 2500 cc out between 1200 spontaneous void and 1300 in the catheter bag.  Recommended that he keep Foley catheter in place and follow-up with urology.  Question whether there may be a neurogenic component given that he does not have an urge of fullness or need for urination.  Patient refused to keep catheter in.  Discussed with him that chronic distention can cause permanent dysfunction.  Will have him follow-up with urology. [CH]    Clinical Course User Index [CH] Sheniece Ruggles, Charmaine FALCON, MD                                 Medical Decision Making Amount and/or Complexity of Data Reviewed Labs: ordered. Radiology:  ordered.  Risk Prescription drug management.   This patient presents to the ED for concern of abdominal pain, this involves an extensive number of treatment options, and is a complaint that carries with it a high risk of complications and morbidity.  I considered the following differential and admission for this acute, potentially life threatening condition.  The differential diagnosis includes gastritis, gastroenteritis, pancreatitis, cholecystitis  MDM:    This is a 28 year old male who presents with recurrent epigastric pain.  Feels it is consistent with his pancreatitis.  Nontoxic-appearing.  Vital signs reassuring.  Patient does have epigastric tenderness to palpation.  Patient was given fluids and nausea medication.  Labs obtained.  Leukocytosis to 13.3.  Lipase elevated to 206.  Blood alcohol level 338.  Patient continues to deny significant alcohol use.  CT abdomen does not show any significant pancreatic inflammation or pancreatic pseudocyst.  Of note patient had a markedly distended bladder with bilateral hydronephrosis.  Favor gastritis over pancreatitis.  He was able to spontaneously void approximately 1000 cc but subsequently had an additional >1300 cc with catheter placement.  He refused to go home with catheter.  I discussed with him the long-term complications of bladder distention.  He stated understanding but continued to decline.  Will give urology for follow-up.  (Labs, imaging, consults)  Labs: I Ordered, and personally interpreted labs.  The pertinent results include: CBC, CMP, lipase, urinalysis  Imaging Studies ordered: I ordered imaging studies including CT I independently visualized and interpreted imaging. I agree with the radiologist interpretation  Additional history obtained from chart review.  External records from outside source obtained and reviewed including prior evaluations  Cardiac Monitoring: The patient was maintained on a cardiac monitor.  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    Dispense:  90 tablet    Refill:  0   ondansetron  (ZOFRAN -ODT) 4 MG disintegrating tablet    Sig: Take 1 tablet (4 mg total) by mouth every 8 (eight) hours as needed for nausea or vomiting.    Dispense:  20 tablet    Refill:  0    I have reviewed the patients home medicines and have made adjustments as needed  Problem List / ED Course: Problem List Items Addressed This Visit       Other   Alcohol abuse   Other Visit Diagnoses       Acute alcoholic gastritis without hemorrhage    -  Primary     Acute urinary retention                    Final diagnoses:  Acute alcoholic gastritis without hemorrhage  Acute urinary retention  Alcohol abuse    ED Discharge Orders          Ordered    sucralfate  (CARAFATE ) 1 g tablet  3 times daily with meals & bedtime        08/27/23 0430    ondansetron  (ZOFRAN -ODT) 4 MG disintegrating tablet  Every 8 hours PRN        08/27/23 0430               Bari Charmaine FALCON, MD 08/27/23 [phone]

## 2023-11-08 ENCOUNTER — Other Ambulatory Visit: Payer: Self-pay

## 2023-11-08 ENCOUNTER — Inpatient Hospital Stay (HOSPITAL_COMMUNITY)
Admission: EM | Admit: 2023-11-08 | Discharge: 2023-11-11 | DRG: 439 | Disposition: A | Discharge status: Home / Self Care | Attending: Internal Medicine | Admitting: Internal Medicine

## 2023-11-08 ENCOUNTER — Encounter (HOSPITAL_COMMUNITY): Payer: Self-pay

## 2023-11-08 DIAGNOSIS — Z716 Tobacco abuse counseling: Secondary | ICD-10-CM | POA: Diagnosis not present

## 2023-11-08 DIAGNOSIS — F17210 Nicotine dependence, cigarettes, uncomplicated: Secondary | ICD-10-CM | POA: Diagnosis present

## 2023-11-08 DIAGNOSIS — F1010 Alcohol abuse, uncomplicated: Secondary | ICD-10-CM | POA: Diagnosis present

## 2023-11-08 DIAGNOSIS — R17 Unspecified jaundice: Secondary | ICD-10-CM | POA: Diagnosis present

## 2023-11-08 DIAGNOSIS — Z86718 Personal history of other venous thrombosis and embolism: Secondary | ICD-10-CM | POA: Diagnosis not present

## 2023-11-08 DIAGNOSIS — E860 Dehydration: Secondary | ICD-10-CM | POA: Diagnosis present

## 2023-11-08 DIAGNOSIS — K219 Gastro-esophageal reflux disease without esophagitis: Secondary | ICD-10-CM | POA: Diagnosis present

## 2023-11-08 DIAGNOSIS — Z720 Tobacco use: Secondary | ICD-10-CM | POA: Diagnosis present

## 2023-11-08 DIAGNOSIS — K852 Alcohol induced acute pancreatitis without necrosis or infection: Secondary | ICD-10-CM | POA: Diagnosis present

## 2023-11-08 DIAGNOSIS — E871 Hypo-osmolality and hyponatremia: Secondary | ICD-10-CM | POA: Diagnosis present

## 2023-11-08 LAB — COMPREHENSIVE METABOLIC PANEL WITH GFR
ALT: 41 U/L (ref 0–44)
AST: 49 U/L — ABNORMAL HIGH (ref 15–41)
Albumin: 5.2 g/dL — ABNORMAL HIGH (ref 3.5–5.0)
Alkaline Phosphatase: 97 U/L (ref 38–126)
Anion gap: 20 — ABNORMAL HIGH (ref 5–15)
BUN: 9 mg/dL (ref 6–20)
CO2: 22 mmol/L (ref 22–32)
Calcium: 9.8 mg/dL (ref 8.9–10.3)
Chloride: 93 mmol/L — ABNORMAL LOW (ref 98–111)
Creatinine, Ser: 0.68 mg/dL (ref 0.61–1.24)
GFR, Estimated: >60 mL/min (ref >60)
Glucose, Bld: 89 mg/dL (ref 70–99)
Potassium: 3.8 mmol/L (ref 3.5–5.1)
Sodium: 135 mmol/L (ref 135–145)
Total Bilirubin: 1.8 mg/dL — ABNORMAL HIGH (ref 0.0–1.2)
Total Protein: 8.1 g/dL (ref 6.5–8.1)

## 2023-11-08 LAB — CBC
HCT: 48.2 % (ref 39.0–52.0)
Hemoglobin: 16.9 g/dL (ref 13.0–17.0)
MCH: 31.8 pg (ref 26.0–34.0)
MCHC: 35.1 g/dL (ref 30.0–36.0)
MCV: 90.6 fL (ref 80.0–100.0)
Platelets: 160 K/uL (ref 150–400)
RBC: 5.32 MIL/uL (ref 4.22–5.81)
RDW: 13.2 % (ref 11.5–15.5)
WBC: 13.3 K/uL — ABNORMAL HIGH (ref 4.0–10.5)
nRBC: 0 % (ref 0.0–0.2)

## 2023-11-08 LAB — ETHANOL: Alcohol, Ethyl (B): <15 mg/dL (ref <15)

## 2023-11-08 LAB — LIPASE, BLOOD: Lipase: 627 U/L — ABNORMAL HIGH (ref 11–51)

## 2023-11-08 MED ORDER — HYDROMORPHONE HCL 1 MG/ML IJ SOLN
1.0000 mg | Freq: Once | INTRAMUSCULAR | Status: AC
  Administered 2023-11-08: 1 mg via INTRAVENOUS
  Filled 2023-11-08: qty 1

## 2023-11-08 MED ORDER — SODIUM CHLORIDE 0.9% FLUSH: 3.0000 mL | INTRAVENOUS | Status: DC | PRN

## 2023-11-08 MED ORDER — LORAZEPAM 2 MG/ML IJ SOLN
1.0000 mg | INTRAMUSCULAR | Status: DC | PRN
  Administered 2023-11-08 (×2): 1 mg via INTRAVENOUS
  Administered 2023-11-08 – 2023-11-09 (×2): 2 mg via INTRAVENOUS
  Administered 2023-11-09: 3 mg via INTRAVENOUS
  Administered 2023-11-09 – 2023-11-10 (×5): 1 mg via INTRAVENOUS
  Filled 2023-11-08 (×4): qty 1
  Filled 2023-11-08: qty 2
  Filled 2023-11-08 (×5): qty 1

## 2023-11-08 MED ORDER — LACTATED RINGERS IV SOLN
INTRAVENOUS | Status: DC
Start: 2023-11-08 — End: 2023-11-08

## 2023-11-08 MED ORDER — LACTATED RINGERS IV BOLUS
1000.0000 mL | Freq: Once | INTRAVENOUS | Status: AC
Start: 2023-11-08 — End: 2023-11-08
  Administered 2023-11-08: 1000 mL via INTRAVENOUS

## 2023-11-08 MED ORDER — THIAMINE HCL 100 MG/ML IJ SOLN
100.0000 mg | Freq: Every day | INTRAMUSCULAR | Status: DC
  Administered 2023-11-08 – 2023-11-11 (×2): 100 mg via INTRAVENOUS
  Filled 2023-11-08 (×2): qty 2

## 2023-11-08 MED ORDER — POLYETHYLENE GLYCOL 3350 17 G PO PACK: 17.0000 g | PACK | Freq: Every day | ORAL | Status: DC | PRN

## 2023-11-08 MED ORDER — THIAMINE MONONITRATE 100 MG PO TABS
100.0000 mg | ORAL_TABLET | Freq: Every day | ORAL | Status: DC
  Administered 2023-11-09 – 2023-11-10 (×2): 100 mg via ORAL
  Filled 2023-11-08 (×3): qty 1

## 2023-11-08 MED ORDER — SODIUM CHLORIDE 0.9% FLUSH
3.0000 mL | Freq: Two times a day (BID) | INTRAVENOUS | Status: DC
  Administered 2023-11-08 – 2023-11-11 (×5): 3 mL via INTRAVENOUS

## 2023-11-08 MED ORDER — DIPHENHYDRAMINE HCL 50 MG/ML IJ SOLN
25.0000 mg | Freq: Once | INTRAMUSCULAR | Status: AC
  Administered 2023-11-08: 25 mg via INTRAVENOUS
  Filled 2023-11-08: qty 1

## 2023-11-08 MED ORDER — LACTATED RINGERS IV SOLN: INTRAVENOUS | Status: AC

## 2023-11-08 MED ORDER — NICOTINE 21 MG/24HR TD PT24
21.0000 mg | MEDICATED_PATCH | Freq: Every day | TRANSDERMAL | Status: DC
  Administered 2023-11-08 – 2023-11-11 (×4): 21 mg via TRANSDERMAL
  Filled 2023-11-08 (×4): qty 1

## 2023-11-08 MED ORDER — ADULT MULTIVITAMIN W/MINERALS CH
1.0000 | ORAL_TABLET | Freq: Every day | ORAL | Status: DC
  Administered 2023-11-09 – 2023-11-11 (×3): 1 via ORAL
  Filled 2023-11-08 (×3): qty 1

## 2023-11-08 MED ORDER — FOLIC ACID 1 MG PO TABS
1.0000 mg | ORAL_TABLET | Freq: Every day | ORAL | Status: DC
  Administered 2023-11-09 – 2023-11-11 (×3): 1 mg via ORAL
  Filled 2023-11-08 (×3): qty 1

## 2023-11-08 MED ORDER — ENOXAPARIN SODIUM 40 MG/0.4ML IJ SOSY
40.0000 mg | PREFILLED_SYRINGE | INTRAMUSCULAR | Status: DC
  Administered 2023-11-08 – 2023-11-10 (×3): 40 mg via SUBCUTANEOUS
  Filled 2023-11-08 (×3): qty 0.4

## 2023-11-08 MED ORDER — METOCLOPRAMIDE HCL 5 MG/ML IJ SOLN
10.0000 mg | Freq: Once | INTRAMUSCULAR | Status: AC
  Administered 2023-11-08: 10 mg via INTRAVENOUS
  Filled 2023-11-08: qty 2

## 2023-11-08 MED ORDER — SUCRALFATE 1 G PO TABS
1.0000 g | ORAL_TABLET | Freq: Three times a day (TID) | ORAL | Status: DC
  Administered 2023-11-08 – 2023-11-11 (×11): 1 g via ORAL
  Filled 2023-11-08 (×11): qty 1

## 2023-11-08 MED ORDER — HYDROMORPHONE HCL 1 MG/ML IJ SOLN: 1.0000 mg | INTRAMUSCULAR | Status: DC | PRN

## 2023-11-08 MED ORDER — LABETALOL HCL 5 MG/ML IV SOLN
10.0000 mg | INTRAVENOUS | Status: DC | PRN
  Administered 2023-11-08: 10 mg via INTRAVENOUS
  Filled 2023-11-08: qty 4

## 2023-11-08 MED ORDER — ACETAMINOPHEN 650 MG RE SUPP: 650.0000 mg | Freq: Four times a day (QID) | RECTAL | Status: DC | PRN

## 2023-11-08 MED ORDER — PANTOPRAZOLE SODIUM 40 MG IV SOLR
40.0000 mg | Freq: Once | INTRAVENOUS | Status: AC
  Administered 2023-11-08: 40 mg via INTRAVENOUS
  Filled 2023-11-08: qty 10

## 2023-11-08 MED ORDER — ONDANSETRON HCL 4 MG/2ML IJ SOLN
4.0000 mg | Freq: Four times a day (QID) | INTRAMUSCULAR | Status: DC | PRN
  Administered 2023-11-08 – 2023-11-10 (×4): 4 mg via INTRAVENOUS
  Filled 2023-11-08 (×4): qty 2

## 2023-11-08 MED ORDER — SODIUM CHLORIDE 0.9 % IV SOLN: INTRAVENOUS | Status: AC | PRN

## 2023-11-08 MED ORDER — PANTOPRAZOLE SODIUM 40 MG PO TBEC
40.0000 mg | DELAYED_RELEASE_TABLET | Freq: Two times a day (BID) | ORAL | Status: DC
  Administered 2023-11-09 – 2023-11-11 (×5): 40 mg via ORAL
  Filled 2023-11-08 (×5): qty 1

## 2023-11-08 MED ORDER — LORAZEPAM 1 MG PO TABS
1.0000 mg | ORAL_TABLET | ORAL | Status: DC | PRN
  Administered 2023-11-10: 1 mg via ORAL
  Filled 2023-11-08: qty 1

## 2023-11-08 MED ORDER — HYDROMORPHONE HCL 1 MG/ML IJ SOLN
1.0000 mg | INTRAMUSCULAR | Status: DC | PRN
  Administered 2023-11-08 – 2023-11-10 (×13): 1 mg via INTRAVENOUS
  Filled 2023-11-08 (×13): qty 1

## 2023-11-08 MED ORDER — SODIUM CHLORIDE 0.9% FLUSH
3.0000 mL | Freq: Two times a day (BID) | INTRAVENOUS | Status: DC
  Administered 2023-11-08 – 2023-11-11 (×4): 3 mL via INTRAVENOUS

## 2023-11-08 MED ORDER — ACETAMINOPHEN 325 MG PO TABS: 650.0000 mg | ORAL_TABLET | Freq: Four times a day (QID) | ORAL | Status: DC | PRN

## 2023-11-08 MED ORDER — DIAZEPAM 2 MG PO TABS
2.0000 mg | ORAL_TABLET | Freq: Three times a day (TID) | ORAL | Status: AC
  Administered 2023-11-08 – 2023-11-10 (×5): 2 mg via ORAL
  Filled 2023-11-08 (×5): qty 1

## 2023-11-08 MED ORDER — HYDROXYZINE HCL 25 MG PO TABS
25.0000 mg | ORAL_TABLET | Freq: Three times a day (TID) | ORAL | Status: AC
  Administered 2023-11-08 – 2023-11-10 (×5): 25 mg via ORAL
  Filled 2023-11-08 (×5): qty 1

## 2023-11-08 MED ORDER — SODIUM CHLORIDE 0.9 % IV SOLN: INTRAVENOUS | Status: AC

## 2023-11-08 MED ORDER — ONDANSETRON HCL 4 MG PO TABS
4.0000 mg | ORAL_TABLET | Freq: Four times a day (QID) | ORAL | Status: DC | PRN
  Administered 2023-11-10: 4 mg via ORAL
  Filled 2023-11-08: qty 1

## 2023-11-08 MED ORDER — BISACODYL 10 MG RE SUPP: 10.0000 mg | Freq: Every day | RECTAL | Status: DC | PRN

## 2023-11-08 MED ORDER — SODIUM CHLORIDE 0.9 % IV BOLUS
1000.0000 mL | Freq: Once | INTRAVENOUS | Status: AC
  Administered 2023-11-08: 1000 mL via INTRAVENOUS

## 2023-11-08 NOTE — ED Triage Notes (Signed)
 Pt stated that he thinks his panceatitis is flaring up. Complaining of severe pain, n/v

## 2023-11-08 NOTE — H&P (Signed)
 History and Physical    Patient: Bryan Edwards FMW:[id number] DOB: September 20, 1995 DOA: 11/08/2023 DOS: the patient was seen and examined on 11/08/2023 PCP: Pcp, No  Patient coming from: Home  Chief Complaint:  Chief Complaint  Patient presents with   Abdominal Pain   HPI: Bryan Edwards is a 28 y.o. male with medical history significant for  Ongoing alcohol and tobacco abuse and history of recurrent acute on chronic pancreatitis with frequent admissions for same presents with abdominal pain nausea and vomiting consistent with his usual presentation for pancreatitis flareup -Emesis is without bile or blood No fever  Or chills  - No URI symptoms -Lipase 627, it was 206 on 08/27/2023 - Creatinine 0.68,  sodium 135, potassium 3.8, chloride 93, glucose 89, AST 49 ALT 41 T. bili 1.8 anion gap is 20 with a bicarb of 22 - WBC 13.3 similar to prior, hemoglobin 16.9 platelets 160  Review of Systems: As mentioned in the history of present illness. All other systems reviewed and are negative. 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 No pertinent surgical history. Social History:  reports that he has been smoking cigarettes. He does not have any smokeless tobacco history on file. He reports current alcohol use. He reports that he does not use drugs.  Allergies  Allergen Reactions   Other Other (See Comments)    Had a bad reaction to a shot in the stomach that was a blood thinner Sore R-thigh turned black and blue from shot   Amoxicillin Other (See Comments)    Makes tongue and throat hurt, dries it out   Bee Pollen Other (See Comments)    Seasonal allergies   Penicillins Swelling    History reviewed. No pertinent family history.  Prior to Admission medications   Medication Sig Start Date End Date Taking? Authorizing Provider  nicotine  (NICODERM CQ  - DOSED IN MG/24 HOURS) 21 mg/24hr patch  Place 1 patch (21 mg total) onto the skin daily. Patient not taking: Reported on 11/08/2023 08/08/23   Pearlean Manus, MD  pantoprazole  (PROTONIX ) 40 MG tablet Take 1 tablet (40 mg total) by mouth 2 (two) times daily before a meal. Patient not taking: Reported on 11/08/2023 08/07/23   Pearlean Manus, MD  sucralfate  (CARAFATE ) 1 g tablet Take 1 tablet (1 g total) by mouth 4 (four) times daily -  with meals and at bedtime. Patient not taking: Reported on 11/08/2023 08/27/23   Bari Charmaine FALCON, MD    Physical Exam: Vitals:   11/08/23 1638 11/08/23 1710 11/08/23 1749 11/08/23 1808  BP: (!) 166/109 (!) 169/115 (!) 172/116 (!) 168/112  Pulse: 77 66  60  Resp:  20    Temp:  97.6 F (36.4 C)    TempSrc:  Oral    SpO2:  99%    Weight:      Height:        Physical Exam  Gen:- Awake Alert, in no acute distress  HEENT:- [place].AT, No sclera icterus Neck-Supple Neck,No JVD,.  Lungs-  CTAB , fair air movement bilaterally  CV- S1, S2 normal, RRR Abd-  +ve B.Sounds, Abd Soft, abdominal tenderness mostly in epigastric and periumbilical area as well as left upper quadrant Extremity/Skin:- No  edema,   good pedal pulses  Psych-affect is appropriate, oriented x3 Neuro-no new focal deficits, no tremors  Data Reviewed: Lipase 627, it was 206 on 08/27/2023 -  Creatinine 0.68,  sodium 135, potassium 3.8, chloride 93, glucose 89, AST 49 ALT 41 T. bili 1.8 anion gap is 20 with a bicarb of 22 - WBC 13.3 similar to prior, hemoglobin 16.9 platelets 160  Assessment & Plan: 1)Acute recurrent alcoholic pancreatitis: -history of recurrent acute on chronic pancreatitis with frequent admissions for same  Lipase 627, it was 206 on 08/27/2023 - Patient has had frequent abdominal imaging -Consider repeat abdominal imaging only if symptoms fail to improve as anticipated -Aggressive IV fluid -N.p.o. status -Antiemetics and opiates for pain control   2)Alcohol abuse--high risk for DTs as patient is trying to reduce  amount of alcoholic intake lately Benzos per CIWA protocol Folic-acid, thiamine  multivitamin as ordered  3)Tobacco abuse -Smoking cessation advised - Nicotine  patches ordered   4)Hyperbilirubinemia:  - Not new patient has recurrent episodes of same - Recent abdominal MRI/MRCP without acute findings   Advance Care Planning:   Code Status: Full Code   Family Communication: none at bedside   Severity of Illness: The appropriate patient status for this patient is INPATIENT. Inpatient status is judged to be reasonable and necessary in order to provide the required intensity of service to ensure the patient's safety. The patient's presenting symptoms, physical exam findings, and initial radiographic and laboratory data in the context of their chronic comorbidities is felt to place them at high risk for further clinical deterioration. Furthermore, it is not anticipated that the patient will be medically stable for discharge from the hospital within 2 midnights of admission.   * I certify that at the point of admission it is my clinical judgment that the patient will require inpatient hospital care spanning beyond 2 midnights from the point of admission due to high intensity of service, high risk for further deterioration and high frequency of surveillance required.*  Author: Rendall Carwin, MD 11/08/2023 6:09 PM  For on call review [email or web address].

## 2023-11-08 NOTE — ED Provider Notes (Signed)
 [place] EMERGENCY DEPARTMENT AT [facility] Provider Note   CSN: [id number] Arrival date & time: 11/08/23  1323     Patient presents with: Abdominal Pain   Bryan Edwards is a 28 y.o. male.   Patient is a 28 year old male with past medical history of pancreatitis and alcohol use who presents to the emergency department with a chief complaint of epigastric abdominal pain.  Patient notes his last alcohol intake was 2 days ago.  He notes that yesterday he began developing epigastric pain, nausea, vomiting.  He has had no diarrhea or constipation.  He denies any dysuria or hematuria.  He has had no chills, chest pain, shortness of breath.  He has had no melena, hematochezia, hematemesis.   Abdominal Pain Associated symptoms: nausea and vomiting        Prior to Admission medications   Medication Sig Start Date End Date Taking? Authorizing Provider  acetaminophen  (TYLENOL ) 325 MG tablet Take 2 tablets (650 mg total) by mouth every 6 (six) hours as needed for mild pain (pain score 1-3) or fever (or Fever >/= 101). 08/07/23   Pearlean Manus, MD  nicotine  (NICODERM CQ  - DOSED IN MG/24 HOURS) 21 mg/24hr patch Place 1 patch (21 mg total) onto the skin daily. 08/08/23   Pearlean Manus, MD  ondansetron  (ZOFRAN -ODT) 4 MG disintegrating tablet Take 1 tablet (4 mg total) by mouth every 8 (eight) hours as needed for nausea or vomiting. 08/07/23   Pearlean Manus, MD  ondansetron  (ZOFRAN -ODT) 4 MG disintegrating tablet Take 1 tablet (4 mg total) by mouth every 8 (eight) hours as needed for nausea or vomiting. 08/27/23   Horton, Charmaine FALCON, MD  oxyCODONE  (OXY IR/ROXICODONE ) 5 MG immediate release tablet Take 1 tablet (5 mg total) by mouth every 6 (six) hours as needed for moderate pain (pain score 4-6). 08/07/23   Pearlean Manus, MD  pantoprazole  (PROTONIX ) 40 MG tablet Take 1 tablet (40 mg total) by mouth 2 (two) times daily before a meal. 08/07/23   Emokpae, Courage, MD  sucralfate   (CARAFATE ) 1 g tablet Take 1 tablet (1 g total) by mouth 4 (four) times daily -  with meals and at bedtime. 08/07/23   Pearlean Manus, MD  sucralfate  (CARAFATE ) 1 g tablet Take 1 tablet (1 g total) by mouth 4 (four) times daily -  with meals and at bedtime. 08/27/23   Horton, Charmaine FALCON, MD    Allergies: Other, Amoxicillin, Bee pollen, and Penicillins    Review of Systems  Gastrointestinal:  Positive for abdominal pain, nausea and vomiting.    Updated Vital Signs BP (!) 162/121 (BP Location: Right Arm)   Pulse 97   Temp 97.6 F (36.4 C) (Oral)   Resp 18   Ht 5' 10 (1.778 m)   Wt 60.8 kg   SpO2 100%   BMI 19.23 kg/m   Physical Exam Vitals and nursing note reviewed.  Constitutional:      General: He is not in acute distress.    Appearance: Normal appearance. He is not ill-appearing.  HENT:     Head: Normocephalic and atraumatic.     Nose: Nose normal.     Mouth/Throat:     Mouth: Mucous membranes are moist.  Eyes:     Extraocular Movements: Extraocular movements intact.     Conjunctiva/sclera: Conjunctivae normal.     Pupils: Pupils are equal, round, and reactive to light.  Cardiovascular:     Rate and Rhythm: Normal rate and regular rhythm.  Pulses: Normal pulses.     Heart sounds: Normal heart sounds. No murmur heard.    No gallop.  Pulmonary:     Effort: Pulmonary effort is normal. No respiratory distress.     Breath sounds: Normal breath sounds. No stridor. No wheezing, rhonchi or rales.  Abdominal:     General: Abdomen is flat. Bowel sounds are normal. There is no distension.     Palpations: Abdomen is soft.     Tenderness: There is abdominal tenderness in the epigastric area. There is guarding. Negative signs include Murphy's sign and McBurney's sign.     Hernia: No hernia is present.  Musculoskeletal:        General: Normal range of motion.     Cervical back: Normal range of motion and neck supple.  Skin:    General: Skin is warm and dry.  Neurological:      General: No focal deficit present.     Mental Status: He is alert and oriented to person, place, and time. Mental status is at baseline.  Psychiatric:        Mood and Affect: Mood normal.        Behavior: Behavior normal.        Thought Content: Thought content normal.        Judgment: Judgment normal.     (all labs ordered are listed, but only abnormal results are displayed) Labs Reviewed  CBC - Abnormal; Notable for the following components:      Result Value   WBC 13.3 (*)    All other components within normal limits  LIPASE, BLOOD  COMPREHENSIVE METABOLIC PANEL WITH GFR    EKG: None  Radiology: No results found.   Procedures   Medications Ordered in the ED  metoCLOPramide  (REGLAN ) injection 10 mg (has no administration in time range)  diphenhydrAMINE  (BENADRYL ) injection 25 mg (has no administration in time range)  pantoprazole  (PROTONIX ) injection 40 mg (has no administration in time range)  HYDROmorphone  (DILAUDID ) injection 1 mg (has no administration in time range)  sodium chloride  0.9 % bolus 1,000 mL (has no administration in time range)                                    Medical Decision Making Amount and/or Complexity of Data Reviewed Labs: ordered.  Risk Prescription drug management. Decision regarding hospitalization.   This patient presents to the ED for concern of abdominal pain, this involves an extensive number of treatment options, and is a complaint that carries with it a high risk of complications and morbidity.  The differential diagnosis includes acute appendicitis, cholecystitis close bowel torsion, diverticulitis, testicular torsion, pyelonephritis, kidney stone, pancreatitis, mesenteric ischemia   Co morbidities that complicate the patient evaluation  Alcohol abuse and previous pancreatitis   Additional history obtained:  Additional history obtained from medical records External records from outside source obtained and  reviewed including medical records   Lab Tests:  I Ordered, and personally interpreted labs.  The pertinent results include: Mild leukocytosis, no anemia, normal kidney function, mild elevation of AST and total bilirubin, elevated lipase, negative alcohol    Consultations Obtained:  I requested consultation with the hospitalist,  and discussed lab and imaging findings as well as pertinent plan - they recommend: Admission   Problem List / ED Course / Critical interventions / Medication management  Patient is doing well at this time and does remain stable.  He does continue to have ongoing abdominal pain but vomiting has resolved.  He has been given an extra dose of pain medication at this time.  Blood work is consistent with pancreatitis.  Will forego imaging at this point as patient has had numerous previous CT scans and will avoid any further radiation at this time.  Do suspect that this is alcohol induced pancreatitis.  Patient's blood work is otherwise unremarkable at this point.  Vital signs are stable.  Do not suspect antibiotics are warranted.  He has been accepted for admission by Dr. JAYSON Carwin.  I ordered medication including IV fluids, Dilaudid , Reglan , Benadryl  for pancreatitis Reevaluation of the patient after these medicines showed that the patient improved I have reviewed the patients home medicines and have made adjustments as needed   Social Determinants of Health:  Alcohol abuse   Test / Admission - Considered:  Admission     Final diagnoses:  None    ED Discharge Orders     None          Daralene Lonni JONETTA DEVONNA 11/08/23 1606    Dean Clarity, MD 11/11/23 1706

## 2023-11-09 DIAGNOSIS — K852 Alcohol induced acute pancreatitis without necrosis or infection: Secondary | ICD-10-CM | POA: Diagnosis not present

## 2023-11-09 DIAGNOSIS — Z720 Tobacco use: Secondary | ICD-10-CM | POA: Diagnosis not present

## 2023-11-09 LAB — BASIC METABOLIC PANEL WITH GFR
Anion gap: 13 (ref 5–15)
BUN: 5 mg/dL — ABNORMAL LOW (ref 6–20)
CO2: 28 mmol/L (ref 22–32)
Calcium: 8.5 mg/dL — ABNORMAL LOW (ref 8.9–10.3)
Chloride: 92 mmol/L — ABNORMAL LOW (ref 98–111)
Creatinine, Ser: 0.48 mg/dL — ABNORMAL LOW (ref 0.61–1.24)
GFR, Estimated: >60 mL/min (ref >60)
Glucose, Bld: 116 mg/dL — ABNORMAL HIGH (ref 70–99)
Potassium: 4.1 mmol/L (ref 3.5–5.1)
Sodium: 133 mmol/L — ABNORMAL LOW (ref 135–145)

## 2023-11-09 LAB — GLUCOSE, CAPILLARY
Glucose-Capillary: 103 mg/dL — ABNORMAL HIGH (ref 70–99)
Glucose-Capillary: 133 mg/dL — ABNORMAL HIGH (ref 70–99)
Glucose-Capillary: 135 mg/dL — ABNORMAL HIGH (ref 70–99)
Glucose-Capillary: 90 mg/dL (ref 70–99)
Glucose-Capillary: 92 mg/dL (ref 70–99)

## 2023-11-09 NOTE — TOC Initial Note (Signed)
 Transition of Care [facility]) - Initial/Assessment Note    Patient Details  Name: Bryan Edwards MRN: [id number] Date of Birth: 26-Mar-1995  Transition of [facility]) CM/SW Contact:    Mcarthur Saddie Kim, LCSW Phone Number: 11/09/2023, 9:01 AM  Clinical Narrative: Pt admitted due to acute recurrent alcoholic pancreatitis. TOC received consult for substance abuse counseling. Pt reports he lives with his mother. He has transportation. Pt has no PCP- He states he has Medicaid and is unsure if he has PCP assigned. PCP list added to AVS in case needed. Pt indicates he has cut back on drinking recently. He does not feel it is currently a problem. Substance abuse treatment resources added to AVS.                   Expected Discharge Plan: Home/Self Care Barriers to Discharge: Continued Medical Work up   Patient Goals and CMS Choice Patient states their goals for this hospitalization and ongoing recovery are:: return home   Choice offered to / list presented to : Patient [place] ownership interest in [facility].provided to::  (n/a)    Expected Discharge Plan and Services In-house Referral: Clinical Social Work     Living arrangements for the past 2 months: Single Family Home                                      Prior Living Arrangements/Services Living arrangements for the past 2 months: Single Family Home Lives with:: Parents Patient language and need for interpreter reviewed:: Yes Do you feel safe going back to the place where you live?: Yes      Need for Family Participation in Patient Care: No (Comment)     Criminal Activity/Legal Involvement Pertinent to Current Situation/Hospitalization: No - Comment as needed  Activities of Daily Living   ADL Screening (condition at time of admission) Independently performs ADLs?: Yes (appropriate for developmental age) Is the patient deaf or have difficulty hearing?: No Does the patient have difficulty seeing, even  when wearing glasses/contacts?: No Does the patient have difficulty concentrating, remembering, or making decisions?: No  Permission Sought/Granted                  Emotional Assessment     Affect (typically observed): Appropriate Orientation: : Oriented to Self, Oriented to Place, Oriented to  Time, Oriented to Situation Alcohol / Substance Use: Alcohol Use Psych Involvement: No (comment)  Admission diagnosis:  Acute alcoholic pancreatitis [K85.20] Alcohol-induced acute pancreatitis, unspecified complication status [K85.20] Patient Active Problem List   Diagnosis Date Noted   Nausea & vomiting 08/03/2023   Pancreatitis 08/02/2023   Acute recurrent pancreatitis 05/28/2023   Hypokalemia 05/28/2023   Abdominal pain, epigastric 04/18/2023   Intussusception (HCC) 04/17/2023   Alcohol abuse 04/17/2023   Acute alcoholic pancreatitis 04/16/2023   Alcohol withdrawal (HCC) 01/09/2023   Tobacco abuse 01/09/2023   PCP:  Pcp, No Pharmacy:   [id number] [place], [place] - [address] AT [facility] [address] [place] [address] Phone: [phone] Fax: [phone]     Social Drivers of Health (SDOH) Social History: SDOH Screenings   Food Insecurity: No Food Insecurity (11/08/2023)  Housing: Low Risk  (11/08/2023)  Transportation Needs: No Transportation Needs (11/08/2023)  Utilities: Not At Risk (11/08/2023)  Tobacco Use: High Risk (11/08/2023)   SDOH Interventions:     Readmission Risk Interventions  11/09/2023    8:50 AM 08/06/2023    9:40 AM 08/05/2023   10:30 AM  Readmission Risk Prevention Plan  Transportation Screening Complete Complete Complete  Home Care Screening  Complete   Medication Review (RN CM)  Complete   HRI or Home Care Consult Complete  Complete  Social Work Consult for Recovery Care Planning/Counseling Complete  Complete  Palliative Care Screening Not Applicable  Not Applicable  Medication Review Oceanographer) Complete   Complete

## 2023-11-09 NOTE — Plan of Care (Signed)

## 2023-11-09 NOTE — Discharge Instructions (Signed)
 [facility] [phone] [address] [place], [place] [address] Call or walk in between 5:30am - 11:00Am   Crisis Mobile: Therapeutic Alternatives: [phone] (for crisis response 24 hours a day) [facility] [phone] Outpatient Substance Use Treatment Services [facility]  Chemical Dependence Intensive Outpatient Program [address] [place], [place] [address]  [phone] Private insurance, Medicare A&B, and [facility]  ADS (Alcohol and Drug Services)  [address],  [place], [place] [address] [phone] Medicaid, Self Pay  [facility]  [address]  [place], [place] [phone] Medicaid and [facility], Self Pay  [facility] [address]  [place], [place]  [phone] [facility], and Self Pay [facility]  [address]  [place], [place] [address]  [phone] or [phone] Private Insurance Only  [facility] [address]  [place], [place]  [phone] Medicaid, Medicare, Private Insurance [facility] Counseling Services at  the [facility] [address]  [place], [place] [address] [phone] Services are free or reduced [facility]  [address] [phone]  Self Pay only, sliding scale [facility]  [address]  [place], [place] [address] [phone] ([facility]) Self Pay, Medicaid Only  [facility] [address], [place] [address] [phone] Medicaid, Medicare, Private Insurance Crisis Mobile: Therapeutic Alternatives: [phone] (for crisis response 24 hours a day) [facility] [phone] Adolescent Substance Use Treatment Services [facility] [address]  [place], [place]  [phone] Self Pay Offer scholarships from the  [facility] to help pay for treatment  Website: http://www.lambert.com/ [facility] Adolescent Substance use Program Males ages: 12-17 Adolescent Substance use Program Females: 12-17 [facility] [address]  [place], [place] [address] (ph) [phone]  (fax) [phone] [facility]  [address]  [place], [place]  [address] (ph) [phone]  (fax) [phone] [facility] [address]  [place],  [place] [address] (ph) [phone]  (fax) [phone] [facility] [address], [place], [place]  [address] (ph) [phone]  (fax) [phone] Website: [email or web address].com/ [facility] Outpatient Substance Abuse Intensive Outpatient  Program for Adolescents Phone: [phone] Address: [address],  [place], [place] Website:  [email or web address]-[email or web address]/ [email or web address]-health/[email or web address]-[email or web address]/ Crisis Mobile: Therapeutic Alternatives: [phone] (for crisis response 24 hours a day) [facility] [phone] Residential Substance Use Treatment  Services [facility] ([facility]  [address]  [place], [place] [address]  [phone] or [phone] Detox (Medicare, Medicaid, private  insurance, and self pay)  Residential Rehab 14 days (Medicare,  Medicaid, private insurance, and self pay) RTS (Residential Treatment Services)  [address] [place], [place]  [phone]  Male and Male Detox (Self Pay and  Medicaid limited availability)  Rehab only Male (Medicaid and self pay only) [facility] [address]  [address]  [place], [place] [address]  [phone] or [phone] Detox and Residential Treatment Private Insurance Only  [facility]  [address]  [place], [place] [address]  [phone]  Treatment Only, must make assessment  appointment, and must be  sober for  assessment appointment.  Self Pay Only, Medicare A&B, [facility], [place] ID only! Youth worker offered from  [place] on [employer] [address] [place], [place] [address] Walk in interviews M-Sat 8-4p No pending legal charges  [phone] [facility]C: [facility] Referral  [address] [place], [place] [phone] (Self Pay, [facility]) [facility] [address] [place], [place] [address] [phone] Detox and Residential Treatment Medicare and Private Insurance [facility] [address]  [place], [place] [address] 28 Day Women's Facility: [phone] 28 Day Men's Facility: [phone] Long-term Residential Program:  [phone] Males 25 and Over (No Insurance, upfront fee) [facility]  [address] [place], [place] [address] [phone] Private Insurance with [employer], Private Pay [facility] [address] [place], [place] [address] Local [phone] Private Insurance Only [facility] [address]  [place], [place] [address]  [phone] (Males, upfront fee) [facility] [address]  [place], [address] [phone] Private Insurance  Crisis Mobile: Therapeutic Alternatives: [phone] (for crisis response 24 hours a day) [facility] [phone] [place]  [facility] Locations [facility]  [address]  [place], [place]  [phone] [facility] Based Program for individuals  experiencing homelessness Self Pay, No insurance [facility]  Men's program: [facility] [address] [place], [place] [address] [phone] [facility] Women's program: [facility] [address] [place], [place] [address] [phone] Christian Based Program for individuals  experiencing homelessness Self Pay, No insurance [facility] Men's Division [address] [place], [place] [address]  [phone] [facility]lean Based  Program for individuals  experiencing homelessness Self Pay, No insurance [facility] Division [address] [place], [place] [address] [phone] [facility]le[facility] Based Program for individuals  experiencing homelessness Self Pay, No insurance [facility] [address] [place], [place] [phone] [facility]lean Based Program for males  experiencing homelessness Self Pay, No insurance   Providers Accepting New Patients in [place], [place]    [facility] [address]  [place], [place] [address] [phone] Accepts most insurances  [facility] Internal Medicine [address] [place], [place] [address] [phone] Accepts most insurances  [facility] [address] [place], [place] [address]  [phone] Must meet requirements  [facility] [address] [place], [place] [address] [phone] Accepts most insurances  [facility] [address]  [place], [place] [address] [phone] Accepts most insurances  [facility] [address]   [place], [place]   [phone] Accepts most insurances  [facility] Writer Medical Office Building)  [phone] [address]  [place], [place] [address] [phone] Accepts most insurances     [facility] [address]  [place], [place] [address] [phone] Accepts most insurances  [facility] Department [address] [place], [place] [address] [phone] option 1 Accepts Medicaid and [facility] Internal Medicine [address]  [place], [place] [address] [phone] Accepts most insurances  Benita Outhouse, MD [address] [place], [place] [address] [phone] Accepts most insurances  [facility] [address]  [place], [place] [address] [phone] Accepts most insurances  [facility] [phone] [address] [place], [place] [address] [phone] Accepts most insurances  [place], [place] [address], [address] [place], [place] [address] [phone]  Accepts most insurances

## 2023-11-09 NOTE — Progress Notes (Addendum)
 PROGRESS NOTE  Bryan Edwards, is a 28 y.o. male, DOB - 04-01-1995, FMW:[id number]  Admit date - 11/08/2023   Admitting Physician Infantof Villagomez Pearlean, MD  Outpatient Primary MD for the patient is Pcp, No  LOS - 1  Chief Complaint  Patient presents with   Abdominal Pain   Brief Narrative:  28 y.o. male with medical history significant for  Ongoing alcohol and tobacco abuse and history of recurrent acute on chronic pancreatitis with frequent admissions for same presents admitted on 11/08/2023 with flareup of acute on chronic alcoholic pancreatitis with persistent abdominal pain nausea and vomiting consistent with his usual presentation for pancreatitis flareup--- after drinking alcohol    -Assessment and Plan: 1)Acute recurrent alcoholic pancreatitis: -history of recurrent acute on chronic pancreatitis with frequent admissions for same  Lipase 627, it was 206 on 08/27/2023 - Patient has had frequent abdominal imaging -Consider repeat abdominal imaging only if symptoms fail to improve as anticipated -Continue IV fluids -Try clear liquids -PRN Antiemetics and opiates for pain control   2)Alcohol abuse--high risk for DTs as patient is trying to reduce amount of alcoholic intake lately C/n Benzos per CIWA protocol C/n Folic-acid, thiamine  multivitamin as ordered   3)Tobacco abuse -Smoking cessation advised - Nicotine  patches ordered   4)Hyperbilirubinemia:  - Not new patient has recurrent episodes of same - Recent abdominal MRI/MRCP without acute findings  5) hyponatremia/hypochloremia---due to GI losses and dehydration --patient had persistent vomiting prior to admission and poor oral intake - Give trial of clear liquids - Continue IV fluids  Status is: Inpatient   Disposition: The patient is from: Home              Anticipated d/c is to: Home              Anticipated d/c date is: 2 days              Patient currently is not medically stable to d/c. Barriers: Not Clinically  Stable-   Code Status :  -  Code Status: Full Code   Family Communication:    NA (patient is alert, awake and coherent)   DVT Prophylaxis  :   - SCDs  enoxaparin  (LOVENOX ) injection 40 mg Start: 11/08/23 2200 SCDs Start: 11/08/23 1627 Place TED hose Start: 11/08/23 1627   Lab Results  Component Value Date   PLT 160 11/08/2023    Inpatient Medications  Scheduled Meds:  diazepam   2 mg Oral TID   enoxaparin  (LOVENOX ) injection  40 mg Subcutaneous Q24H   folic acid   1 mg Oral Daily   hydrOXYzine   25 mg Oral TID   multivitamin with minerals  1 tablet Oral Daily   nicotine   21 mg Transdermal Daily   pantoprazole   40 mg Oral BID AC   sodium chloride  flush  3 mL Intravenous Q12H   sodium chloride  flush  3 mL Intravenous Q12H   sucralfate   1 g Oral TID WC & HS   thiamine   100 mg Oral Daily   Or   thiamine   100 mg Intravenous Daily   Continuous Infusions:  sodium chloride      sodium chloride  139 mL/hr at 11/09/23 0550   PRN Meds:.sodium chloride , acetaminophen  **OR** acetaminophen , bisacodyl , HYDROmorphone  (DILAUDID ) injection, labetalol , LORazepam  **OR** LORazepam , ondansetron  **OR** ondansetron  (ZOFRAN ) IV, polyethylene glycol, sodium chloride  flush   Anti-infectives (From admission, onward)    None       Subjective: Bryan Edwards today has no fevers,  No chest pain,   -  No further emesis - Improving abdominal pain -Voiding well --Willing to try clear liquids   Objective: Vitals:   11/09/23 0646 11/09/23 1012 11/09/23 1039 11/09/23 1219  BP: (!) 140/99 (!) 130/104 (!) 130/104   Pulse: 84 (!) 102 (!) 122 (!) 104  Resp:  19 11   Temp:  98.7 F (37.1 C) 98.7 F (37.1 C)   TempSrc:  Oral Oral   SpO2:  98% 98%   Weight:      Height:        Intake/Output Summary (Last 24 hours) at 11/09/2023 1221 Last data filed at 11/09/2023 9385 Gross per 24 hour  Intake 722.5 ml  Output 60 ml  Net 662.5 ml   Filed Weights   11/08/23 1334  Weight: 60.8 kg     Physical Exam Gen:- Awake Alert, in no acute distress HEENT:- [place].AT, No sclera icterus Neck-Supple Neck,No JVD,.  Lungs-  CTAB , fair symmetrical air movement CV- S1, S2 normal, regular  Abd-  +ve B.Sounds, Abd Soft, No tenderness,    Extremity/Skin:- No  edema, pedal pulses present  Psych-affect is appropriate, oriented x3 Neuro-no new focal deficits, mild tremors  Data Reviewed: I have personally reviewed following labs and imaging studies  CBC: Recent Labs  Lab 11/08/23 1358  WBC 13.3*  HGB 16.9  HCT 48.2  MCV 90.6  PLT 160   Basic Metabolic Panel: Recent Labs  Lab 11/08/23 1358 11/09/23 0459  NA 135 133*  K 3.8 4.1  CL 93* 92*  CO2 22 28  GLUCOSE 89 116*  BUN 9 5*  CREATININE 0.68 0.48*  CALCIUM 9.8 8.5*   GFR: Estimated Creatinine Clearance: 116.1 mL/min (A) (by C-G formula based on SCr of 0.48 mg/dL (L)). Liver Function Tests: Recent Labs  Lab 11/08/23 1358  AST 49*  ALT 41  ALKPHOS 97  BILITOT 1.8*  PROT 8.1  ALBUMIN 5.2*   Scheduled Meds:  diazepam   2 mg Oral TID   enoxaparin  (LOVENOX ) injection  40 mg Subcutaneous Q24H   folic acid   1 mg Oral Daily   hydrOXYzine   25 mg Oral TID   multivitamin with minerals  1 tablet Oral Daily   nicotine   21 mg Transdermal Daily   pantoprazole   40 mg Oral BID AC   sodium chloride  flush  3 mL Intravenous Q12H   sodium chloride  flush  3 mL Intravenous Q12H   sucralfate   1 g Oral TID WC & HS   thiamine   100 mg Oral Daily   Or   thiamine   100 mg Intravenous Daily   Continuous Infusions:  sodium chloride      sodium chloride  139 mL/hr at 11/09/23 0550    LOS: 1 day   Bryan Edwards M.D on 11/09/2023 at 12:21 PM  Go to [email or web address] - for contact info  [facility] - Office  [phone]  If 7PM-7AM, please contact night-coverage [email or web address] 11/09/2023, 12:21 PM

## 2023-11-09 NOTE — Plan of Care (Signed)
   Problem: Education: Goal: Knowledge of General Education information will improve Description Including pain rating scale, medication(s)/side effects and non-pharmacologic comfort measures Outcome: Progressing   Problem: Education: Goal: Knowledge of General Education information will improve Description Including pain rating scale, medication(s)/side effects and non-pharmacologic comfort measures Outcome: Progressing

## 2023-11-10 DIAGNOSIS — K852 Alcohol induced acute pancreatitis without necrosis or infection: Secondary | ICD-10-CM | POA: Diagnosis not present

## 2023-11-10 DIAGNOSIS — Z720 Tobacco use: Secondary | ICD-10-CM | POA: Diagnosis not present

## 2023-11-10 LAB — CBC
HCT: 39.7 % (ref 39.0–52.0)
Hemoglobin: 13.6 g/dL (ref 13.0–17.0)
MCH: 32.2 pg (ref 26.0–34.0)
MCHC: 34.3 g/dL (ref 30.0–36.0)
MCV: 94.1 fL (ref 80.0–100.0)
Platelets: 94 K/uL — ABNORMAL LOW (ref 150–400)
RBC: 4.22 MIL/uL (ref 4.22–5.81)
RDW: 13.3 % (ref 11.5–15.5)
WBC: 5.8 K/uL (ref 4.0–10.5)
nRBC: 0 % (ref 0.0–0.2)

## 2023-11-10 LAB — RENAL FUNCTION PANEL
Albumin: 3.7 g/dL (ref 3.5–5.0)
Anion gap: 10 (ref 5–15)
BUN: 7 mg/dL (ref 6–20)
CO2: 26 mmol/L (ref 22–32)
Calcium: 8.8 mg/dL — ABNORMAL LOW (ref 8.9–10.3)
Chloride: 101 mmol/L (ref 98–111)
Creatinine, Ser: 0.65 mg/dL (ref 0.61–1.24)
GFR, Estimated: >60 mL/min (ref >60)
Glucose, Bld: 74 mg/dL (ref 70–99)
Phosphorus: 2.2 mg/dL — ABNORMAL LOW (ref 2.5–4.6)
Potassium: 3.7 mmol/L (ref 3.5–5.1)
Sodium: 137 mmol/L (ref 135–145)

## 2023-11-10 LAB — GLUCOSE, CAPILLARY
Glucose-Capillary: 65 mg/dL — ABNORMAL LOW (ref 70–99)
Glucose-Capillary: 77 mg/dL (ref 70–99)
Glucose-Capillary: 79 mg/dL (ref 70–99)
Glucose-Capillary: 93 mg/dL (ref 70–99)

## 2023-11-10 MED ORDER — DEXTROSE-SODIUM CHLORIDE 5-0.9 % IV SOLN: INTRAVENOUS | Status: AC

## 2023-11-10 MED ORDER — OXYCODONE HCL 5 MG PO TABS
10.0000 mg | ORAL_TABLET | ORAL | Status: DC | PRN
  Administered 2023-11-10 – 2023-11-11 (×3): 10 mg via ORAL
  Filled 2023-11-10 (×3): qty 2

## 2023-11-10 MED ORDER — SODIUM CHLORIDE 0.9 % IV SOLN: INTRAVENOUS | Status: DC

## 2023-11-10 MED ORDER — HYDROXYZINE HCL 25 MG PO TABS
25.0000 mg | ORAL_TABLET | Freq: Three times a day (TID) | ORAL | Status: DC | PRN
  Administered 2023-11-10: 25 mg via ORAL
  Filled 2023-11-10: qty 1

## 2023-11-10 MED ORDER — DIAZEPAM 2 MG PO TABS
2.0000 mg | ORAL_TABLET | Freq: Three times a day (TID) | ORAL | Status: AC
  Administered 2023-11-10 – 2023-11-11 (×2): 2 mg via ORAL
  Filled 2023-11-10 (×2): qty 1

## 2023-11-10 NOTE — Progress Notes (Signed)
 PROGRESS NOTE  Bryan Edwards, is a 28 y.o. male, DOB - 06-Apr-1995, FMW:[id number]  Admit date - 11/08/2023   Admitting Physician Danissa Rundle Pearlean, MD  Outpatient Primary MD for the patient is Pcp, No  LOS - 2  Chief Complaint  Patient presents with   Abdominal Pain   Brief Narrative:  28 y.o. male with medical history significant for  Ongoing alcohol and tobacco abuse and history of recurrent acute on chronic pancreatitis with frequent admissions for same presents admitted on 11/08/2023 with flareup of acute on chronic alcoholic pancreatitis with persistent abdominal pain nausea and vomiting consistent with his usual presentation for pancreatitis flareup--- after drinking alcohol   -Assessment and Plan: 1)Acute recurrent alcoholic pancreatitis: -history of recurrent acute on chronic pancreatitis with frequent admissions for same  Lipase 627, it was 206 on 08/27/2023 - Patient has had frequent abdominal imaging -Consider repeat abdominal imaging only if symptoms fail to improve as anticipated 11/10/23 -No further emesis, abdominal pain improving -Change IV fluids to dextrose  due to concerns for hypoglycemia risk in the setting of restricted diet -Patient is willing to try full liquid diet, he tolerated clear liquids well over the last 24 hours -PRN Antiemetics and opiates for pain control -Leukocytosis has normalized - Weaned off of IV Dilaudid  and transition to prn oral pain medications only -- Anticipate discharge home in 1 to 2 days if continues to improve   2)Alcohol abuse--high risk for DTs as patient is trying to reduce amount of alcoholic intake lately C/n Benzos per CIWA protocol C/n Folic-acid, thiamine  multivitamin as ordered   3)Tobacco abuse -Smoking cessation advised - Nicotine  patches ordered   4)Hyperbilirubinemia:  - Not new patient has recurrent episodes of same - Recent abdominal MRI/MRCP without acute findings  5) hyponatremia/hypochloremia---due to GI  losses and dehydration --patient had persistent vomiting prior to admission and poor oral intake - Give trial of clear liquids - Continue IV fluids  Status is: Inpatient   Disposition: The patient is from: Home              Anticipated d/c is to: Home              Anticipated d/c date is: 1 day              Patient currently is not medically stable to d/c. Barriers: Not Clinically Stable-   Code Status :  -  Code Status: Full Code   Family Communication:    NA (patient is alert, awake and coherent)   DVT Prophylaxis  :   - SCDs  enoxaparin  (LOVENOX ) injection 40 mg Start: 11/08/23 2200 SCDs Start: 11/08/23 1627 Place TED hose Start: 11/08/23 1627   Lab Results  Component Value Date   PLT 94 (L) 11/10/2023   Inpatient Medications  Scheduled Meds:  diazepam   2 mg Oral TID   enoxaparin  (LOVENOX ) injection  40 mg Subcutaneous Q24H   folic acid   1 mg Oral Daily   multivitamin with minerals  1 tablet Oral Daily   nicotine   21 mg Transdermal Daily   pantoprazole   40 mg Oral BID AC   sodium chloride  flush  3 mL Intravenous Q12H   sodium chloride  flush  3 mL Intravenous Q12H   sucralfate   1 g Oral TID WC & HS   thiamine   100 mg Oral Daily   Or   thiamine   100 mg Intravenous Daily   Continuous Infusions:  dextrose  5 % and 0.9 % NaCl     PRN  Meds:.acetaminophen  **OR** acetaminophen , bisacodyl , HYDROmorphone  (DILAUDID ) injection, labetalol , LORazepam  **OR** LORazepam , ondansetron  **OR** ondansetron  (ZOFRAN ) IV, polyethylene glycol, sodium chloride  flush   Anti-infectives (From admission, onward)    None       Subjective: Lynwood Mani today has no fevers,  No chest pain,   - No further emesis - Improving abdominal pain - Patient would like to advance diet to full liquids   Objective: Vitals:   11/09/23 1948 11/09/23 2007 11/10/23 0526 11/10/23 1227  BP: (!) 132/99 (!) 122/93 (!) 129/95 131/85  Pulse: 100 96 93 93  Resp: 16  15 16   Temp: 97.8 F (36.6 C) 98.2  F (36.8 C) 97.9 F (36.6 C) 98.3 F (36.8 C)  TempSrc: Oral Oral Oral Oral  SpO2: 95% 96% 97% 99%  Weight:      Height:        Intake/Output Summary (Last 24 hours) at 11/10/2023 1831 Last data filed at 11/10/2023 1500 Gross per 24 hour  Intake 480 ml  Output --  Net 480 ml   Filed Weights   11/08/23 1334  Weight: 60.8 kg    Physical Exam Gen:- Awake Alert, in no acute distress HEENT:- [place].AT, No sclera icterus Neck-Supple Neck,No JVD,.  Lungs-  CTAB , fair symmetrical air movement CV- S1, S2 normal, regular  Abd-  +ve B.Sounds, Abd Soft, No tenderness,    Extremity/Skin:- No  edema, pedal pulses present  Psych-affect is appropriate, oriented x3 Neuro-no new focal deficits, improved tremors  Data Reviewed: I have personally reviewed following labs and imaging studies  CBC: Recent Labs  Lab 11/08/23 1358 11/10/23 0644  WBC 13.3* 5.8  HGB 16.9 13.6  HCT 48.2 39.7  MCV 90.6 94.1  PLT 160 94*   Basic Metabolic Panel: Recent Labs  Lab 11/08/23 1358 11/09/23 0459 11/10/23 0434  NA 135 133* 137  K 3.8 4.1 3.7  CL 93* 92* 101  CO2 22 28 26   GLUCOSE 89 116* 74  BUN 9 5* 7  CREATININE 0.68 0.48* 0.65  CALCIUM 9.8 8.5* 8.8*  PHOS  --   --  2.2*   GFR: Estimated Creatinine Clearance: 116.1 mL/min (by C-G formula based on SCr of 0.65 mg/dL). Liver Function Tests: Recent Labs  Lab 11/08/23 1358 11/10/23 0434  AST 49*  --   ALT 41  --   ALKPHOS 97  --   BILITOT 1.8*  --   PROT 8.1  --   ALBUMIN 5.2* 3.7   Scheduled Meds:  diazepam   2 mg Oral TID   enoxaparin  (LOVENOX ) injection  40 mg Subcutaneous Q24H   folic acid   1 mg Oral Daily   multivitamin with minerals  1 tablet Oral Daily   nicotine   21 mg Transdermal Daily   pantoprazole   40 mg Oral BID AC   sodium chloride  flush  3 mL Intravenous Q12H   sodium chloride  flush  3 mL Intravenous Q12H   sucralfate   1 g Oral TID WC & HS   thiamine   100 mg Oral Daily   Or   thiamine   100 mg Intravenous Daily    Continuous Infusions:  dextrose  5 % and 0.9 % NaCl      LOS: 2 days   Rendall Carwin M.D on 11/10/2023 at 6:31 PM  Go to [email or web address] - for contact info  [facility] - Office  [phone]  If 7PM-7AM, please contact night-coverage [email or web address] 11/10/2023, 6:31 PM

## 2023-11-10 NOTE — Plan of Care (Signed)

## 2023-11-11 DIAGNOSIS — F1010 Alcohol abuse, uncomplicated: Secondary | ICD-10-CM | POA: Diagnosis not present

## 2023-11-11 DIAGNOSIS — Z720 Tobacco use: Secondary | ICD-10-CM | POA: Diagnosis not present

## 2023-11-11 DIAGNOSIS — K852 Alcohol induced acute pancreatitis without necrosis or infection: Secondary | ICD-10-CM | POA: Diagnosis not present

## 2023-11-11 DIAGNOSIS — K219 Gastro-esophageal reflux disease without esophagitis: Secondary | ICD-10-CM | POA: Diagnosis not present

## 2023-11-11 LAB — GLUCOSE, CAPILLARY
Glucose-Capillary: 88 mg/dL (ref 70–99)
Glucose-Capillary: 143 mg/dL — ABNORMAL HIGH (ref 70–99)

## 2023-11-11 MED ORDER — PANTOPRAZOLE SODIUM 40 MG PO TBEC: 40.0000 mg | DELAYED_RELEASE_TABLET | Freq: Two times a day (BID) | ORAL | 2 refills | Status: AC

## 2023-11-11 MED ORDER — SUCRALFATE 1 G PO TABS: 1.0000 g | ORAL_TABLET | Freq: Three times a day (TID) | ORAL | 2 refills | Status: AC

## 2023-11-11 MED ORDER — ACETAMINOPHEN 500 MG PO TABS: 1000.0000 mg | ORAL_TABLET | Freq: Three times a day (TID) | ORAL | Status: AC | PRN

## 2023-11-11 MED ORDER — ADULT MULTIVITAMIN W/MINERALS CH: 1.0000 | ORAL_TABLET | Freq: Every day | ORAL | 3 refills | Status: AC

## 2023-11-11 MED ORDER — FOLIC ACID 1 MG PO TABS: 1.0000 mg | ORAL_TABLET | Freq: Every day | ORAL | 3 refills | Status: DC

## 2023-11-11 MED ORDER — OXYCODONE HCL 10 MG PO TABS: 10.0000 mg | ORAL_TABLET | Freq: Four times a day (QID) | ORAL | 0 refills | Status: DC | PRN

## 2023-11-11 MED ORDER — ONDANSETRON 8 MG PO TBDP: 8.0000 mg | ORAL_TABLET | Freq: Three times a day (TID) | ORAL | 0 refills | Status: DC | PRN

## 2023-11-11 MED ORDER — POLYETHYLENE GLYCOL 3350 17 G PO PACK: 17.0000 g | PACK | Freq: Every day | ORAL | 0 refills | Status: AC | PRN

## 2023-11-11 MED ORDER — VITAMIN B-1 100 MG PO TABS: 100.0000 mg | ORAL_TABLET | Freq: Every day | ORAL | 3 refills | Status: AC

## 2023-11-11 NOTE — Plan of Care (Signed)

## 2023-11-11 NOTE — Discharge Summary (Signed)
 Physician Discharge Summary   Patient: Bryan Edwards MRN: [id number] DOB: [birth year]  Admit date:     11/08/2023  Discharge date: 11/11/23  Discharge Physician: Eric Nunnery   PCP: Pcp, No   Recommendations at discharge:  Repeat complete metabolic panel to follow electrolytes, renal function and LFTs Continue assisting patient with alcohol and tobacco cessation   Discharge Diagnoses: Principal Problem:   Acute alcoholic pancreatitis Active Problems:   Alcohol abuse   Tobacco abuse   Gastroesophageal reflux disease  Brief Hospital admission narrative: 28 y.o. male with medical history significant for  Ongoing alcohol and tobacco abuse and history of recurrent acute on chronic pancreatitis with frequent admissions for same presents admitted on 11/08/2023 with flareup of acute on chronic alcoholic pancreatitis with persistent abdominal pain nausea and vomiting consistent with his usual presentation for pancreatitis flareup--- after drinking alcohol  Assessment and Plan: 1)Acute recurrent alcoholic pancreatitis: -history of recurrent acute on chronic pancreatitis with frequent admissions for same  Lipase 627, it was 206 on 08/27/2023 - Patient has had frequent abdominal imaging -Consider repeat abdominal imaging only if symptoms fail to improve as anticipated 11/10/23 -No further emesis, abdominal pain improving - Patient diet transition to oral route - As needed antiemetics and analgesics has been prescribed - Continue the use of PPI and Carafate  - Complete alcohol cessation recommended along with advice is to follow low-fat diet.   2)Alcohol abuse--high risk for DTs as patient is trying to reduce amount of alcoholic intake lately No active withdrawal-throughout hospitalization - Continue folic acid , thiamine  and multivitamin - Outpatient resources provided to help him quit.   3)Tobacco abuse -Smoking cessation advised - Nicotine  patches advised.   4)Hyperbilirubinemia:  -  Not new patient has recurrent episodes of same - Recent abdominal MRI/MRCP without acute findings -Continue to follow bilirubin/LFTs as an outpatient.   5) hyponatremia/hypochloremia---due to GI losses and dehydration -Improved/stabilized at time of discharge - Advised to maintain adequate hydration and oral intake.  6) GERD - Continue the use of PPI and Carafate  at discharge - Lifestyle changes discussed with patient.  Consultants: None Procedures performed: See below for x-ray reports. Disposition: Home Diet recommendation: Heart healthy/low-fat diet.  DISCHARGE MEDICATION: Allergies as of 11/11/2023       Reactions   Other Other (See Comments)   Had a bad reaction to a shot in the stomach that was a blood thinner Sore R-thigh turned black and blue from shot   Amoxicillin Other (See Comments)   Makes tongue and throat hurt, dries it out   Bee Pollen Other (See Comments)   Seasonal allergies   Penicillins Swelling        Medication List     TAKE these medications    acetaminophen  500 MG tablet Commonly known as: TYLENOL  Take 2 tablets (1,000 mg total) by mouth every 8 (eight) hours as needed for mild pain (pain score 1-3), fever or headache.   folic acid  1 MG tablet Commonly known as: FOLVITE  Take 1 tablet (1 mg total) by mouth daily. Start taking on: November 12, 2023   multivitamin with minerals Tabs tablet Take 1 tablet by mouth daily. Start taking on: November 12, 2023   nicotine  21 mg/24hr patch Commonly known as: NICODERM CQ  - dosed in mg/24 hours Place 1 patch (21 mg total) onto the skin daily.   ondansetron  8 MG disintegrating tablet Commonly known as: ZOFRAN -ODT Take 1 tablet (8 mg total) by mouth every 8 (eight) hours as needed.   Oxycodone  HCl 10  MG Tabs Take 1 tablet (10 mg total) by mouth every 6 (six) hours as needed for severe pain (pain score 7-10).   pantoprazole  40 MG tablet Commonly known as: PROTONIX  Take 1 tablet (40 mg total)  by mouth 2 (two) times daily before a meal.   polyethylene glycol 17 g packet Commonly known as: MIRALAX  / GLYCOLAX  Take 17 g by mouth daily as needed for mild constipation.   sucralfate  1 g tablet Commonly known as: Carafate  Take 1 tablet (1 g total) by mouth in the morning, at noon, and at bedtime. What changed: when to take this   thiamine  100 MG tablet Commonly known as: Vitamin B-1 Take 1 tablet (100 mg total) by mouth daily. Start taking on: November 12, 2023        Follow-up Information     [facility] Follow up.   Why: [facility] [phone] [address] [place], [place] [address] Call or walk in between 5:30am - 11:00Am               Discharge Exam: Filed Weights   11/08/23 1334  Weight: 60.8 kg   General exam: Alert, awake, oriented x 3 Respiratory system: Clear to auscultation. Respiratory effort normal. Cardiovascular system:RRR. No murmurs, rubs, gallops. Gastrointestinal system: Abdomen is nondistended, soft and nontender. No organomegaly or masses felt. Normal bowel sounds heard. Central nervous system: Alert and oriented. No focal neurological deficits. Extremities: No C/C/E, +pedal pulses Skin: No rashes, lesions or ulcers Psychiatry: Judgement and insight appear normal. Mood & affect appropriate.    Condition at discharge: Stable and improved.  The results of significant diagnostics from this hospitalization (including imaging, microbiology, ancillary and laboratory) are listed below for reference.   Imaging Studies: No results found.  Microbiology: Results for orders placed or performed during the hospital encounter of 01/09/23  Culture, blood (Routine X 2) w Reflex to ID Panel     Status: None   Collection Time: 01/11/23  9:38 PM   Specimen: Left Antecubital; Blood  Result Value Ref Range Status   Specimen Description LEFT ANTECUBITAL  Final   Special  Requests   Final    BOTTLES DRAWN AEROBIC AND ANAEROBIC Blood Culture adequate volume   Culture   Final    NO GROWTH 5 DAYS Performed at [facility], [address] [place], [place] [address]    Report Status 01/16/2023 FINAL  Final  Culture, blood (Routine X 2) w Reflex to ID Panel     Status: None   Collection Time: 01/11/23  9:49 PM   Specimen: BLOOD RIGHT FOREARM  Result Value Ref Range Status   Specimen Description BLOOD RIGHT FOREARM  Final   Special Requests   Final    BOTTLES DRAWN AEROBIC AND ANAEROBIC Blood Culture adequate volume   Culture  Setup Time NO ORGANISMS SEEN ANAEROBIC BOTTLE ONLY   Final   Culture   Final    NO GROWTH 5 DAYS Performed at [facility], [address] [place], [place] [address]    Report Status 01/16/2023 FINAL  Final    Labs: CBC: Recent Labs  Lab 11/08/23 1358 11/10/23 0644  WBC 13.3* 5.8  HGB 16.9 13.6  HCT 48.2 39.7  MCV 90.6 94.1  PLT 160 94*   Basic Metabolic Panel: Recent Labs  Lab 11/08/23 1358 11/09/23 0459 11/10/23 0434  NA 135 133* 137  K 3.8 4.1 3.7  CL 93* 92* 101  CO2 22 28 26  GLUCOSE 89 116* 74  BUN 9 5* 7  CREATININE 0.68 0.48* 0.65  CALCIUM 9.8 8.5* 8.8*  PHOS  --   --  2.2*   Liver Function Tests: Recent Labs  Lab 11/08/23 1358 11/10/23 0434  AST 49*  --   ALT 41  --   ALKPHOS 97  --   BILITOT 1.8*  --   PROT 8.1  --   ALBUMIN 5.2* 3.7   CBG: Recent Labs  Lab 11/10/23 1108 11/10/23 1751 11/10/23 2338 11/11/23 0514 11/11/23 1151  GLUCAP 77 65* 93 88 143*    Discharge time spent:  35 minutes. Signed: Eric Nunnery, MD [facility] 11/11/2023

## 2024-01-20 ENCOUNTER — Other Ambulatory Visit: Payer: Self-pay

## 2024-01-20 ENCOUNTER — Emergency Department (HOSPITAL_COMMUNITY)

## 2024-01-20 ENCOUNTER — Inpatient Hospital Stay (HOSPITAL_COMMUNITY)
Admission: EM | Admit: 2024-01-20 | Discharge: 2024-01-23 | DRG: 440 | Disposition: A | Discharge status: Home / Self Care | Attending: Internal Medicine | Admitting: Internal Medicine

## 2024-01-20 ENCOUNTER — Encounter (HOSPITAL_COMMUNITY): Payer: Self-pay

## 2024-01-20 DIAGNOSIS — K852 Alcohol induced acute pancreatitis without necrosis or infection: Secondary | ICD-10-CM | POA: Diagnosis not present

## 2024-01-20 DIAGNOSIS — K859 Acute pancreatitis without necrosis or infection, unspecified: Secondary | ICD-10-CM | POA: Diagnosis present

## 2024-01-20 LAB — URINALYSIS, ROUTINE W REFLEX MICROSCOPIC
Bacteria, UA: NONE SEEN
Bilirubin Urine: NEGATIVE
Glucose, UA: NEGATIVE mg/dL
Ketones, ur: NEGATIVE mg/dL
Leukocytes,Ua: NEGATIVE
Nitrite: NEGATIVE
Protein, ur: NEGATIVE mg/dL
Specific Gravity, Urine: 1.003 — ABNORMAL LOW (ref 1.005–1.030)
pH: 6 (ref 5.0–8.0)

## 2024-01-20 LAB — COMPREHENSIVE METABOLIC PANEL WITH GFR
ALT: 32 U/L (ref 0–44)
AST: 56 U/L — ABNORMAL HIGH (ref 15–41)
Albumin: 4.8 g/dL (ref 3.5–5.0)
Alkaline Phosphatase: 135 U/L — ABNORMAL HIGH (ref 38–126)
Anion gap: 13 (ref 5–15)
BUN: 11 mg/dL (ref 6–20)
CO2: 27 mmol/L (ref 22–32)
Calcium: 8.9 mg/dL (ref 8.9–10.3)
Chloride: 104 mmol/L (ref 98–111)
Creatinine, Ser: 0.89 mg/dL (ref 0.61–1.24)
GFR, Estimated: >60 mL/min (ref >60)
Glucose, Bld: 86 mg/dL (ref 70–99)
Potassium: 4.1 mmol/L (ref 3.5–5.1)
Sodium: 144 mmol/L (ref 135–145)
Total Bilirubin: 0.4 mg/dL (ref 0.0–1.2)
Total Protein: 7 g/dL (ref 6.5–8.1)

## 2024-01-20 LAB — CBC
HCT: 46.3 % (ref 39.0–52.0)
Hemoglobin: 16.2 g/dL (ref 13.0–17.0)
MCH: 32.5 pg (ref 26.0–34.0)
MCHC: 35 g/dL (ref 30.0–36.0)
MCV: 93 fL (ref 80.0–100.0)
Platelets: 235 K/uL (ref 150–400)
RBC: 4.98 MIL/uL (ref 4.22–5.81)
RDW: 13 % (ref 11.5–15.5)
WBC: 8.1 K/uL (ref 4.0–10.5)
nRBC: 0 % (ref 0.0–0.2)

## 2024-01-20 LAB — LIPASE, BLOOD: Lipase: 151 U/L — ABNORMAL HIGH (ref 11–51)

## 2024-01-20 LAB — ETHANOL: Alcohol, Ethyl (B): 377 mg/dL (ref <15)

## 2024-01-20 MED ORDER — ENOXAPARIN SODIUM 40 MG/0.4ML IJ SOSY
40.0000 mg | PREFILLED_SYRINGE | INTRAMUSCULAR | Status: DC
  Administered 2024-01-21 – 2024-01-23 (×3): 40 mg via SUBCUTANEOUS
  Filled 2024-01-20 (×3): qty 0.4

## 2024-01-20 MED ORDER — FOLIC ACID 1 MG PO TABS
1.0000 mg | ORAL_TABLET | Freq: Every day | ORAL | Status: DC
  Administered 2024-01-21 – 2024-01-23 (×3): 1 mg via ORAL
  Filled 2024-01-20 (×3): qty 1

## 2024-01-20 MED ORDER — MELATONIN 5 MG PO TABS: 5.0000 mg | ORAL_TABLET | Freq: Every evening | ORAL | Status: DC | PRN

## 2024-01-20 MED ORDER — HYDROMORPHONE HCL 1 MG/ML IJ SOLN
0.5000 mg | INTRAMUSCULAR | Status: AC
  Administered 2024-01-20: 0.5 mg via INTRAVENOUS
  Filled 2024-01-20: qty 0.5

## 2024-01-20 MED ORDER — LACTATED RINGERS IV SOLN: INTRAVENOUS | Status: AC

## 2024-01-20 MED ORDER — IOHEXOL 300 MG/ML  SOLN
100.0000 mL | Freq: Once | INTRAMUSCULAR | Status: AC | PRN
  Administered 2024-01-20: 100 mL via INTRAVENOUS

## 2024-01-20 MED ORDER — SODIUM CHLORIDE 0.9 % IV BOLUS
1000.0000 mL | Freq: Once | INTRAVENOUS | Status: AC
  Administered 2024-01-20: 1000 mL via INTRAVENOUS

## 2024-01-20 MED ORDER — LORAZEPAM 1 MG PO TABS
1.0000 mg | ORAL_TABLET | ORAL | Status: DC | PRN
  Administered 2024-01-21: 1 mg via ORAL
  Administered 2024-01-21: 2 mg via ORAL
  Administered 2024-01-21: 4 mg via ORAL
  Administered 2024-01-21 (×2): 1 mg via ORAL
  Administered 2024-01-21: 4 mg via ORAL
  Administered 2024-01-22 (×3): 2 mg via ORAL
  Administered 2024-01-23: 1 mg via ORAL
  Filled 2024-01-20 (×2): qty 2
  Filled 2024-01-20 (×3): qty 1
  Filled 2024-01-20 (×2): qty 4
  Filled 2024-01-20: qty 1
  Filled 2024-01-20 (×2): qty 2

## 2024-01-20 MED ORDER — PANTOPRAZOLE SODIUM 40 MG IV SOLR
40.0000 mg | Freq: Two times a day (BID) | INTRAVENOUS | Status: DC
  Administered 2024-01-21 – 2024-01-23 (×5): 40 mg via INTRAVENOUS
  Filled 2024-01-20 (×6): qty 10

## 2024-01-20 MED ORDER — ADULT MULTIVITAMIN W/MINERALS CH
1.0000 | ORAL_TABLET | Freq: Every day | ORAL | Status: DC
  Administered 2024-01-21 – 2024-01-23 (×3): 1 via ORAL
  Filled 2024-01-20 (×3): qty 1

## 2024-01-20 MED ORDER — PROCHLORPERAZINE EDISYLATE 10 MG/2ML IJ SOLN
5.0000 mg | Freq: Four times a day (QID) | INTRAMUSCULAR | Status: DC | PRN
  Administered 2024-01-20: 5 mg via INTRAVENOUS
  Filled 2024-01-20: qty 2

## 2024-01-20 MED ORDER — THIAMINE MONONITRATE 100 MG PO TABS
100.0000 mg | ORAL_TABLET | Freq: Every day | ORAL | Status: DC
  Administered 2024-01-21 – 2024-01-23 (×3): 100 mg via ORAL
  Filled 2024-01-20 (×3): qty 1

## 2024-01-20 MED ORDER — PANTOPRAZOLE SODIUM 40 MG PO TBEC: 40.0000 mg | DELAYED_RELEASE_TABLET | Freq: Every day | ORAL | Status: DC

## 2024-01-20 MED ORDER — POLYETHYLENE GLYCOL 3350 17 G PO PACK: 17.0000 g | PACK | Freq: Every day | ORAL | Status: DC | PRN

## 2024-01-20 MED ORDER — LORAZEPAM 2 MG/ML IJ SOLN
1.0000 mg | INTRAMUSCULAR | Status: DC | PRN
  Administered 2024-01-22: 1 mg via INTRAVENOUS
  Filled 2024-01-20: qty 1

## 2024-01-20 MED ORDER — PANTOPRAZOLE SODIUM 40 MG IV SOLR
40.0000 mg | Freq: Once | INTRAVENOUS | Status: AC
  Administered 2024-01-20: 40 mg via INTRAVENOUS
  Filled 2024-01-20: qty 10

## 2024-01-20 MED ORDER — OXYCODONE HCL 5 MG PO TABS
5.0000 mg | ORAL_TABLET | ORAL | Status: DC | PRN
  Administered 2024-01-21 – 2024-01-22 (×2): 10 mg via ORAL
  Filled 2024-01-20 (×2): qty 2

## 2024-01-20 MED ORDER — HYDROMORPHONE HCL 1 MG/ML IJ SOLN
0.5000 mg | INTRAMUSCULAR | Status: AC | PRN
  Administered 2024-01-21 – 2024-01-22 (×6): 0.5 mg via INTRAVENOUS
  Filled 2024-01-20 (×6): qty 0.5

## 2024-01-20 MED ORDER — ONDANSETRON HCL 4 MG/2ML IJ SOLN
4.0000 mg | Freq: Once | INTRAMUSCULAR | Status: AC
  Administered 2024-01-20: 4 mg via INTRAVENOUS
  Filled 2024-01-20: qty 2

## 2024-01-20 MED ORDER — HYDROMORPHONE HCL 1 MG/ML IJ SOLN
1.0000 mg | Freq: Once | INTRAMUSCULAR | Status: AC
  Administered 2024-01-20: 1 mg via INTRAVENOUS
  Filled 2024-01-20: qty 1

## 2024-01-20 MED ORDER — THIAMINE HCL 100 MG/ML IJ SOLN
100.0000 mg | Freq: Every day | INTRAMUSCULAR | Status: DC
  Filled 2024-01-20: qty 2

## 2024-01-20 NOTE — ED Provider Notes (Signed)
 [place] [facility] AT [facility] Provider Note   CSN: [id number] Arrival date & time: 01/20/24  2000     Patient presents with: No chief complaint on file.   Bryan Edwards is a 28 y.o. male.   Patient has a history of alcohol induced pancreatitis.  He has been drinking again and now has abdominal pain and vomiting   Abdominal Pain      Prior to Admission medications   Medication Sig Start Date End Date Taking? Authorizing Provider  acetaminophen  (TYLENOL ) 500 MG tablet Take 2 tablets (1,000 mg total) by mouth every 8 (eight) hours as needed for mild pain (pain score 1-3), fever or headache. 11/11/23   Ricky Fines, MD  folic acid  (FOLVITE ) 1 MG tablet Take 1 tablet (1 mg total) by mouth daily. 11/12/23   Ricky Fines, MD  Multiple Vitamin (MULTIVITAMIN WITH MINERALS) TABS tablet Take 1 tablet by mouth daily. 11/12/23   Ricky Fines, MD  nicotine  (NICODERM CQ  - DOSED IN MG/24 HOURS) 21 mg/24hr patch Place 1 patch (21 mg total) onto the skin daily. Patient not taking: Reported on 11/08/2023 08/08/23   Pearlean Manus, MD  ondansetron  (ZOFRAN -ODT) 8 MG disintegrating tablet Take 1 tablet (8 mg total) by mouth every 8 (eight) hours as needed. 11/11/23   Ricky Fines, MD  oxyCODONE  10 MG TABS Take 1 tablet (10 mg total) by mouth every 6 (six) hours as needed for severe pain (pain score 7-10). 11/11/23   Ricky Fines, MD  pantoprazole  (PROTONIX ) 40 MG tablet Take 1 tablet (40 mg total) by mouth 2 (two) times daily before a meal. 11/11/23   Ricky Fines, MD  polyethylene glycol (MIRALAX  / GLYCOLAX ) 17 g packet Take 17 g by mouth daily as needed for mild constipation. 11/11/23   Ricky Fines, MD  sucralfate  (CARAFATE ) 1 g tablet Take 1 tablet (1 g total) by mouth in the morning, at noon, and at bedtime. 11/11/23   Ricky Fines, MD  thiamine  (VITAMIN B-1) 100 MG tablet Take 1 tablet (100 mg total) by mouth daily. 11/12/23   Ricky Fines, MD    Allergies:  Other, Amoxicillin, Bee pollen, and Penicillins    Review of Systems  Gastrointestinal:  Positive for abdominal pain.    Updated Vital Signs BP (!) 164/119 (BP Location: Right Arm)   Pulse 87   Temp 98 F (36.7 C) (Oral)   Resp 18   SpO2 100%   Physical Exam  (all labs ordered are listed, but only abnormal results are displayed) Labs Reviewed  LIPASE, BLOOD - Abnormal; Notable for the following components:      Result Value   Lipase 151 (*)    All other components within normal limits  COMPREHENSIVE METABOLIC PANEL WITH GFR - Abnormal; Notable for the following components:   AST 56 (*)    Alkaline Phosphatase 135 (*)    All other components within normal limits  URINALYSIS, ROUTINE W REFLEX MICROSCOPIC - Abnormal; Notable for the following components:   Color, Urine COLORLESS (*)    Specific Gravity, Urine 1.003 (*)    Hgb urine dipstick MODERATE (*)    All other components within normal limits  CBC  ETHANOL    EKG: None  Radiology: CT ABDOMEN PELVIS W CONTRAST Result Date: 01/20/2024 EXAM: CT ABDOMEN AND PELVIS WITH CONTRAST 01/20/2024 09:55:16 PM TECHNIQUE: CT of the abdomen and pelvis was performed with the administration of 100 mL of iohexol  (OMNIPAQUE ) 300 MG/ML solution. Multiplanar reformatted images are provided  for review. Automated exposure control, iterative reconstruction, and/or weight-based adjustment of the mA/kV was utilized to reduce the radiation dose to as low as reasonably achievable. COMPARISON: None available. CLINICAL HISTORY: Abdominal pain, acute, nonlocalized. FINDINGS: LOWER CHEST: No acute abnormality. LIVER: The liver is unremarkable. GALLBLADDER AND BILE DUCTS: Gallbladder is unremarkable. No biliary ductal dilatation. SPLEEN: No acute abnormality. PANCREAS: Question mildly edematous proximal pancreas. No main pancreatic duct dilatation. No pseudocyst formation. No mass. ADRENAL GLANDS: No acute abnormality. KIDNEYS, URETERS AND BLADDER:  Subcentimeter hypodense lesions of the kidneys too small to characterize - no further follow-up indicated. No stones in the kidneys or ureters. No hydronephrosis. No perinephric or periureteral stranding. Urinary bladder is unremarkable. GI AND BOWEL: Stomach demonstrates no acute abnormality. Unremarkable appendix. No small or large bowel wall thickening. Fecalized material within several loops of the ileum lumen. There is no bowel obstruction. No pneumatosis. PERITONEUM AND RETROPERITONEUM: No ascites. No free air. VASCULATURE: Aorta is normal in caliber. LYMPH NODES: No lymphadenopathy. REPRODUCTIVE ORGANS: Prostate is unremarkable. BONES AND SOFT TISSUES: No acute osseous abnormality. No focal soft tissue abnormality. IMPRESSION: 1. Question mildly edematous proximal pancreas. correlate with lipase levels. Electronically signed by: Morgane Naveau MD 01/20/2024 10:29 PM EST RP Workstation: [id number]     Procedures   Medications Ordered in the ED  sodium chloride  0.9 % bolus 1,000 mL (1,000 mLs Intravenous New Bag/Given 01/20/24 2128)  ondansetron  (ZOFRAN ) injection 4 mg (4 mg Intravenous Given 01/20/24 2128)  pantoprazole  (PROTONIX ) injection 40 mg (40 mg Intravenous Given 01/20/24 2129)  iohexol  (OMNIPAQUE ) 300 MG/ML solution 100 mL (100 mLs Intravenous Contrast Given 01/20/24 2148)  HYDROmorphone  (DILAUDID ) injection 1 mg (1 mg Intravenous Given 01/20/24 2229)                                    Medical Decision Making Amount and/or Complexity of Data Reviewed Labs: ordered. Radiology: ordered.  Risk Prescription drug management. Decision regarding hospitalization.  Alcoholic pancreatitis     Final diagnoses:  Alcohol-induced acute pancreatitis without infection or necrosis    ED Discharge Orders     None          Suzette Pac, MD 01/20/24 2256

## 2024-01-20 NOTE — ED Triage Notes (Signed)
 Pt c/o abdominal pain and having emesis that started x4 days ago but worse today. Pt has hx of alcohol-induced pancreatitis. Seen here multiple times for the same. Last drink was around 3pm today.

## 2024-01-20 NOTE — H&P (Signed)
 History and Physical  [place] Loh[place] FMW:[id number] DOB: 1995-07-04 DOA: 01/20/2024  Referring physician: Dr. Suzette, EDP  PCP: Pcp, No  Outpatient Specialists: GI. Patient coming from: Home.  Chief Complaint: Abdominal pain, nausea and vomiting.  HPI: Bryan Edwards is a 28 y.o. male with medical history significant for alcoholism, current tobacco use, GERD, gastritis on PPI, history of alcohol induced pancreatitis, who presents to the ER with complaints of epigastric pain nausea vomiting for the past 4 days and worse today.  The pain is sharp and radiating to his back.  Admits to current alcohol use.  States he drank 4-5 beers prior to coming to the ER.  In the ER, hypertensive.  Lab studies notable for elevated lipase level, 151, elevated LFTs.  Alcohol level 377.    CT abdomen pelvis with contrast revealed question mildly edematous proximal pancreas correlate with lipase levels.  The patient received IV opiate-based analgesics, IV Protonix  40 mg x 1, and IV antiemetic, Zofran  4 mg x 1.  EDP requested admission for further management of acute pancreatitis.  Admitted by [facility], hospitalist service.  ED Course: Temperature 98.  BP 164/119, pulse 87, respiration rate 18, O2 saturation 100% on room air.  Review of Systems: Review of systems as noted in the HPI. All other systems reviewed and are negative.   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 No pertinent surgical history.  Social History:  reports that he has been smoking cigarettes. He does not have any smokeless tobacco history on file. He reports current alcohol use. He reports that he does not use drugs.   Allergies  Allergen Reactions   Other Other (See Comments)    Had a bad reaction to a shot in the stomach that was a blood thinner Sore R-thigh turned black and blue from shot   Amoxicillin Other (See Comments)    Makes  tongue and throat hurt, dries it out   Bee Pollen Other (See Comments)    Seasonal allergies   Penicillins Swelling    Family history: None reported.  Prior to Admission medications   Medication Sig Start Date End Date Taking? Authorizing Provider  acetaminophen  (TYLENOL ) 500 MG tablet Take 2 tablets (1,000 mg total) by mouth every 8 (eight) hours as needed for mild pain (pain score 1-3), fever or headache. 11/11/23   Ricky Fines, MD  folic acid  (FOLVITE ) 1 MG tablet Take 1 tablet (1 mg total) by mouth daily. 11/12/23   Ricky Fines, MD  Multiple Vitamin (MULTIVITAMIN WITH MINERALS) TABS tablet Take 1 tablet by mouth daily. 11/12/23   Ricky Fines, MD  nicotine  (NICODERM CQ  - DOSED IN MG/24 HOURS) 21 mg/24hr patch Place 1 patch (21 mg total) onto the skin daily. Patient not taking: Reported on 11/08/2023 08/08/23   Pearlean Manus, MD  ondansetron  (ZOFRAN -ODT) 8 MG disintegrating tablet Take 1 tablet (8 mg total) by mouth every 8 (eight) hours as needed. 11/11/23   Ricky Fines, MD  oxyCODONE  10 MG TABS Take 1 tablet (10 mg total) by mouth every 6 (six) hours as needed for severe pain (pain score 7-10). 11/11/23   Ricky Fines, MD  pantoprazole  (PROTONIX ) 40 MG tablet Take 1 tablet (40 mg total) by mouth 2 (two) times daily before a meal. 11/11/23   Ricky Fines, MD  polyethylene glycol (MIRALAX  / GLYCOLAX ) 17 g packet Take 17 g by mouth daily  as needed for mild constipation. 11/11/23   Ricky Fines, MD  sucralfate  (CARAFATE ) 1 g tablet Take 1 tablet (1 g total) by mouth in the morning, at noon, and at bedtime. 11/11/23   Ricky Fines, MD  thiamine  (VITAMIN B-1) 100 MG tablet Take 1 tablet (100 mg total) by mouth daily. 11/12/23   Ricky Fines, MD    Physical Exam: BP (!) 164/119 (BP Location: Right Arm)   Pulse 87   Temp 98 F (36.7 C) (Oral)   Resp 18   SpO2 100%   General: 28 y.o. year-old male well developed well nourished in no acute distress.  Alert and oriented  x3. Cardiovascular: Regular rate and rhythm with no rubs or gallops.  No thyromegaly or JVD noted.  No lower extremity edema. 2/4 pulses in all 4 extremities. Respiratory: Clear to auscultation with no wheezes or rales. Good inspiratory effort. Abdomen: Soft epigastric tenderness, nondistended with normal bowel sounds x4 quadrants. Muskuloskeletal: No cyanosis, clubbing or edema noted bilaterally Neuro: CN II-XII intact, strength, sensation, reflexes Skin: No ulcerative lesions noted or rashes Psychiatry: Judgement and insight appear normal. Mood is appropriate for condition and setting          Labs on Admission:  Basic Metabolic Panel: Recent Labs  Lab 01/20/24 2029  NA 144  K 4.1  CL 104  CO2 27  GLUCOSE 86  BUN 11  CREATININE 0.89  CALCIUM 8.9   Liver Function Tests: Recent Labs  Lab 01/20/24 2029  AST 56*  ALT 32  ALKPHOS 135*  BILITOT 0.4  PROT 7.0  ALBUMIN 4.8   Recent Labs  Lab 01/20/24 2029  LIPASE 151*   No results for input(s): AMMONIA in the last 168 hours. CBC: Recent Labs  Lab 01/20/24 2029  WBC 8.1  HGB 16.2  HCT 46.3  MCV 93.0  PLT 235   Cardiac Enzymes: No results for input(s): CKTOTAL, CKMB, CKMBINDEX, TROPONINI in the last 168 hours.  BNP (last 3 results) No results for input(s): BNP in the last 8760 hours.  ProBNP (last 3 results) No results for input(s): PROBNP in the last 8760 hours.  CBG: No results for input(s): GLUCAP in the last 168 hours.  Radiological Exams on Admission: CT ABDOMEN PELVIS W CONTRAST Result Date: 01/20/2024 EXAM: CT ABDOMEN AND PELVIS WITH CONTRAST 01/20/2024 09:55:16 PM TECHNIQUE: CT of the abdomen and pelvis was performed with the administration of 100 mL of iohexol  (OMNIPAQUE ) 300 MG/ML solution. Multiplanar reformatted images are provided for review. Automated exposure control, iterative reconstruction, and/or weight-based adjustment of the mA/kV was utilized to reduce the radiation dose  to as low as reasonably achievable. COMPARISON: None available. CLINICAL HISTORY: Abdominal pain, acute, nonlocalized. FINDINGS: LOWER CHEST: No acute abnormality. LIVER: The liver is unremarkable. GALLBLADDER AND BILE DUCTS: Gallbladder is unremarkable. No biliary ductal dilatation. SPLEEN: No acute abnormality. PANCREAS: Question mildly edematous proximal pancreas. No main pancreatic duct dilatation. No pseudocyst formation. No mass. ADRENAL GLANDS: No acute abnormality. KIDNEYS, URETERS AND BLADDER: Subcentimeter hypodense lesions of the kidneys too small to characterize - no further follow-up indicated. No stones in the kidneys or ureters. No hydronephrosis. No perinephric or periureteral stranding. Urinary bladder is unremarkable. GI AND BOWEL: Stomach demonstrates no acute abnormality. Unremarkable appendix. No small or large bowel wall thickening. Fecalized material within several loops of the ileum lumen. There is no bowel obstruction. No pneumatosis. PERITONEUM AND RETROPERITONEUM: No ascites. No free air. VASCULATURE: Aorta is normal in caliber. LYMPH NODES: No lymphadenopathy. REPRODUCTIVE ORGANS:  Prostate is unremarkable. BONES AND SOFT TISSUES: No acute osseous abnormality. No focal soft tissue abnormality. IMPRESSION: 1. Question mildly edematous proximal pancreas. correlate with lipase levels. Electronically signed by: Morgane Naveau MD 01/20/2024 10:29 PM EST RP Workstation: [id number]    EKG: I independently viewed the EKG done and my findings are as followed: None available at the time of this visit.  Assessment/Plan Present on Admission:  Acute pancreatitis  Principal Problem:   Acute pancreatitis  Acute pancreatitis, likely alcohol induced Acute pancreatitis in the setting of alcohol abuse and tobacco use On admission, endorses epigastric pain radiating to his back, and tenderness with palpation, lipase level 151 Findings on CT scan suggestive of acute pancreatitis Continue IV  fluid hydration LR at 125 cc/h Pain control as needed IV antiemetics as needed Recommend complete alcohol cessation. Clear liquid diet, advance as tolerated.  Alcohol abuse with concern for alcohol withdrawal Alcohol level 377 CIWA protocol in place Multivitamin, thiamine , and folic acid  supplement Fall precautions. TOC consulted to provide resources for complete alcohol cessation.  Elevated liver chemistries secondary to alcohol abuse Monitor LFTs Avoid hepatotoxic agents Repeat CMP in the morning  History of gastritis/GERD IV PPI Protonix  twice daily Switch to oral PPI when can tolerate a solid diet.   Time: 75 minutes.   DVT prophylaxis: Subcu Lovenox  daily.  Code Status: Full code.  Family Communication: Girlfriend at bedside.  Disposition Plan: Admitted to telemetry unit.  Consults called: None.  Admission status: Observation status.   Status is: Observation    Terry LOISE Hurst MD [facility] Pager [phone]  If 7PM-7AM, please contact night-coverage [email or web address] Password [email or web address]  01/20/2024, 10:59 PM

## 2024-01-21 DIAGNOSIS — K852 Alcohol induced acute pancreatitis without necrosis or infection: Secondary | ICD-10-CM | POA: Diagnosis not present

## 2024-01-21 LAB — COMPREHENSIVE METABOLIC PANEL WITH GFR
ALT: 27 U/L (ref 0–44)
AST: 46 U/L — ABNORMAL HIGH (ref 15–41)
Albumin: 4.1 g/dL (ref 3.5–5.0)
Alkaline Phosphatase: 94 U/L (ref 38–126)
Anion gap: 14 (ref 5–15)
BUN: 10 mg/dL (ref 6–20)
CO2: 23 mmol/L (ref 22–32)
Calcium: 8.1 mg/dL — ABNORMAL LOW (ref 8.9–10.3)
Chloride: 107 mmol/L (ref 98–111)
Creatinine, Ser: 0.67 mg/dL (ref 0.61–1.24)
GFR, Estimated: >60 mL/min (ref >60)
Glucose, Bld: 80 mg/dL (ref 70–99)
Potassium: 3.8 mmol/L (ref 3.5–5.1)
Sodium: 144 mmol/L (ref 135–145)
Total Bilirubin: 0.3 mg/dL (ref 0.0–1.2)
Total Protein: 6 g/dL — ABNORMAL LOW (ref 6.5–8.1)

## 2024-01-21 LAB — CBC
HCT: 41.8 % (ref 39.0–52.0)
Hemoglobin: 14.4 g/dL (ref 13.0–17.0)
MCH: 32.3 pg (ref 26.0–34.0)
MCHC: 34.4 g/dL (ref 30.0–36.0)
MCV: 93.7 fL (ref 80.0–100.0)
Platelets: 186 K/uL (ref 150–400)
RBC: 4.46 MIL/uL (ref 4.22–5.81)
RDW: 13.2 % (ref 11.5–15.5)
WBC: 5.9 K/uL (ref 4.0–10.5)
nRBC: 0 % (ref 0.0–0.2)

## 2024-01-21 LAB — PHOSPHORUS: Phosphorus: 3.8 mg/dL (ref 2.5–4.6)

## 2024-01-21 LAB — HIV ANTIBODY (ROUTINE TESTING W REFLEX): HIV Screen 4th Generation wRfx: NONREACTIVE

## 2024-01-21 LAB — LIPASE, BLOOD: Lipase: 67 U/L — ABNORMAL HIGH (ref 11–51)

## 2024-01-21 LAB — MAGNESIUM: Magnesium: 2.1 mg/dL (ref 1.7–2.4)

## 2024-01-21 MED ORDER — NICOTINE 14 MG/24HR TD PT24
14.0000 mg | MEDICATED_PATCH | Freq: Every day | TRANSDERMAL | Status: DC
  Administered 2024-01-21 – 2024-01-23 (×3): 14 mg via TRANSDERMAL
  Filled 2024-01-21 (×3): qty 1

## 2024-01-21 NOTE — ED Notes (Signed)
 Writer called 3rd floor to notify that patient will be coming upstairs shortly. Opportunity given to ask questions.

## 2024-01-21 NOTE — Progress Notes (Signed)
  Transition of Care [facility]) Screening Note   Patient Details  Name: Bryan Edwards Date of Birth: December 30, 1995   Transition of Care [facility]) CM/SW Contact:    Hoy DELENA Bigness, LCSW Phone Number: 01/21/2024, 9:24 AM    Resources for alcohol/substance use have been placed on pt's AVS.    01/21/24 0923  TOC Brief Assessment  Insurance and Status Reviewed  Patient has primary care physician No  Home environment has been reviewed Home w/ mother  Prior level of function: Independent  Prior/Current Home Services No current home services  Social Drivers of Health Review SDOH reviewed no interventions necessary  Readmission risk has been reviewed Yes  Transition of care needs no transition of care needs at this time

## 2024-01-21 NOTE — Progress Notes (Signed)
 Patient arrived to the floor via stretcher. He was able to ambulate to the bed. The nurse stated he got compazine prior to coming to the floor. Vital signs were obtained and with his anxiety and nausea his CIWA scored a 7. He denies headache at this time.  Ativan  given per order.

## 2024-01-21 NOTE — Plan of Care (Signed)
  Problem: Education: Goal: Knowledge of General Education information will improve Description: Including pain rating scale, medication(s)/side effects and non-pharmacologic comfort measures Outcome: Progressing   Problem: Health Behavior/Discharge Planning: Goal: Ability to manage health-related needs will improve Outcome: Not Met (add Reason)   Problem: Clinical Measurements: Goal: Ability to maintain clinical measurements within normal limits will improve Outcome: Not Met (add Reason)   Problem: Nutrition: Goal: Adequate nutrition will be maintained Outcome: Not Met (add Reason)

## 2024-01-21 NOTE — Plan of Care (Signed)
   Problem: Education: Goal: Knowledge of General Education information will improve Description Including pain rating scale, medication(s)/side effects and non-pharmacologic comfort measures Outcome: Progressing   Problem: Health Behavior/Discharge Planning: Goal: Ability to manage health-related needs will improve Outcome: Progressing

## 2024-01-21 NOTE — Progress Notes (Signed)
   01/21/24 1849  Vitals  BP (!) 126/110  MAP (mmHg) 108  Pulse Rate 97  MEWS COLOR  MEWS Score Color Green  MEWS Score  MEWS Temp 0  MEWS Systolic 0  MEWS Pulse 0  MEWS RR 0  MEWS LOC 0  MEWS Score 0   MD Rashid aware.

## 2024-01-21 NOTE — Progress Notes (Signed)
 PROGRESS NOTE    Bryan Edwards  FMW:[id number] DOB: 1995/02/20 DOA: 01/20/2024 PCP: Pcp, No   Brief Narrative:   28 y.o. male with medical history significant for alcoholism, current tobacco use, GERD, gastritis on PPI, history of alcohol induced pancreatitis, who presents to the ER with complaints of epigastric pain, nausea and vomiting.  Admitted for management of acute alcoholic pancreatitis.    Assessment & Plan:  Principal Problem:   Acute pancreatitis    Acute alcoholic pancreatitis,POA: Findings on CT scan suggestive of acute pancreatitis Continue IV fluid hydration LR at 125 cc/h Pain control as needed IV antiemetics as needed Recommended complete alcohol cessation. Clear liquid diet, advance as tolerated.   Alcohol abuse,POA:  Alcohol level 377 CIWA protocol in place Multivitamin, thiamine , and folic acid  supplement Fall precautions. TOC consulted to provide resources for complete alcohol cessation.   Elevated liver chemistries secondary to alcohol abuse Monitor LFTs Avoid hepatotoxic agents Repeat CMP in the morning   History of gastritis/GERD IV PPI Protonix  twice daily Switch to oral PPI when can tolerate a solid diet.  Disposition: Home. Lives with his mother and is IADL.  DVT prophylaxis: enoxaparin  (LOVENOX ) injection 40 mg Start: 01/21/24 1000     Code Status: Full Code Family Communication:  None at the bedside Status is: Observation The patient remains OBS appropriate and will d/c before 2 midnights.    Subjective:  Complaining of 8/10 epigastric pain. He is on a clear liquid diet and we discussed about continuing it until tomorrow. He had multiple episodes of acute alcoholic pancreatitis in the past. He lives with his mother and drinks 5-6 beers per day.  Examination:  General exam: Appears calm and comfortable  Respiratory system: Clear to auscultation. Respiratory effort normal. Cardiovascular system: S1 & S2 heard, RRR. No JVD,  murmurs, rubs, gallops or clicks. No pedal edema. Gastrointestinal system: Tenderness to palpation over epigastric region. No organomegaly or masses felt. Normal bowel sounds heard. Central nervous system: Alert and oriented. No focal neurological deficits. Extremities: Symmetric 5 x 5 power. Skin: No rashes, lesions or ulcers Psychiatry: Judgement and insight appear normal. Mood & affect appropriate.       Diet Orders (From admission, onward)     Start     Ordered   01/20/24 2258  Diet clear liquid Room service appropriate? Yes; Fluid consistency: Thin  Diet effective now       Question Answer Comment  Room service appropriate? Yes   Fluid consistency: Thin      01/20/24 2258            Objective: Vitals:   01/20/24 2321 01/21/24 0023 01/21/24 0341 01/21/24 0854  BP: (!) 147/112 (!) 143/118 (!) 125/93 (!) 132/103  Pulse: 86 87 61 98  Resp: 16 16 16    Temp:  97.8 F (36.6 C) 97.6 F (36.4 C)   TempSrc:  Oral Oral   SpO2: 99% 98% 98%   Weight:   65.1 kg     Intake/Output Summary (Last 24 hours) at 01/21/2024 0858 Last data filed at 01/21/2024 0400 Gross per 24 hour  Intake 1405.44 ml  Output --  Net 1405.44 ml   Filed Weights   01/21/24 0341  Weight: 65.1 kg    Scheduled Meds:  enoxaparin  (LOVENOX ) injection  40 mg Subcutaneous Q24H   folic acid   1 mg Oral Daily   multivitamin with minerals  1 tablet Oral Daily   pantoprazole  (PROTONIX ) IV  40 mg Intravenous BID   thiamine   100 mg Oral Daily   Or   thiamine   100 mg Intravenous Daily   Continuous Infusions:  lactated ringers  125 mL/hr at 01/21/24 0045    Nutritional status     Body mass index is 20.59 kg/m.  Data Reviewed:   CBC: Recent Labs  Lab 01/20/24 2029 01/21/24 0442  WBC 8.1 5.9  HGB 16.2 14.4  HCT 46.3 41.8  MCV 93.0 93.7  PLT 235 186   Basic Metabolic Panel: Recent Labs  Lab 01/20/24 2029 01/21/24 0442  NA 144 144  K 4.1 3.8  CL 104 107  CO2 27 23  GLUCOSE 86 80  BUN  11 10  CREATININE 0.89 0.67  CALCIUM 8.9 8.1*  MG  --  2.1  PHOS  --  3.8   GFR: Estimated Creatinine Clearance: 124.3 mL/min (by C-G formula based on SCr of 0.67 mg/dL). Liver Function Tests: Recent Labs  Lab 01/20/24 2029 01/21/24 0442  AST 56* 46*  ALT 32 27  ALKPHOS 135* 94  BILITOT 0.4 0.3  PROT 7.0 6.0*  ALBUMIN 4.8 4.1   Recent Labs  Lab 01/20/24 2029 01/21/24 0442  LIPASE 151* 67*   No results for input(s): AMMONIA in the last 168 hours. Coagulation Profile: No results for input(s): INR, PROTIME in the last 168 hours. Cardiac Enzymes: No results for input(s): CKTOTAL, CKMB, CKMBINDEX, TROPONINI in the last 168 hours. BNP (last 3 results) No results for input(s): PROBNP in the last 8760 hours. HbA1C: No results for input(s): HGBA1C in the last 72 hours. CBG: No results for input(s): GLUCAP in the last 168 hours. Lipid Profile: No results for input(s): CHOL, HDL, LDLCALC, TRIG, CHOLHDL, LDLDIRECT in the last 72 hours. Thyroid Function Tests: No results for input(s): TSH, T4TOTAL, FREET4, T3FREE, THYROIDAB in the last 72 hours. Anemia Panel: No results for input(s): VITAMINB12, FOLATE, FERRITIN, TIBC, IRON, RETICCTPCT in the last 72 hours. Sepsis Labs: No results for input(s): PROCALCITON, LATICACIDVEN in the last 168 hours.  No results found for this or any previous visit (from the past 240 hours).       Radiology Studies: CT ABDOMEN PELVIS W CONTRAST Result Date: 01/20/2024 EXAM: CT ABDOMEN AND PELVIS WITH CONTRAST 01/20/2024 09:55:16 PM TECHNIQUE: CT of the abdomen and pelvis was performed with the administration of 100 mL of iohexol  (OMNIPAQUE ) 300 MG/ML solution. Multiplanar reformatted images are provided for review. Automated exposure control, iterative reconstruction, and/or weight-based adjustment of the mA/kV was utilized to reduce the radiation dose to as low as reasonably achievable.  COMPARISON: None available. CLINICAL HISTORY: Abdominal pain, acute, nonlocalized. FINDINGS: LOWER CHEST: No acute abnormality. LIVER: The liver is unremarkable. GALLBLADDER AND BILE DUCTS: Gallbladder is unremarkable. No biliary ductal dilatation. SPLEEN: No acute abnormality. PANCREAS: Question mildly edematous proximal pancreas. No main pancreatic duct dilatation. No pseudocyst formation. No mass. ADRENAL GLANDS: No acute abnormality. KIDNEYS, URETERS AND BLADDER: Subcentimeter hypodense lesions of the kidneys too small to characterize - no further follow-up indicated. No stones in the kidneys or ureters. No hydronephrosis. No perinephric or periureteral stranding. Urinary bladder is unremarkable. GI AND BOWEL: Stomach demonstrates no acute abnormality. Unremarkable appendix. No small or large bowel wall thickening. Fecalized material within several loops of the ileum lumen. There is no bowel obstruction. No pneumatosis. PERITONEUM AND RETROPERITONEUM: No ascites. No free air. VASCULATURE: Aorta is normal in caliber. LYMPH NODES: No lymphadenopathy. REPRODUCTIVE ORGANS: Prostate is unremarkable. BONES AND SOFT TISSUES: No acute osseous abnormality. No focal soft tissue abnormality. IMPRESSION: 1. Question mildly  edematous proximal pancreas. correlate with lipase levels. Electronically signed by: Morgane Naveau MD 01/20/2024 10:29 PM EST RP Workstation: [id number]           LOS: 0 days   Time spent= 39 mins    Deliliah Room, MD [facility]  If 7PM-7AM, please contact night-coverage  01/21/2024, 8:58 AM

## 2024-01-22 DIAGNOSIS — K852 Alcohol induced acute pancreatitis without necrosis or infection: Secondary | ICD-10-CM | POA: Diagnosis not present

## 2024-01-22 MED ORDER — HYDROMORPHONE HCL 1 MG/ML IJ SOLN
0.5000 mg | INTRAMUSCULAR | Status: DC | PRN
  Administered 2024-01-22 (×4): 0.5 mg via INTRAVENOUS
  Filled 2024-01-22 (×4): qty 0.5

## 2024-01-22 NOTE — Progress Notes (Signed)
 PROGRESS NOTE    Bryan Edwards  FMW:[id number] DOB: 23-May-1995 DOA: 01/20/2024 PCP: Pcp, No   Brief Narrative:   28 y.o. male with medical history significant for alcoholism, current tobacco use, GERD, gastritis on PPI, history of alcohol induced pancreatitis, who presents to the ER with complaints of epigastric pain, nausea and vomiting.  Admitted for management of acute alcoholic pancreatitis.    Diet advanced to a full liquid diet on 12/5.  Assessment & Plan:  Principal Problem:   Acute pancreatitis    Acute alcoholic pancreatitis,POA: Findings on CT scan suggestive of acute pancreatitis Continue IV fluid hydration LR at 125 cc/h Pain control as needed IV antiemetics as needed Recommended complete alcohol cessation. Started on a full liquid diet today, advance as tolerated.   Alcohol abuse,POA:  Alcohol level 377 CIWA protocol in place Multivitamin, thiamine , and folic acid  supplement Fall precautions. TOC consulted to provide resources for complete alcohol cessation.   Elevated liver chemistries secondary to alcohol abuse Monitor LFTs Avoid hepatotoxic agents Repeat CMP in the morning   History of gastritis/GERD IV PPI Protonix  twice daily Switch to oral PPI when can tolerate a solid diet.  Disposition: Home. Lives with his mother and is IADL.  DVT prophylaxis: enoxaparin  (LOVENOX ) injection 40 mg Start: 01/21/24 1000     Code Status: Full Code Family Communication:  None at the bedside Status is: Observation The patient remains OBS appropriate and will d/c before 2 midnights.    Subjective:  His abdominal pain is improved today.  Rating it at 7 out of 10 today.  We spoke about advancing his diet to a full liquid diet and he is agreeable to that.  Examination:  General exam: Appears calm and comfortable  Respiratory system: Clear to auscultation. Respiratory effort normal. Cardiovascular system: S1 & S2 heard, RRR. No JVD, murmurs, rubs, gallops or  clicks. No pedal edema. Gastrointestinal system: Tenderness to palpation over epigastric region. No organomegaly or masses felt. Normal bowel sounds heard. Central nervous system: Alert and oriented. No focal neurological deficits. Extremities: Symmetric 5 x 5 power. Skin: No rashes, lesions or ulcers Psychiatry: Judgement and insight appear normal. Mood & affect appropriate.       Diet Orders (From admission, onward)     Start     Ordered   01/22/24 0736  Diet full liquid Room service appropriate? Yes; Fluid consistency: Thin  Diet effective now       Question Answer Comment  Room service appropriate? Yes   Fluid consistency: Thin      01/22/24 0735            Objective: Vitals:   01/21/24 1849 01/21/24 1900 01/22/24 0035 01/22/24 0452  BP: (!) 126/110 (!) 148/123 (!) 136/106 (!) 141/115  Pulse: 97 98 87   Resp:  18    Temp:  98.5 F (36.9 C)  97.6 F (36.4 C)  TempSrc:  Oral  Oral  SpO2:  96%  100%  Weight:        Intake/Output Summary (Last 24 hours) at 01/22/2024 0817 Last data filed at 01/22/2024 0600 Gross per 24 hour  Intake 2263.05 ml  Output --  Net 2263.05 ml   Filed Weights   01/21/24 0341  Weight: 65.1 kg    Scheduled Meds:  enoxaparin  (LOVENOX ) injection  40 mg Subcutaneous Q24H   folic acid   1 mg Oral Daily   multivitamin with minerals  1 tablet Oral Daily   nicotine   14 mg Transdermal Daily  pantoprazole  (PROTONIX ) IV  40 mg Intravenous BID   thiamine   100 mg Oral Daily   Or   thiamine   100 mg Intravenous Daily   Continuous Infusions:    Nutritional status     Body mass index is 20.59 kg/m.  Data Reviewed:   CBC: Recent Labs  Lab 01/20/24 2029 01/21/24 0442  WBC 8.1 5.9  HGB 16.2 14.4  HCT 46.3 41.8  MCV 93.0 93.7  PLT 235 186   Basic Metabolic Panel: Recent Labs  Lab 01/20/24 2029 01/21/24 0442  NA 144 144  K 4.1 3.8  CL 104 107  CO2 27 23  GLUCOSE 86 80  BUN 11 10  CREATININE 0.89 0.67  CALCIUM 8.9 8.1*   MG  --  2.1  PHOS  --  3.8   GFR: Estimated Creatinine Clearance: 124.3 mL/min (by C-G formula based on SCr of 0.67 mg/dL). Liver Function Tests: Recent Labs  Lab 01/20/24 2029 01/21/24 0442  AST 56* 46*  ALT 32 27  ALKPHOS 135* 94  BILITOT 0.4 0.3  PROT 7.0 6.0*  ALBUMIN 4.8 4.1   Recent Labs  Lab 01/20/24 2029 01/21/24 0442  LIPASE 151* 67*   No results for input(s): AMMONIA in the last 168 hours. Coagulation Profile: No results for input(s): INR, PROTIME in the last 168 hours. Cardiac Enzymes: No results for input(s): CKTOTAL, CKMB, CKMBINDEX, TROPONINI in the last 168 hours. BNP (last 3 results) No results for input(s): PROBNP in the last 8760 hours. HbA1C: No results for input(s): HGBA1C in the last 72 hours. CBG: No results for input(s): GLUCAP in the last 168 hours. Lipid Profile: No results for input(s): CHOL, HDL, LDLCALC, TRIG, CHOLHDL, LDLDIRECT in the last 72 hours. Thyroid Function Tests: No results for input(s): TSH, T4TOTAL, FREET4, T3FREE, THYROIDAB in the last 72 hours. Anemia Panel: No results for input(s): VITAMINB12, FOLATE, FERRITIN, TIBC, IRON, RETICCTPCT in the last 72 hours. Sepsis Labs: No results for input(s): PROCALCITON, LATICACIDVEN in the last 168 hours.  No results found for this or any previous visit (from the past 240 hours).       Radiology Studies: CT ABDOMEN PELVIS W CONTRAST Result Date: 01/20/2024 EXAM: CT ABDOMEN AND PELVIS WITH CONTRAST 01/20/2024 09:55:16 PM TECHNIQUE: CT of the abdomen and pelvis was performed with the administration of 100 mL of iohexol  (OMNIPAQUE ) 300 MG/ML solution. Multiplanar reformatted images are provided for review. Automated exposure control, iterative reconstruction, and/or weight-based adjustment of the mA/kV was utilized to reduce the radiation dose to as low as reasonably achievable. COMPARISON: None available. CLINICAL HISTORY:  Abdominal pain, acute, nonlocalized. FINDINGS: LOWER CHEST: No acute abnormality. LIVER: The liver is unremarkable. GALLBLADDER AND BILE DUCTS: Gallbladder is unremarkable. No biliary ductal dilatation. SPLEEN: No acute abnormality. PANCREAS: Question mildly edematous proximal pancreas. No main pancreatic duct dilatation. No pseudocyst formation. No mass. ADRENAL GLANDS: No acute abnormality. KIDNEYS, URETERS AND BLADDER: Subcentimeter hypodense lesions of the kidneys too small to characterize - no further follow-up indicated. No stones in the kidneys or ureters. No hydronephrosis. No perinephric or periureteral stranding. Urinary bladder is unremarkable. GI AND BOWEL: Stomach demonstrates no acute abnormality. Unremarkable appendix. No small or large bowel wall thickening. Fecalized material within several loops of the ileum lumen. There is no bowel obstruction. No pneumatosis. PERITONEUM AND RETROPERITONEUM: No ascites. No free air. VASCULATURE: Aorta is normal in caliber. LYMPH NODES: No lymphadenopathy. REPRODUCTIVE ORGANS: Prostate is unremarkable. BONES AND SOFT TISSUES: No acute osseous abnormality. No focal soft tissue abnormality.  IMPRESSION: 1. Question mildly edematous proximal pancreas. correlate with lipase levels. Electronically signed by: Morgane Naveau MD 01/20/2024 10:29 PM EST RP Workstation: [id number]           LOS: 1 day   Time spent= 39 mins    Deliliah Room, MD [facility]  If 7PM-7AM, please contact night-coverage  01/22/2024, 8:17 AM

## 2024-01-22 NOTE — Plan of Care (Signed)

## 2024-01-22 NOTE — TOC Initial Note (Signed)
 Transition of Care [facility]) - Initial/Assessment Note    Patient Details  Name: Bryan Edwards MRN: [id number] Date of Birth: August 21, 1995  Transition of Care [facility]) CM/SW Contact:    Lucie Lunger, LCSWA Phone Number: 01/22/2024, 8:46 AM  Clinical Narrative:                 Pt is high risk for readmission and known to [facility] from past hospital admissions. Pt is from home and lives with his mother. Pt has transportation when needed. SA resources have been added to AVS for pt to review at D/C. TOC to follow.       Expected Discharge Plan: Home/Self Care Barriers to Discharge: Continued Medical Work up   Patient Goals and CMS Choice Patient states their goals for this hospitalization and ongoing recovery are:: [place] home CMS Medicare.gov Compare Post Acute Care list provided to:: Patient Choice offered to / list presented to : Patient      Expected Discharge Plan and Services In-house Referral: Clinical Social Work Discharge Planning Services: CM Consult   Living arrangements for the past 2 months: Single Family Home                                      Prior Living Arrangements/Services Living arrangements for the past 2 months: Single Family Home Lives with:: Relatives Patient language and need for interpreter reviewed:: Yes Do you feel safe going back to the place where you live?: Yes      Need for Family Participation in Patient Care: Yes (Comment) Care giver support system in place?: Yes (comment)   Criminal Activity/Legal Involvement Pertinent to Current Situation/Hospitalization: No - Comment as needed  Activities of Daily Living   ADL Screening (condition at time of admission) Independently performs ADLs?: Yes (appropriate for developmental age) Is the patient deaf or have difficulty hearing?: No Does the patient have difficulty seeing, even when wearing glasses/contacts?: No Does the patient have difficulty concentrating, remembering, or making decisions?:  No  Permission Sought/Granted                  Emotional Assessment Appearance:: Appears stated age Attitude/Demeanor/Rapport: Engaged Affect (typically observed): Accepting Orientation: : Oriented to Self, Oriented to Place, Oriented to  Time, Oriented to Situation Alcohol / Substance Use: Not Applicable Psych Involvement: No (comment)  Admission diagnosis:  Acute pancreatitis [K85.90] Alcohol-induced acute pancreatitis without infection or necrosis [K85.20] Patient Active Problem List   Diagnosis Date Noted   Acute pancreatitis 01/20/2024   Gastroesophageal reflux disease 11/11/2023   Nausea & vomiting 08/03/2023   Pancreatitis 08/02/2023   Acute recurrent pancreatitis 05/28/2023   Hypokalemia 05/28/2023   Abdominal pain, epigastric 04/18/2023   Intussusception (HCC) 04/17/2023   Alcohol abuse 04/17/2023   Acute alcoholic pancreatitis 04/16/2023   Alcohol withdrawal (HCC) 01/09/2023   Tobacco abuse 01/09/2023   PCP:  Pcp, No Pharmacy:   [id number] - [place], [place] - [address] AT [facility] [address] [place] [address] Phone: [phone] Fax: [phone]     Social Drivers of Health (SDOH) Social History: SDOH Screenings   Food Insecurity: No Food Insecurity (01/21/2024)  Housing: Low Risk  (01/21/2024)  Transportation Needs: No Transportation Needs (01/21/2024)  Utilities: Not At Risk (01/21/2024)  Tobacco Use: High Risk (01/20/2024)   SDOH Interventions:     Readmission Risk Interventions    01/22/2024    8:39 AM  11/09/2023    8:50 AM 08/06/2023    9:40 AM  Readmission Risk Prevention Plan  Transportation Screening Complete Complete Complete  Home Care Screening   Complete  Medication Review (RN CM)   Complete  HRI or Home Care Consult Complete Complete   Social Work Consult for Recovery Care Planning/Counseling Complete Complete   Palliative Care Screening Not Applicable Not Applicable   Medication Review Furniture Conservator/restorer) Complete Complete

## 2024-01-23 DIAGNOSIS — K852 Alcohol induced acute pancreatitis without necrosis or infection: Secondary | ICD-10-CM | POA: Diagnosis not present

## 2024-01-23 MED ORDER — OXYCODONE HCL 5 MG PO TABS: 5.0000 mg | ORAL_TABLET | Freq: Three times a day (TID) | ORAL | 0 refills | Status: AC | PRN

## 2024-01-23 MED ORDER — NICOTINE 14 MG/24HR TD PT24: 14.0000 mg | MEDICATED_PATCH | Freq: Every day | TRANSDERMAL | 0 refills | Status: AC

## 2024-01-23 MED ORDER — FOLIC ACID 1 MG PO TABS: 1.0000 mg | ORAL_TABLET | Freq: Every day | ORAL | 0 refills | Status: AC

## 2024-01-23 MED ORDER — HYDROMORPHONE HCL 1 MG/ML IJ SOLN
1.0000 mg | INTRAMUSCULAR | Status: DC | PRN
  Administered 2024-01-23 (×2): 1 mg via INTRAVENOUS
  Filled 2024-01-23 (×2): qty 1

## 2024-01-23 NOTE — Plan of Care (Signed)
  Problem: Education: Goal: Knowledge of General Education information will improve Description: Including pain rating scale, medication(s)/side effects and non-pharmacologic comfort measures Outcome: Progressing   Problem: Health Behavior/Discharge Planning: Goal: Ability to manage health-related needs will improve Outcome: Progressing   Problem: Clinical Measurements: Goal: Ability to maintain clinical measurements within normal limits will improve Outcome: Progressing   Problem: Activity: Goal: Risk for activity intolerance will decrease Outcome: Progressing   Problem: Coping: Goal: Level of anxiety will decrease Outcome: Progressing   Problem: Pain Managment: Goal: General experience of comfort will improve and/or be controlled Outcome: Progressing   Problem: Safety: Goal: Ability to remain free from injury will improve Outcome: Progressing

## 2024-01-23 NOTE — Plan of Care (Signed)

## 2024-01-23 NOTE — Plan of Care (Signed)
  Problem: Education: Goal: Knowledge of General Education information will improve Description: Including pain rating scale, medication(s)/side effects and non-pharmacologic comfort measures 01/23/2024 1034 by Delores Kirsch, RN Outcome: Adequate for Discharge 01/23/2024 0906 by Delores Kirsch, RN Outcome: Progressing   Problem: Health Behavior/Discharge Planning: Goal: Ability to manage health-related needs will improve 01/23/2024 1034 by Delores Kirsch, RN Outcome: Adequate for Discharge 01/23/2024 0906 by Delores Kirsch, RN Outcome: Progressing   Problem: Clinical Measurements: Goal: Ability to maintain clinical measurements within normal limits will improve 01/23/2024 1034 by Delores Kirsch, RN Outcome: Adequate for Discharge 01/23/2024 0906 by Delores Kirsch, RN Outcome: Progressing Goal: Will remain free from infection Outcome: Adequate for Discharge Goal: Diagnostic test results will improve Outcome: Adequate for Discharge Goal: Respiratory complications will improve Outcome: Adequate for Discharge Goal: Cardiovascular complication will be avoided Outcome: Adequate for Discharge   Problem: Activity: Goal: Risk for activity intolerance will decrease Outcome: Adequate for Discharge   Problem: Nutrition: Goal: Adequate nutrition will be maintained Outcome: Adequate for Discharge   Problem: Coping: Goal: Level of anxiety will decrease Outcome: Adequate for Discharge   Problem: Elimination: Goal: Will not experience complications related to bowel motility Outcome: Adequate for Discharge Goal: Will not experience complications related to urinary retention Outcome: Adequate for Discharge   Problem: Pain Managment: Goal: General experience of comfort will improve and/or be controlled Outcome: Adequate for Discharge   Problem: Safety: Goal: Ability to remain free from injury will improve Outcome: Adequate for Discharge   Problem: Skin Integrity: Goal: Risk for  impaired skin integrity will decrease Outcome: Adequate for Discharge

## 2024-01-23 NOTE — Discharge Summary (Signed)
 Physician Discharge Summary   Patient: Bryan Edwards MRN: [id number] DOB: 1995/12/02  Admit date:     01/20/2024  Discharge date: 01/23/24  Discharge Physician: Deliliah Room   PCP: Pcp, No   Recommendations at discharge:    F/u with your PCP in one week Please stop drinking alcohol  Discharge Diagnoses: Principal Problem:   Acute pancreatitis    Hospital Course:  28 y.o. male with medical history significant for alcoholism, current tobacco use, GERD, gastritis on PPI, history of alcohol induced pancreatitis, who presents to the ER with complaints of epigastric pain, nausea and vomiting.   Admitted for management of acute alcoholic pancreatitis.   Acute alcoholic pancreatitis,POA: Findings on CT scan suggestive of acute pancreatitis Received IVF Recommended complete alcohol cessation. Tolerated a soft diet prior to discharge   Alcohol abuse,POA:   Alcohol level 377 Multivitamin, thiamine , and folic acid  supplement TOC consulted to provide resources for complete alcohol cessation.   Elevated liver chemistries secondary to alcohol abuse Avoid hepatotoxic agents Advised to stop drinking alcohol   History of gastritis/GERD Continue with protonix    Disposition: Home. Lives with his mother and is IADL.     Consultants: None Procedures performed: None  Disposition: Home Diet recommendation:  Regular diet DISCHARGE MEDICATION: Allergies as of 01/23/2024       Reactions   Other Other (See Comments)   Had a bad reaction to a shot in the stomach that was a blood thinner Sore R-thigh turned black and blue from shot   Amoxicillin Other (See Comments)   Makes tongue and throat hurt, dries it out   Bee Pollen Other (See Comments)   Seasonal allergies   Penicillins Swelling        Medication List     TAKE these medications    acetaminophen  500 MG tablet Commonly known as: TYLENOL  Take 2 tablets (1,000 mg total) by mouth every 8 (eight) hours as needed for  mild pain (pain score 1-3), fever or headache.   folic acid  1 MG tablet Commonly known as: FOLVITE  Take 1 tablet (1 mg total) by mouth daily.   milk thistle 175 MG tablet Take 175 mg by mouth daily.   multivitamin with minerals Tabs tablet Take 1 tablet by mouth daily.   nicotine  14 mg/24hr patch Commonly known as: NICODERM CQ  - dosed in mg/24 hours Place 1 patch (14 mg total) onto the skin daily.   oxyCODONE  5 MG immediate release tablet Commonly known as: Roxicodone  Take 1 tablet (5 mg total) by mouth every 8 (eight) hours as needed for severe pain (pain score 7-10).   pantoprazole  40 MG tablet Commonly known as: PROTONIX  Take 1 tablet (40 mg total) by mouth 2 (two) times daily before a meal.   polyethylene glycol 17 g packet Commonly known as: MIRALAX  / GLYCOLAX  Take 17 g by mouth daily as needed for mild constipation.   sucralfate  1 g tablet Commonly known as: Carafate  Take 1 tablet (1 g total) by mouth in the morning, at noon, and at bedtime.   thiamine  100 MG tablet Commonly known as: Vitamin B-1 Take 1 tablet (100 mg total) by mouth daily.        Discharge Exam: Filed Weights   01/21/24 0341  Weight: 65.1 kg   Constitutional: NAD, calm, comfortable Eyes: PERRL, lids and conjunctivae normal ENMT: Mucous membranes are moist. Posterior pharynx clear of any exudate or lesions.Normal dentition.  Neck: normal, supple, no masses, no thyromegaly Respiratory: clear to auscultation bilaterally, no wheezing, no  crackles. Normal respiratory effort. No accessory muscle use.  Cardiovascular: Regular rate and rhythm, no murmurs / rubs / gallops. No extremity edema. 2+ pedal pulses. No carotid bruits.  Abdomen: no tenderness, no masses palpated. No hepatosplenomegaly. Bowel sounds positive.  Musculoskeletal: no clubbing / cyanosis. No joint deformity upper and lower extremities. Good ROM, no contractures. Normal muscle tone.  Skin: no rashes, lesions, ulcers. No  induration Neurologic: CN 2-12 grossly intact. Sensation intact, DTR normal. Strength 5/5 x all 4 extremities.  Psychiatric: Normal judgment and insight. Alert and oriented x 3. Normal mood.    Condition at discharge: good  The results of significant diagnostics from this hospitalization (including imaging, microbiology, ancillary and laboratory) are listed below for reference.   Imaging Studies: CT ABDOMEN PELVIS W CONTRAST Result Date: 01/20/2024 EXAM: CT ABDOMEN AND PELVIS WITH CONTRAST 01/20/2024 09:55:16 PM TECHNIQUE: CT of the abdomen and pelvis was performed with the administration of 100 mL of iohexol  (OMNIPAQUE ) 300 MG/ML solution. Multiplanar reformatted images are provided for review. Automated exposure control, iterative reconstruction, and/or weight-based adjustment of the mA/kV was utilized to reduce the radiation dose to as low as reasonably achievable. COMPARISON: None available. CLINICAL HISTORY: Abdominal pain, acute, nonlocalized. FINDINGS: LOWER CHEST: No acute abnormality. LIVER: The liver is unremarkable. GALLBLADDER AND BILE DUCTS: Gallbladder is unremarkable. No biliary ductal dilatation. SPLEEN: No acute abnormality. PANCREAS: Question mildly edematous proximal pancreas. No main pancreatic duct dilatation. No pseudocyst formation. No mass. ADRENAL GLANDS: No acute abnormality. KIDNEYS, URETERS AND BLADDER: Subcentimeter hypodense lesions of the kidneys too small to characterize - no further follow-up indicated. No stones in the kidneys or ureters. No hydronephrosis. No perinephric or periureteral stranding. Urinary bladder is unremarkable. GI AND BOWEL: Stomach demonstrates no acute abnormality. Unremarkable appendix. No small or large bowel wall thickening. Fecalized material within several loops of the ileum lumen. There is no bowel obstruction. No pneumatosis. PERITONEUM AND RETROPERITONEUM: No ascites. No free air. VASCULATURE: Aorta is normal in caliber. LYMPH NODES: No  lymphadenopathy. REPRODUCTIVE ORGANS: Prostate is unremarkable. BONES AND SOFT TISSUES: No acute osseous abnormality. No focal soft tissue abnormality. IMPRESSION: 1. Question mildly edematous proximal pancreas. correlate with lipase levels. Electronically signed by: Morgane Naveau MD 01/20/2024 10:29 PM EST RP Workstation: [id number]    Microbiology: Results for orders placed or performed during the hospital encounter of 01/09/23  Culture, blood (Routine X 2) w Reflex to ID Panel     Status: None   Collection Time: 01/11/23  9:38 PM   Specimen: Left Antecubital; Blood  Result Value Ref Range Status   Specimen Description LEFT ANTECUBITAL  Final   Special Requests   Final    BOTTLES DRAWN AEROBIC AND ANAEROBIC Blood Culture adequate volume   Culture   Final    NO GROWTH 5 DAYS Performed at [facility], [address], [place], [place] [address]    Report Status 01/16/2023 FINAL  Final  Culture, blood (Routine X 2) w Reflex to ID Panel     Status: None   Collection Time: 01/11/23  9:49 PM   Specimen: BLOOD RIGHT FOREARM  Result Value Ref Range Status   Specimen Description BLOOD RIGHT FOREARM  Final   Special Requests   Final    BOTTLES DRAWN AEROBIC AND ANAEROBIC Blood Culture adequate volume   Culture  Setup Time NO ORGANISMS SEEN ANAEROBIC BOTTLE ONLY   Final   Culture   Final    NO GROWTH 5 DAYS Performed at [facility], [address]  [place], [place] [address]    Report Status 01/16/2023 FINAL  Final    Labs: CBC: Recent Labs  Lab 01/20/24 2029 01/21/24 0442  WBC 8.1 5.9  HGB 16.2 14.4  HCT 46.3 41.8  MCV 93.0 93.7  PLT 235 186   Basic Metabolic Panel: Recent Labs  Lab 01/20/24 2029 01/21/24 0442  NA 144 144  K 4.1 3.8  CL 104 107  CO2 27 23  GLUCOSE 86 80  BUN 11 10  CREATININE 0.89 0.67  CALCIUM 8.9 8.1*  MG  --  2.1  PHOS  --  3.8   Liver Function Tests: Recent Labs  Lab 01/20/24 2029 01/21/24 0442  AST 56* 46*  ALT 32 27  ALKPHOS 135* 94   BILITOT 0.4 0.3  PROT 7.0 6.0*  ALBUMIN 4.8 4.1   CBG: No results for input(s): GLUCAP in the last 168 hours.  Discharge time spent: 40 minutes.  Signed: Deliliah Room, MD [facility] 01/23/2024

## 2024-02-04 ENCOUNTER — Other Ambulatory Visit: Payer: Self-pay

## 2024-02-04 ENCOUNTER — Encounter (HOSPITAL_COMMUNITY): Payer: Self-pay

## 2024-02-04 ENCOUNTER — Emergency Department (HOSPITAL_COMMUNITY)
Admission: EM | Admit: 2024-02-04 | Discharge: 2024-02-04 | Disposition: A | Discharge status: Home / Self Care | Source: Home / Self Care

## 2024-02-04 DIAGNOSIS — K859 Acute pancreatitis without necrosis or infection, unspecified: Secondary | ICD-10-CM | POA: Diagnosis not present

## 2024-02-04 DIAGNOSIS — R112 Nausea with vomiting, unspecified: Secondary | ICD-10-CM

## 2024-02-04 DIAGNOSIS — R109 Unspecified abdominal pain: Secondary | ICD-10-CM | POA: Diagnosis present

## 2024-02-04 LAB — CBC WITH DIFFERENTIAL/PLATELET
Abs Immature Granulocytes: 0.03 K/uL (ref 0.00–0.07)
Basophils Absolute: 0.1 K/uL (ref 0.0–0.1)
Basophils Relative: 1 %
Eosinophils Absolute: 0.2 K/uL (ref 0.0–0.5)
Eosinophils Relative: 2 %
HCT: 42.2 % (ref 39.0–52.0)
Hemoglobin: 14.6 g/dL (ref 13.0–17.0)
Immature Granulocytes: 0 %
Lymphocytes Relative: 51 %
Lymphs Abs: 3.6 K/uL (ref 0.7–4.0)
MCH: 32.2 pg (ref 26.0–34.0)
MCHC: 34.6 g/dL (ref 30.0–36.0)
MCV: 93 fL (ref 80.0–100.0)
Monocytes Absolute: 0.7 K/uL (ref 0.1–1.0)
Monocytes Relative: 9 %
Neutro Abs: 2.7 K/uL (ref 1.7–7.7)
Neutrophils Relative %: 37 %
Platelets: 282 K/uL (ref 150–400)
RBC: 4.54 MIL/uL (ref 4.22–5.81)
RDW: 12.9 % (ref 11.5–15.5)
WBC: 7.2 K/uL (ref 4.0–10.5)
nRBC: 0 % (ref 0.0–0.2)

## 2024-02-04 LAB — BASIC METABOLIC PANEL WITH GFR
Anion gap: 17 — ABNORMAL HIGH (ref 5–15)
BUN: 7 mg/dL (ref 6–20)
CO2: 20 mmol/L — ABNORMAL LOW (ref 22–32)
Calcium: 8.8 mg/dL — ABNORMAL LOW (ref 8.9–10.3)
Chloride: 99 mmol/L (ref 98–111)
Creatinine, Ser: 0.75 mg/dL (ref 0.61–1.24)
GFR, Estimated: >60 mL/min (ref >60)
Glucose, Bld: 167 mg/dL — ABNORMAL HIGH (ref 70–99)
Potassium: 3.8 mmol/L (ref 3.5–5.1)
Sodium: 136 mmol/L (ref 135–145)

## 2024-02-04 LAB — HEPATIC FUNCTION PANEL
ALT: 32 U/L (ref 0–44)
AST: 37 U/L (ref 15–41)
Albumin: 4.5 g/dL (ref 3.5–5.0)
Alkaline Phosphatase: 89 U/L (ref 38–126)
Bilirubin, Direct: 0.1 mg/dL (ref 0.0–0.2)
Indirect Bilirubin: 0.1 mg/dL — ABNORMAL LOW (ref 0.3–0.9)
Total Bilirubin: 0.2 mg/dL (ref 0.0–1.2)
Total Protein: 6.7 g/dL (ref 6.5–8.1)

## 2024-02-04 LAB — LIPASE, BLOOD: Lipase: 17 U/L (ref 11–51)

## 2024-02-04 MED ORDER — HYDROMORPHONE HCL 1 MG/ML IJ SOLN
0.5000 mg | Freq: Once | INTRAMUSCULAR | Status: AC
  Administered 2024-02-04: 01:00:00 0.5 mg via INTRAVENOUS
  Filled 2024-02-04: qty 0.5

## 2024-02-04 MED ORDER — DICYCLOMINE HCL 20 MG PO TABS: 20.0000 mg | ORAL_TABLET | Freq: Two times a day (BID) | ORAL | 0 refills | Status: AC | PRN

## 2024-02-04 MED ORDER — DICYCLOMINE HCL 10 MG/ML IM SOLN
20.0000 mg | Freq: Once | INTRAMUSCULAR | Status: AC
  Administered 2024-02-04: 02:00:00 20 mg via INTRAMUSCULAR
  Filled 2024-02-04: qty 2

## 2024-02-04 MED ORDER — ONDANSETRON HCL 4 MG/2ML IJ SOLN
4.0000 mg | Freq: Once | INTRAMUSCULAR | Status: AC
  Administered 2024-02-04: 01:00:00 4 mg via INTRAVENOUS
  Filled 2024-02-04: qty 2

## 2024-02-04 MED ORDER — ONDANSETRON HCL 4 MG PO TABS: 4.0000 mg | ORAL_TABLET | Freq: Three times a day (TID) | ORAL | 0 refills | Status: AC | PRN

## 2024-02-04 MED ORDER — LACTATED RINGERS IV BOLUS
1000.0000 mL | Freq: Once | INTRAVENOUS | Status: AC
  Administered 2024-02-04: 01:00:00 1000 mL via INTRAVENOUS

## 2024-02-04 NOTE — Discharge Instructions (Addendum)
 Stay on your Protonix  and Carafate  as prescribed.  Use Zofran  as needed for nausea and vomiting.  He was given a dose here for abdominal pain.  You can also use Tylenol  Motrin  as needed for abdominal pain.  To the clinic with your doctor next 24 hours.  It is important that you establish care with primary care in the area, and discuss with them a GI referral.

## 2024-02-04 NOTE — ED Triage Notes (Signed)
 Pt reports emesis and nausea x2 days along with upper abd pain. H/x pancreatitis

## 2024-02-04 NOTE — ED Provider Notes (Signed)
 [place] EMERGENCY DEPARTMENT AT [facility] Provider Note   CSN: [id number] Arrival date & time: 02/04/24  0009     Patient presents with: Abdominal Pain   Bryan Edwards is a 28 y.o. male.  {Add pertinent medical, surgical, social history, OB history to HPI:6526} 29 year old male presents for evaluation of abdominal pain.  He has a history of alcohol-induced pancreatitis.  States the last 3 days he has had some nausea and vomiting as well as pain that feels similar to his pancreatitis.  States it is located in the epigastric region.  Does also have gastritis and he takes Protonix  and Carafate  for this.  He denies any other symptoms or concerns.   Abdominal Pain Associated symptoms: nausea and vomiting   Associated symptoms: no chest pain, no chills, no cough, no dysuria, no fever, no hematuria, no shortness of breath and no sore throat        Prior to Admission medications  Medication Sig Start Date End Date Taking? Authorizing Provider  acetaminophen  (TYLENOL ) 500 MG tablet Take 2 tablets (1,000 mg total) by mouth every 8 (eight) hours as needed for mild pain (pain score 1-3), fever or headache. 11/11/23   Ricky Fines, MD  folic acid  (FOLVITE ) 1 MG tablet Take 1 tablet (1 mg total) by mouth daily. 01/23/24   Rashid, Farhan, MD  milk thistle 175 MG tablet Take 175 mg by mouth daily.    [provider]  Multiple Vitamin (MULTIVITAMIN WITH MINERALS) TABS tablet Take 1 tablet by mouth daily. 11/12/23   Ricky Fines, MD  nicotine  (NICODERM CQ  - DOSED IN MG/24 HOURS) 14 mg/24hr patch Place 1 patch (14 mg total) onto the skin daily. 01/23/24   Dino Antu, MD  oxyCODONE  (ROXICODONE ) 5 MG immediate release tablet Take 1 tablet (5 mg total) by mouth every 8 (eight) hours as needed for severe pain (pain score 7-10). 01/23/24 01/22/25  Rashid, Farhan, MD  pantoprazole  (PROTONIX ) 40 MG tablet Take 1 tablet (40 mg total) by mouth 2 (two) times daily before a meal.  11/11/23   Ricky Fines, MD  polyethylene glycol (MIRALAX  / GLYCOLAX ) 17 g packet Take 17 g by mouth daily as needed for mild constipation. 11/11/23   Ricky Fines, MD  sucralfate  (CARAFATE ) 1 g tablet Take 1 tablet (1 g total) by mouth in the morning, at noon, and at bedtime. 11/11/23   Ricky Fines, MD  thiamine  (VITAMIN B-1) 100 MG tablet Take 1 tablet (100 mg total) by mouth daily. 11/12/23   Ricky Fines, MD    Allergies: Other, Amoxicillin, Bee pollen, and Penicillins    Review of Systems  Constitutional:  Negative for chills and fever.  HENT:  Negative for ear pain and sore throat.   Eyes:  Negative for pain and visual disturbance.  Respiratory:  Negative for cough and shortness of breath.   Cardiovascular:  Negative for chest pain and palpitations.  Gastrointestinal:  Positive for abdominal pain, nausea and vomiting.  Genitourinary:  Negative for dysuria and hematuria.  Musculoskeletal:  Negative for arthralgias and back pain.  Skin:  Negative for color change and rash.  Neurological:  Negative for seizures and syncope.  All other systems reviewed and are negative.   Updated Vital Signs There were no vitals taken for this visit.  Physical Exam Vitals and nursing note reviewed.  Constitutional:      General: He is not in acute distress.    Appearance: He is well-developed. He is not ill-appearing.  HENT:     Head: Normocephalic and atraumatic.  Eyes:     Conjunctiva/sclera: Conjunctivae normal.  Cardiovascular:     Rate and Rhythm: Normal rate and regular rhythm.     Heart sounds: No murmur heard. Pulmonary:     Effort: Pulmonary effort is normal. No respiratory distress.     Breath sounds: Normal breath sounds.  Abdominal:     Palpations: Abdomen is soft.     Tenderness: There is generalized abdominal tenderness and tenderness in the epigastric area.  Musculoskeletal:        General: No swelling.     Cervical back: Neck supple.  Skin:    General: Skin is  warm and dry.     Capillary Refill: Capillary refill takes less than 2 seconds.  Neurological:     Mental Status: He is alert.  Psychiatric:        Mood and Affect: Mood normal.     (all labs ordered are listed, but only abnormal results are displayed) Labs Reviewed  BASIC METABOLIC PANEL WITH GFR  HEPATIC FUNCTION PANEL  LIPASE, BLOOD  CBC WITH DIFFERENTIAL/PLATELET    EKG: None  Radiology: No results found.  {Document cardiac monitor, telemetry assessment procedure when appropriate:32947} Procedures   Medications Ordered in the ED  lactated ringers  bolus 1,000 mL (has no administration in time range)  ondansetron  (ZOFRAN ) injection 4 mg (has no administration in time range)  HYDROmorphone  (DILAUDID ) injection 0.5 mg (has no administration in time range)      {Click here for ABCD2, HEART and other calculators REFRESH Note before signing:1}                              Medical Decision Making Amount and/or Complexity of Data Reviewed Labs: ordered.  Risk Prescription drug management.   ***  {Document critical care time when appropriate  Document review of labs and clinical decision tools ie CHADS2VASC2, etc  Document your independent review of radiology images and any outside records  Document your discussion with family members, caretakers and with consultants  Document social determinants of health affecting pt's care  Document your decision making why or why not admission, treatments were needed:32947:::1}   Final diagnoses:  None    ED Discharge Orders     None

## 2024-02-04 NOTE — ED Notes (Signed)
 Discharge instructions reviewed with patient. Patient questions answered and opportunity for education reviewed. Patient voices understanding of discharge instructions with no further questions. Patient ambulatory with steady gait to lobby.
